# Patient Record
Sex: Male | Born: 1945
Health system: Southern US, Community
[De-identification: ages and names within clinical notes are randomized; demographics above are authoritative.]

## PROBLEM LIST (undated history)

## (undated) DIAGNOSIS — I10 Essential (primary) hypertension: Secondary | ICD-10-CM

## (undated) DIAGNOSIS — M1990 Unspecified osteoarthritis, unspecified site: Secondary | ICD-10-CM

## (undated) DIAGNOSIS — H2510 Age-related nuclear cataract, unspecified eye: Secondary | ICD-10-CM

## (undated) DIAGNOSIS — I38 Endocarditis, valve unspecified: Secondary | ICD-10-CM

## (undated) DIAGNOSIS — I2510 Atherosclerotic heart disease of native coronary artery without angina pectoris: Secondary | ICD-10-CM

## (undated) DIAGNOSIS — E079 Disorder of thyroid, unspecified: Secondary | ICD-10-CM

## (undated) DIAGNOSIS — C73 Malignant neoplasm of thyroid gland: Secondary | ICD-10-CM

## (undated) DIAGNOSIS — Z85850 Personal history of malignant neoplasm of thyroid: Secondary | ICD-10-CM

## (undated) DIAGNOSIS — K219 Gastro-esophageal reflux disease without esophagitis: Secondary | ICD-10-CM

## (undated) DIAGNOSIS — N400 Enlarged prostate without lower urinary tract symptoms: Secondary | ICD-10-CM

## (undated) DIAGNOSIS — E039 Hypothyroidism, unspecified: Secondary | ICD-10-CM

## (undated) DIAGNOSIS — D34 Benign neoplasm of thyroid gland: Secondary | ICD-10-CM

## (undated) DIAGNOSIS — H33329 Round hole, unspecified eye: Secondary | ICD-10-CM

## (undated) DIAGNOSIS — C801 Malignant (primary) neoplasm, unspecified: Secondary | ICD-10-CM

## (undated) DIAGNOSIS — E785 Hyperlipidemia, unspecified: Secondary | ICD-10-CM

## (undated) DIAGNOSIS — K5790 Diverticulosis of intestine, part unspecified, without perforation or abscess without bleeding: Secondary | ICD-10-CM

## (undated) DIAGNOSIS — R51 Headache: Secondary | ICD-10-CM

## (undated) HISTORY — PX: KNEE ARTHROSCOPY: SHX127

## (undated) HISTORY — PX: BACK SURGERY: SHX140

## (undated) HISTORY — PX: LUMBAR MICRODISCECTOMY: SHX99

## (undated) HISTORY — PX: JOINT REPLACEMENT: SHX530

## (undated) HISTORY — PX: TOTAL THYROIDECTOMY: SHX2547

## (undated) HISTORY — PX: TURP VAPORIZATION: SUR1397

## (undated) HISTORY — PX: ANGIOPLASTY / STENTING FEMORAL: SUR30

---

## 2004-03-31 ENCOUNTER — Other Ambulatory Visit: Payer: Self-pay

## 2005-03-30 ENCOUNTER — Ambulatory Visit: Payer: Self-pay

## 2006-04-06 ENCOUNTER — Emergency Department: Payer: Self-pay | Admitting: Internal Medicine

## 2006-04-13 ENCOUNTER — Encounter: Admission: RE | Admit: 2006-04-13 | Discharge: 2006-04-13 | Payer: Self-pay | Admitting: Internal Medicine

## 2006-09-01 ENCOUNTER — Ambulatory Visit: Payer: Self-pay | Admitting: Internal Medicine

## 2007-02-03 ENCOUNTER — Ambulatory Visit: Payer: Self-pay | Admitting: Internal Medicine

## 2007-02-11 ENCOUNTER — Ambulatory Visit: Payer: Self-pay | Admitting: Gastroenterology

## 2007-02-22 ENCOUNTER — Ambulatory Visit: Payer: Self-pay | Admitting: Gastroenterology

## 2007-03-21 ENCOUNTER — Ambulatory Visit: Payer: Self-pay | Admitting: Surgery

## 2007-09-05 ENCOUNTER — Ambulatory Visit: Payer: Self-pay | Admitting: Unknown Physician Specialty

## 2008-04-23 ENCOUNTER — Ambulatory Visit: Payer: Self-pay | Admitting: Internal Medicine

## 2008-05-26 ENCOUNTER — Emergency Department: Payer: Self-pay | Admitting: Emergency Medicine

## 2010-06-22 ENCOUNTER — Emergency Department: Payer: Self-pay | Admitting: Emergency Medicine

## 2010-07-13 ENCOUNTER — Ambulatory Visit: Payer: Self-pay | Admitting: Unknown Physician Specialty

## 2010-07-15 ENCOUNTER — Inpatient Hospital Stay: Payer: Self-pay | Admitting: Unknown Physician Specialty

## 2011-01-03 ENCOUNTER — Encounter: Payer: Self-pay | Admitting: Internal Medicine

## 2011-03-02 ENCOUNTER — Ambulatory Visit: Payer: Self-pay | Admitting: Unknown Physician Specialty

## 2012-04-11 ENCOUNTER — Ambulatory Visit: Payer: Self-pay | Admitting: Orthopedic Surgery

## 2012-08-04 ENCOUNTER — Encounter (HOSPITAL_COMMUNITY): Payer: Self-pay | Admitting: Pharmacy Technician

## 2012-08-07 ENCOUNTER — Encounter (HOSPITAL_COMMUNITY)
Admission: RE | Admit: 2012-08-07 | Discharge: 2012-08-07 | Disposition: A | Discharge status: Home / Self Care | Payer: Medicare Other | Source: Ambulatory Visit | Attending: Physician Assistant | Admitting: Physician Assistant

## 2012-08-07 ENCOUNTER — Encounter (HOSPITAL_COMMUNITY): Payer: Self-pay

## 2012-08-07 ENCOUNTER — Encounter (HOSPITAL_COMMUNITY)
Admission: RE | Admit: 2012-08-07 | Discharge: 2012-08-07 | Disposition: A | Discharge status: Home / Self Care | Payer: Medicare Other | Source: Ambulatory Visit | Attending: Orthopedic Surgery | Admitting: Orthopedic Surgery

## 2012-08-07 DIAGNOSIS — M48061 Spinal stenosis, lumbar region without neurogenic claudication: Secondary | ICD-10-CM | POA: Diagnosis present

## 2012-08-07 HISTORY — DX: Headache: R51

## 2012-08-07 HISTORY — DX: Essential (primary) hypertension: I10

## 2012-08-07 HISTORY — DX: Malignant (primary) neoplasm, unspecified: C80.1

## 2012-08-07 HISTORY — DX: Hypothyroidism, unspecified: E03.9

## 2012-08-07 LAB — SURGICAL PCR SCREEN
MRSA, PCR: NEGATIVE
Staphylococcus aureus: NEGATIVE

## 2012-08-07 LAB — BASIC METABOLIC PANEL
BUN: 21 mg/dL (ref 6–23)
Calcium: 9.6 mg/dL (ref 8.4–10.5)
Creatinine, Ser: 1.24 mg/dL (ref 0.50–1.35)
GFR calc non Af Amer: 58 mL/min — ABNORMAL LOW (ref >90)
Glucose, Bld: 80 mg/dL (ref 70–99)

## 2012-08-07 LAB — CBC
Hemoglobin: 13.8 g/dL (ref 13.0–17.0)
MCH: 28.8 pg (ref 26.0–34.0)
MCHC: 33.8 g/dL (ref 30.0–36.0)
Platelets: 181 10*3/uL (ref 150–400)

## 2012-08-07 LAB — ABO/RH: ABO/RH(D): O POS

## 2012-08-07 LAB — TYPE AND SCREEN: ABO/RH(D): O POS

## 2012-08-07 NOTE — Progress Notes (Addendum)
Spoke with Sherri in Dr. Shon Baton' office requesting surgical orders.   Requested EKG, clearance note from [facility], Internal Med, [place].

## 2012-08-07 NOTE — Consult Note (Signed)
Keith Williamson 08/07/2012 8:24 AM Location: [facility] Patient #: [id number] DOB: [birth year] Married / Language: English / Race: Black or African American Male   History of Present Illness(Anokhi Shannon Dierdre Highman, PA-C; 08/07/2012 10:06 AM) The patient is a 66 year old male who comes in today for a preoperative History and Physical. The patient is scheduled for a revision decompression and insitu fusion L3-5 (recurrent spinal stenosis with L>R leg pain) to be performed by Dr. Debria Garret D. Shon Baton, MD at [facility] on Wednesday, August 16, 2012 at 0830 . Please see the hospital record for complete dictated history and physical.    Family History(Oretha Weismann J Pavan Bring, PA-C; 08/07/2012 2:08 PM) Cancer. sister father and sister Hypertension. mother, sister and brother Kidney disease. sister and brother   Social History(Tyjai Charbonnet Dierdre Highman, PA-C; 08/07/2012 2:08 PM) Alcohol use. never consumed alcohol Children. 2 Current work status. disabled Drug/Alcohol Rehab (Currently). no Drug/Alcohol Rehab (Previously). no Illicit drug use. no Living situation. live with spouse Marital status. married Pain Contract. no Tobacco / smoke exposure. no Tobacco use. former smoker; smoke(d) less than 1/2 pack(s) per day   Medication History(Leng Montesdeoca J Brandyn Thien, PA-C; 08/07/2012 11:08 AM) Doxazosin Mesylate (4MG  Tablet, 1 Oral bid) Active. (blood pressure) Levothyroxine Sodium ( Tablet, 1 Oral daily) Active. Proscar (5MG  Tablet, 1 Oral daily) Active. Edarbi (40MG  Tablet, 1 Oral daily) Active. (blood pressure) Oxycodone-Acetaminophen (5-325MG  Tablet, 1 Oral daily) Active.   Past Surgical History(Kerron Sedano J [facility], PA-C; 08/07/2012 2:08 PM) Arthroscopy of Knee. left Spinal Surgery Thyroidectomy; Total   Other Problems(Maurilio Puryear J Martino Tompson, PA-C; 08/07/2012 2:08 PM) Autoimmune disorder. sister High blood pressure   Review of Systems(Masae Lukacs J Devaney Segers, PA-C; 08/07/2012 2:08  PM) General:Not Present- Chills, Fever, Night Sweats, Appetite Loss, Fatigue, Feeling sick, Weight Gain and Weight Loss. Skin:Not Present- Itching, Rash, Skin Color Changes, Ulcer, Psoriasis and Change in Hair or Nails. HEENT:Present- Hearing problems and Ringing in the Ears. Not Present- Sensitivity to light and Nose Bleed. Neck:Not Present- Swollen Glands and Neck Mass. Respiratory:Not Present- Snoring, Chronic Cough, Bloody sputum and Dyspnea. Cardiovascular:Not Present- Shortness of Breath, Chest Pain, Swelling of Extremities, Leg Cramps and Palpitations. Gastrointestinal:Not Present- Bloody Stool, Heartburn, Abdominal Pain, Vomiting, Nausea and Incontinence of Stool. Male Genitourinary:Not Present- Blood in Urine, Frequency, Incontinence and Nocturia. Musculoskeletal:Present- Joint Pain and Back Pain. Not Present- Muscle Weakness, Muscle Pain, Joint Stiffness and Joint Swelling. Neurological:Present- Numbness and Burning. Not Present- Tingling, Tremor, Headaches and Dizziness. Psychiatric:Not Present- Anxiety, Depression and Memory Loss. Endocrine:Present- Cold Intolerance and Heat Intolerance. Not Present- Excessive hunger and Excessive Thirst. Hematology:Not Present- Abnormal Bleeding, Anemia, Blood Clots and Easy Bruising.   Vitals(Amatullah Christy J Sibley Rolison, PA-C; 08/07/2012 11:03 AM) 08/07/2012 11:03 AM BP: 160/100 (Sitting, Left Arm, Standard)  08/07/2012 10:35 AM Weight: 243 lb Height: 74 in Body Surface Area: 2.4 m Body Mass Index: 31.2 kg/m  08/07/2012 10:32 AM Pulse: 93 (Regular) BP: 151/116 (Sitting, Left Arm, Standard)    Physical Exam(Mahdiya Mossberg J Dennys Guin, PA-C; 08/07/2012 9:52 AM) The physical exam findings are as follows:   General General Appearance- pleasant. Not in acute distress. Orientation- Oriented X3. Build & Nutrition- Well nourished and Well developed. Posture- Normal posture. Gait- Normal (slight limp). Mental Status-  Alert.   Integumentary Lumbar Spine- Skin examination of the lumbar spine is without deformity, skin lesions, lacerations or abrasions (well healed surgical scar midline).   Head and Neck Neck Global Assessment- supple. no lymphadenopathy and no nucchal rigidty.   Eye Pupil- Bilateral- Normal, Direct reaction to light normal, Equal and  Regular. Motion- Bilateral- EOMI.   Chest and Lung Exam Auscultation: Breath sounds:- Clear.   Cardiovascular Auscultation:Rhythm- Regular rate and rhythm. Heart Sounds- Normal heart sounds.   Peripheral Vascular Lower Extremity:Inspection- Bilateral- Inspection Normal. Palpation:Posterior tibial pulse- Bilateral- 2+. Dorsalis pedis pulse- Bilateral- 2+.   Neurologic Sensation:Lower Extremity- Bilateral- sensation is intact in the lower extremity. Reflexes:Patellar Reflex- Bilateral- 1+. Achilles Reflex- Bilateral- 1+. Babinski- Bilateral- Babinski not present. Clonus- Bilateral- clonus not present. Testing:Seated Straight Leg Raise- Bilateral- Seated straight leg raise negative.   Musculoskeletal Spine/Ribs/Pelvis Lumbosacral Spine:Inspection and Palpation- Tenderness- generalized. bony and soft tissue palpation of the lumbar spine and SI joint does not recreate their typical pain. Strength and Tone: Strength:Hip Flexion- Bilateral- 5/5. Knee Extension- Bilateral- 5/5. Knee Flexion- Bilateral- 5/5. Ankle Dorsiflexion- Bilateral- 5/5. Ankle Plantarflexion- Bilateral- 5/5. Heel walk- Bilateral- able to heel walk without difficulty. Toe Walk- Bilateral- able to walk on toes without difficulty. Heel-Toe Walk- Bilateral- able to heel-toe walk without difficulty. ROM- Flexion- full range of motion. Extension- full range of motion. Pain:- extension is more painful than flexion. Waddell's Signs- no Waddell's signs present. Lower Extremity Range of Motion:- No true hip, knee or  ankle pain with range of motion. Gait and Station:Assistive Devices- no assistive devices.   Assessment & Plan(Trayton Szabo J [facility], PA-C; 08/07/2012 2:09 PM) Spinal Stenosis, Lumbar (724.02)  Note: unfortunately conservative measures consisting of observation, activity modification, oral pain medications and injections have failed to alleviate his symptoms and given the ongoing nature of his pain and the significant decrease in his overall quality of life, he wishes to proceed with surgery. Risks/benefits/alternatives to the procedure/expectations following the procedure have been reviewed with him by Dr. Shon Baton. The goal of surgery is to reduce, not eliminate his pain. He understands and accepts.  He has been medically cleared for the procedure by Dr. Marcello Fennel. Please see the scanned documentation in th e patients office chart. He is scheduled to complete his preop hospital requirements later this morning at [facility]. He will also be fitted for a lumbar corsett by out therapy department later today.  MRI of the lumbar spine demonstrates multifactorial multilevel spinal stenosis at both L3-4 and L4-5. Please see the report for the specifics. All of his questions have been encouraged, addressed and answered. Plan, at this time is to proceed with surgery as scheduled.  patients last BP at his PCP on 07/12/12 was 132/80. This morning it is elevated but by his report is under control with the current medication regimen. Dr. Shon Baton has asked that the patient contact his PCP to inform him of the elevation this morning.  I was contacted by the hospital RE: the patients blood pressure and they report that it is in normal range this afternoon.   Signed electronically by Gwinda Maine, PA-C (08/07/2012 2:09 PM)   Keith Williamson 07/07/2012 Location: [facility] Patient #: [phone] DOB: [birth year] Married / Language: English / Race: Black or African American Male  The patient  is a 66 year old Male.   Subjective Transcription(DAHARI Sheela Stack, MD; 07/12/2012 5:14 PM)  He called today indicating he would like to proceed with surgery. At this point in time because of the need for the in situ fusion and the fact that it is a 2 level procedure and his age, I do favor also using an external bone stimulator to augment likely the fusion. We will go ahead and submit the paperwork for his insurance company and will make sure that he has clearance from  primary care physician.      Miscellaneous Transcription(DAHARI Sheela Stack, MD; 07/12/2012 5:14 PM)  Keith Beal, MD/ghe    T: 07/10/12  D: 07/07/12      Signed electronically by Keith Beal, MD (07/12/2012 5:14 PM)   Keith Williamson 06/13/2012 10:02 AM Location: [facility] Patient #: [id number] DOB: 1945-12-21 Married / Language: English / Race: Black or African American Male   History of Present Illness(Lori Zipporah Plants; 06/13/2012 10:17 AM) The patient is a 66 year old male who presents with back pain. The patient is here today in referral from Primary at [facility] . The patient reports low back symptoms including pain and numbness which began 3 year(s) (+) ago without any known injury (diagnosised with spinal stenosis ). and Symptoms include pain (about the lower lumbar region ), numbness (about the left lower ext. from the level of the knee to ankle, medially), burning and weakness (left lower ext. ), while symptoms do not include incontinence of stool or incontinence of urine. Current treatment includes opioid analgesics (Duragesic patch per primary ), epidural injections (he has had a series of three, with the last one occuring in [phone] (no relief)) and activity modification. Past evaluation has included x-ray of the lumbar spine, MRI of the lumbar spine (performed at [facility] 03/2012), orthopedic evaluation and pain management evaluation. Past treatment has included  back surgery (left L3-4 disectomy on [id number]).    Subjective Transcription(DAHARI D BROOKS, MD; 06/16/2012 8:47 AM)  REASON FOR CONSULTATION:  Ongoing significant back and bilateral leg pain, left side worse than the right.    HISTORY OF PRESENT ILLNESS:  The patient is a very pleasant, active young man who has been having progressive debilitating back, buttock, bilateral leg pain, left worse than right for at least the last 5 years. In 2011, he had an L3-4 microdiscectomy on the left side. He states that his symptoms never really changed. He continues to have debilitating back pain which effects his ability to walk as well as radiating into the legs. He states there is a significant amount of crepitus and shooting radicular leg pain, left side worse than right. He also has significant left knee arthritis which compounds his problem by effecting his gait. He ambulates with a cane. He still has relief of symptoms with forward flexion and increased pain with extension. He has no history of incontinence of bowel and bladder.    A recent MRI done 04/11/12 demonstrates a slight anterolisthesis at L4-5 with significant spinal stenosis at L4-5 with facet hypertrophy causing lateral recess and foraminal disease as well as hard disk osteophyte and buckling of the ligamentum flavum causing central stenosis. He has a recurrent disk herniation at L3-4 on the left side with moderate to severe spinal stenosis at L3-4.      Allergies(Lori W Randa Lynn; 06/13/2012 10:17 AM) Norvasc *CALCIUM CHANNEL BLOCKERS*. intolerance Hydrochlorothiazide *DIURETICS*. intolerance   Social History(Lori W Lamb; 06/13/2012 10:17 AM) Alcohol use. never consumed alcohol Children. 2 Current work status. disabled Drug/Alcohol Rehab (Currently). no Drug/Alcohol Rehab (Previously). no Exercise. Exercises weekly; does other Illicit drug use. no Living situation. live with spouse Marital status. married Number  of flights of stairs before winded. 4-5 Pain Contract. no Tobacco / smoke exposure. no Tobacco use. former smoker; smoke(d) less than 1/2 pack(s) per day   Medication History(Lori W Lamb; 06/13/2012 10:18 AM) Robaxin (500MG  Tablet, 1 Oral po q8h prn spasms, Taken starting 06/09/2012) Active. Levothyroxine Sodium ( Oral) Specific dose unknown -  Active. Doxazosin Mesylate ( Oral) Specific dose unknown - Active. Edarbi ( Oral) Specific dose unknown - Active. Proscar (5MG  Tablet, Oral) Active. FentaNYL Citrate ( Buccal) Specific dose unknown - Active.   Past Surgical History(Lori W Lamb; 06/13/2012 10:18 AM) Arthroscopy of Knee. left Spinal Surgery Thyroidectomy; Total   Objective Transcription(DAHARI Sheela Stack, MD; 06/16/2012 8:47 AM)  He is a pleasant gentleman who appears his stated age in no acute distress. He is alert and oriented x3. He has downgoing toes on Babinski testing. Decreased reflexes at the knee and Achilles bilaterally. Compartments are soft and nontender. Intact peripheral pulses. He does have an altered gait pattern with a slight limp. He has knee pain with palpation and range of motion. Back pain with extension. He has a well healed surgical scar in the midline of the lumbar spine. No groin pain (hip pain). No shortness of breath or chest pain.      Assessment & Plan(DAHARI D BROOKS, MD; 06/13/2012 11:09 AM) Post-laminectomy Syndrome, Lumbar (722.83)  Spinal Stenosis, Lumbar (724.02)   Assessments Transcription(DAHARI D BROOKS, MD; 06/16/2012 8:47 AM)  Lumbar spinal stenosis with recurrent disk herniation.      Plans Transcription(DAHARI Sheela Stack, MD; 06/16/2012 8:47 AM)  At this point in time, we have had a long discussion about surgical intervention. If we were to do a surgery, I would recommend a revision, decompression L3-4, L4-5 with possible in situ fusion L4-5. At this point, there is degenerative disk disease at L5-S1, so I do not  favor instrumentation at the L4-5 level as this would augment stress transfer to the L5-S1 and more than likely create a symptomatic degenerative disk at L5-S1. I do believe that his predominant symptoms are consistent with spinal stenosis. By doing a decompression at L4-5 and L3-4, this would allow me to address the recurrent disk fragment at L3-4, the significant facet hypertrophy and stenosis at L3-4 and L4-5, create enough room for the nerves that his stenotic symptoms should improve. The risk of that surgery has been explained to the patient and his wife to include infection, bleeding, nerve damage, death, stroke, paralysis, ongoing or worse pain, adjacent segment disease, leak of spinal fluid, injury to the bowel and bladder and abdominal contents, and loss of bowel and bladder control. All of their questions have been addressed and encouraged. He will take some time to think about it and he will contact me and let me know how he would like to proceed.      Miscellaneous Transcription(DAHARI Sheela Stack, MD; 06/16/2012 8:47 AM)  Keith Beal, MD/ghe    T: 06/14/12  D: 06/13/12      Signed electronically by Keith Beal, MD (06/16/2012 8:58 AM)

## 2012-08-07 NOTE — Pre-Procedure Instructions (Signed)
19 Keith Williamson  08/07/2012   Your procedure is scheduled on:  Wednesday September 4  Report to [facility] at 6:30 AM.  Call this number if you have problems the morning of surgery: [phone]   Remember:   Do not eat or drink:After Midnight.    Take these medicines the morning of surgery with A SIP OF WATER: Synthroid, Proscar, Cardura   Do not wear jewelry, make-up or nail polish.  Do not wear lotions, powders, or perfumes. You may wear deodorant.  Do not shave 48 hours prior to surgery. Men may shave face and neck.  Do not bring valuables to the hospital.  Contacts, dentures or bridgework may not be worn into surgery.  Leave suitcase in the car. After surgery it may be brought to your room.  For patients admitted to the hospital, checkout time is 11:00 AM the day of discharge.   Patients discharged the day of surgery will not be allowed to drive home.  Name and phone number of your driver: NA  Special Instructions: CHG Shower Use Special Wash: 1/2 bottle night before surgery and 1/2 bottle morning of surgery.   Please read over the following fact sheets that you were given: Pain Booklet, Coughing and Deep Breathing and Surgical Site Infection Prevention

## 2012-08-08 NOTE — Consult Note (Signed)
Anesthesia Chart Review:  Patient is a 66 year old male scheduled for revision decompression and insitu fusion L 3-4 on 08/16/12 by Dr. Shon Baton.  History includes former smoker, obesity, HTN, BPH s/p TURP, headaches, thyroid cancer '07 s/p radioactive iodine with secondary hypothyroidism, prior back and knee surgeries.    PCP is Dr. Marcello Fennel at [facility] on 07/12/12 for medical clearance.  EKG then showed SR with first degree AVB, LAD.  He was ultimately cleared for this procedure with "low" risk of peri-operative complications.  By notes, patient had a relatively benign cardiac work-up in '05 following a syncopal episode.  EKG then showed SB that resolved with discontinuation of b-blocker therapy, Holter showed occasional PVC/PACs, stress was negative, and echo showed LVH.  Chest x-ray on 08/07/2012 showed no acute cardiopulmonary abnormality.  Labs noted.  If no significant change in his status then anticipate he can proceed as planned.  Shonna Chock, PA-C

## 2012-08-09 ENCOUNTER — Other Ambulatory Visit (HOSPITAL_COMMUNITY): Payer: Self-pay

## 2012-08-15 MED ORDER — CEFAZOLIN SODIUM-DEXTROSE 2-3 GM-% IV SOLR
2.0000 g | INTRAVENOUS | Status: AC
  Administered 2012-08-16: 1 g via INTRAVENOUS
  Administered 2012-08-16: 2 g via INTRAVENOUS
  Filled 2012-08-15: qty 50

## 2012-08-16 ENCOUNTER — Ambulatory Visit (HOSPITAL_COMMUNITY): Payer: Medicare Other

## 2012-08-16 ENCOUNTER — Encounter (HOSPITAL_COMMUNITY): Payer: Self-pay | Admitting: *Deleted

## 2012-08-16 ENCOUNTER — Ambulatory Visit (HOSPITAL_COMMUNITY): Payer: Medicare Other | Admitting: Vascular Surgery

## 2012-08-16 ENCOUNTER — Encounter (HOSPITAL_COMMUNITY)
Admission: RE | Disposition: A | Discharge status: Home / Self Care | Payer: Self-pay | Source: Ambulatory Visit | Attending: Orthopedic Surgery

## 2012-08-16 ENCOUNTER — Inpatient Hospital Stay (HOSPITAL_COMMUNITY)
Admission: RE | Admit: 2012-08-16 | Discharge: 2012-08-19 | DRG: 460 | Disposition: A | Discharge status: Home / Self Care | Payer: Medicare Other | Source: Ambulatory Visit | Attending: Orthopedic Surgery | Admitting: Orthopedic Surgery

## 2012-08-16 ENCOUNTER — Encounter (HOSPITAL_COMMUNITY): Payer: Self-pay | Admitting: Vascular Surgery

## 2012-08-16 DIAGNOSIS — E018 Other iodine-deficiency related thyroid disorders and allied conditions: Secondary | ICD-10-CM | POA: Diagnosis present

## 2012-08-16 DIAGNOSIS — Z79899 Other long term (current) drug therapy: Secondary | ICD-10-CM

## 2012-08-16 DIAGNOSIS — Z85850 Personal history of malignant neoplasm of thyroid: Secondary | ICD-10-CM

## 2012-08-16 DIAGNOSIS — M5126 Other intervertebral disc displacement, lumbar region: Principal | ICD-10-CM | POA: Diagnosis present

## 2012-08-16 DIAGNOSIS — I10 Essential (primary) hypertension: Secondary | ICD-10-CM | POA: Diagnosis present

## 2012-08-16 DIAGNOSIS — M48061 Spinal stenosis, lumbar region without neurogenic claudication: Secondary | ICD-10-CM

## 2012-08-16 DIAGNOSIS — Z01812 Encounter for preprocedural laboratory examination: Secondary | ICD-10-CM

## 2012-08-16 DIAGNOSIS — Z01818 Encounter for other preprocedural examination: Secondary | ICD-10-CM

## 2012-08-16 DIAGNOSIS — Q762 Congenital spondylolisthesis: Secondary | ICD-10-CM

## 2012-08-16 HISTORY — PX: LUMBAR LAMINECTOMY: SHX95

## 2012-08-16 LAB — COMPREHENSIVE METABOLIC PANEL
ALT: 20 U/L (ref 0–53)
BUN: 22 mg/dL (ref 6–23)
Calcium: 9.5 mg/dL (ref 8.4–10.5)
Creatinine, Ser: 1.45 mg/dL — ABNORMAL HIGH (ref 0.50–1.35)
GFR calc Af Amer: 56 mL/min — ABNORMAL LOW (ref >90)
GFR calc non Af Amer: 48 mL/min — ABNORMAL LOW (ref >90)
Glucose, Bld: 101 mg/dL — ABNORMAL HIGH (ref 70–99)
Sodium: 141 mEq/L (ref 135–145)
Total Protein: 7.3 g/dL (ref 6.0–8.3)

## 2012-08-16 LAB — PROTIME-INR
INR: 1.07 (ref 0.00–1.49)
Prothrombin Time: 14.1 seconds (ref 11.6–15.2)

## 2012-08-16 SURGERY — DECOMPRESSIVE LUMBAR LAMINECTOMY LEVEL 3
Anesthesia: General | Site: Back | Wound class: Clean

## 2012-08-16 MED ORDER — PHENOL 1.4 % MT LIQD: 1.0000 | OROMUCOSAL | Status: DC | PRN

## 2012-08-16 MED ORDER — ONDANSETRON HCL 4 MG/2ML IJ SOLN: 4.0000 mg | INTRAMUSCULAR | Status: DC | PRN

## 2012-08-16 MED ORDER — DOXAZOSIN MESYLATE 4 MG PO TABS
4.0000 mg | ORAL_TABLET | Freq: Two times a day (BID) | ORAL | Status: DC
Start: 2012-08-16 — End: 2012-08-19
  Administered 2012-08-16 – 2012-08-19 (×6): 4 mg via ORAL
  Filled 2012-08-16 (×8): qty 1

## 2012-08-16 MED ORDER — ROCURONIUM BROMIDE 100 MG/10ML IV SOLN
INTRAVENOUS | Status: DC | PRN
  Administered 2012-08-16: 70 mg via INTRAVENOUS

## 2012-08-16 MED ORDER — ACETAMINOPHEN 10 MG/ML IV SOLN
INTRAVENOUS | Status: AC
  Filled 2012-08-16: qty 100

## 2012-08-16 MED ORDER — BUPIVACAINE-EPINEPHRINE PF 0.25-1:200000 % IJ SOLN
INTRAMUSCULAR | Status: DC | PRN
  Administered 2012-08-16: 10 mL

## 2012-08-16 MED ORDER — HYDROMORPHONE HCL PF 1 MG/ML IJ SOLN: 0.2500 mg | INTRAMUSCULAR | Status: DC | PRN

## 2012-08-16 MED ORDER — SODIUM CHLORIDE 0.9 % IV SOLN
INTRAVENOUS | Status: DC | PRN
  Administered 2012-08-16: 13:00:00 via INTRAVENOUS

## 2012-08-16 MED ORDER — HEMOSTATIC AGENTS (NO CHARGE) OPTIME
TOPICAL | Status: DC | PRN
  Administered 2012-08-16: 1 via TOPICAL

## 2012-08-16 MED ORDER — CEFAZOLIN SODIUM 1-5 GM-% IV SOLN
1.0000 g | Freq: Three times a day (TID) | INTRAVENOUS | Status: AC
  Administered 2012-08-16 – 2012-08-17 (×2): 1 g via INTRAVENOUS
  Filled 2012-08-16 (×3): qty 50

## 2012-08-16 MED ORDER — OXYCODONE HCL 5 MG PO TABS
10.0000 mg | ORAL_TABLET | ORAL | Status: DC | PRN
  Administered 2012-08-16 – 2012-08-19 (×5): 10 mg via ORAL
  Filled 2012-08-16: qty 2
  Filled 2012-08-16 (×2): qty 1
  Filled 2012-08-16 (×2): qty 2

## 2012-08-16 MED ORDER — MENTHOL 3 MG MT LOZG: 1.0000 | LOZENGE | OROMUCOSAL | Status: DC | PRN

## 2012-08-16 MED ORDER — ACETAMINOPHEN 10 MG/ML IV SOLN
1000.0000 mg | Freq: Once | INTRAVENOUS | Status: AC
  Filled 2012-08-16: qty 100

## 2012-08-16 MED ORDER — EPHEDRINE SULFATE 50 MG/ML IJ SOLN
INTRAMUSCULAR | Status: DC | PRN
  Administered 2012-08-16 (×2): 5 mg via INTRAVENOUS

## 2012-08-16 MED ORDER — SODIUM CHLORIDE 0.9 % IJ SOLN: 3.0000 mL | INTRAMUSCULAR | Status: DC | PRN

## 2012-08-16 MED ORDER — ONDANSETRON HCL 4 MG/2ML IJ SOLN: 4.0000 mg | Freq: Four times a day (QID) | INTRAMUSCULAR | Status: DC | PRN

## 2012-08-16 MED ORDER — BUPIVACAINE-EPINEPHRINE PF 0.25-1:200000 % IJ SOLN
INTRAMUSCULAR | Status: AC
  Filled 2012-08-16: qty 30

## 2012-08-16 MED ORDER — LACTATED RINGERS IV SOLN
INTRAVENOUS | Status: DC
  Administered 2012-08-16: 19:00:00 via INTRAVENOUS

## 2012-08-16 MED ORDER — NON FORMULARY
40.0000 mg | Freq: Every day | Status: DC
  Administered 2012-08-17: 40 mg via ORAL

## 2012-08-16 MED ORDER — VANCOMYCIN HCL 500 MG IV SOLR
INTRAVENOUS | Status: DC | PRN
  Administered 2012-08-16: 500 mg

## 2012-08-16 MED ORDER — HYDROMORPHONE 0.3 MG/ML IV SOLN
INTRAVENOUS | Status: AC
  Filled 2012-08-16: qty 25

## 2012-08-16 MED ORDER — METHOCARBAMOL 100 MG/ML IJ SOLN
500.0000 mg | Freq: Four times a day (QID) | INTRAVENOUS | Status: DC | PRN
  Filled 2012-08-16: qty 5

## 2012-08-16 MED ORDER — FLEET ENEMA 7-19 GM/118ML RE ENEM: 1.0000 | ENEMA | Freq: Once | RECTAL | Status: AC | PRN

## 2012-08-16 MED ORDER — HEPARIN SODIUM (PORCINE) 1000 UNIT/ML IJ SOLN
INTRAMUSCULAR | Status: AC
Start: 2012-08-16 — End: 2012-08-16
  Filled 2012-08-16: qty 1

## 2012-08-16 MED ORDER — LACTATED RINGERS IV SOLN
INTRAVENOUS | Status: DC | PRN
  Administered 2012-08-16 (×4): via INTRAVENOUS

## 2012-08-16 MED ORDER — GLYCOPYRROLATE 0.2 MG/ML IJ SOLN
INTRAMUSCULAR | Status: DC | PRN
  Administered 2012-08-16: .1 mg via INTRAVENOUS
  Administered 2012-08-16: 0.2 mg via INTRAVENOUS
  Administered 2012-08-16: .6 mg via INTRAVENOUS

## 2012-08-16 MED ORDER — THROMBIN 20000 UNITS EX SOLR
CUTANEOUS | Status: AC
  Filled 2012-08-16: qty 20000

## 2012-08-16 MED ORDER — DEXAMETHASONE 4 MG PO TABS
4.0000 mg | ORAL_TABLET | Freq: Four times a day (QID) | ORAL | Status: DC
  Administered 2012-08-16 – 2012-08-19 (×10): 4 mg via ORAL
  Filled 2012-08-16 (×15): qty 1

## 2012-08-16 MED ORDER — MIDAZOLAM HCL 5 MG/5ML IJ SOLN
INTRAMUSCULAR | Status: DC | PRN
  Administered 2012-08-16: 2 mg via INTRAVENOUS

## 2012-08-16 MED ORDER — THROMBIN 20000 UNITS EX SOLR
OROMUCOSAL | Status: DC | PRN
  Administered 2012-08-16: 10:00:00 via TOPICAL

## 2012-08-16 MED ORDER — DIPHENHYDRAMINE HCL 50 MG/ML IJ SOLN
12.5000 mg | Freq: Four times a day (QID) | INTRAMUSCULAR | Status: DC | PRN
  Administered 2012-08-17: 12.5 mg via INTRAVENOUS
  Filled 2012-08-16: qty 1

## 2012-08-16 MED ORDER — METHOCARBAMOL 500 MG PO TABS
500.0000 mg | ORAL_TABLET | Freq: Four times a day (QID) | ORAL | Status: DC | PRN
  Administered 2012-08-16 – 2012-08-17 (×3): 500 mg via ORAL
  Filled 2012-08-16 (×3): qty 1

## 2012-08-16 MED ORDER — FINASTERIDE 5 MG PO TABS
5.0000 mg | ORAL_TABLET | Freq: Every day | ORAL | Status: DC
  Administered 2012-08-17 – 2012-08-19 (×3): 5 mg via ORAL
  Filled 2012-08-16 (×3): qty 1

## 2012-08-16 MED ORDER — ACETAMINOPHEN 10 MG/ML IV SOLN
1000.0000 mg | Freq: Four times a day (QID) | INTRAVENOUS | Status: AC
  Administered 2012-08-16 – 2012-08-17 (×4): 1000 mg via INTRAVENOUS
  Filled 2012-08-16 (×4): qty 100

## 2012-08-16 MED ORDER — ALBUMIN HUMAN 5 % IV SOLN
INTRAVENOUS | Status: DC | PRN
  Administered 2012-08-16 (×2): via INTRAVENOUS

## 2012-08-16 MED ORDER — 0.9 % SODIUM CHLORIDE (POUR BTL) OPTIME
TOPICAL | Status: DC | PRN
  Administered 2012-08-16: 1000 mL

## 2012-08-16 MED ORDER — VANCOMYCIN HCL 500 MG IV SOLR
INTRAVENOUS | Status: AC
  Filled 2012-08-16: qty 500

## 2012-08-16 MED ORDER — DOCUSATE SODIUM 100 MG PO CAPS
100.0000 mg | ORAL_CAPSULE | Freq: Two times a day (BID) | ORAL | Status: DC
  Administered 2012-08-16 – 2012-08-19 (×6): 100 mg via ORAL
  Filled 2012-08-16 (×9): qty 1

## 2012-08-16 MED ORDER — DEXAMETHASONE SODIUM PHOSPHATE 4 MG/ML IJ SOLN
4.0000 mg | Freq: Four times a day (QID) | INTRAMUSCULAR | Status: DC
  Administered 2012-08-17 (×2): 4 mg via INTRAVENOUS
  Filled 2012-08-16 (×15): qty 1

## 2012-08-16 MED ORDER — CEFAZOLIN SODIUM 1-5 GM-% IV SOLN
INTRAVENOUS | Status: AC
  Filled 2012-08-16: qty 50

## 2012-08-16 MED ORDER — ONDANSETRON HCL 4 MG/2ML IJ SOLN
INTRAMUSCULAR | Status: DC | PRN
  Administered 2012-08-16: 4 mg via INTRAVENOUS

## 2012-08-16 MED ORDER — OXYCODONE HCL 5 MG PO TABS
ORAL_TABLET | ORAL | Status: AC
  Filled 2012-08-16: qty 2

## 2012-08-16 MED ORDER — SUFENTANIL CITRATE 50 MCG/ML IV SOLN
INTRAVENOUS | Status: DC | PRN
  Administered 2012-08-16: 20 ug via INTRAVENOUS
  Administered 2012-08-16 (×2): 5 ug via INTRAVENOUS
  Administered 2012-08-16: 20 ug via INTRAVENOUS

## 2012-08-16 MED ORDER — NEOSTIGMINE METHYLSULFATE 1 MG/ML IJ SOLN
INTRAMUSCULAR | Status: DC | PRN
  Administered 2012-08-16: 5 mg via INTRAVENOUS

## 2012-08-16 MED ORDER — LEVOTHYROXINE SODIUM 137 MCG PO TABS
137.0000 ug | ORAL_TABLET | Freq: Every day | ORAL | Status: DC
  Administered 2012-08-17 – 2012-08-19 (×3): 137 ug via ORAL
  Filled 2012-08-16 (×4): qty 1

## 2012-08-16 MED ORDER — ZOLPIDEM TARTRATE 5 MG PO TABS: 5.0000 mg | ORAL_TABLET | Freq: Every evening | ORAL | Status: DC | PRN

## 2012-08-16 MED ORDER — SODIUM CHLORIDE 0.9 % IJ SOLN
3.0000 mL | Freq: Two times a day (BID) | INTRAMUSCULAR | Status: DC
  Administered 2012-08-16 – 2012-08-18 (×5): 3 mL via INTRAVENOUS

## 2012-08-16 MED ORDER — DIPHENHYDRAMINE HCL 12.5 MG/5ML PO ELIX: 12.5000 mg | ORAL_SOLUTION | Freq: Four times a day (QID) | ORAL | Status: DC | PRN

## 2012-08-16 MED ORDER — PHENYLEPHRINE HCL 10 MG/ML IJ SOLN
10.0000 mg | INTRAVENOUS | Status: DC | PRN
  Administered 2012-08-16: 11:00:00 via INTRAVENOUS
  Administered 2012-08-16: 40 ug/min via INTRAVENOUS

## 2012-08-16 MED ORDER — PROPOFOL 10 MG/ML IV EMUL
INTRAVENOUS | Status: DC | PRN
  Administered 2012-08-16: 200 mg via INTRAVENOUS

## 2012-08-16 MED ORDER — NALOXONE HCL 0.4 MG/ML IJ SOLN: 0.4000 mg | INTRAMUSCULAR | Status: DC | PRN

## 2012-08-16 MED ORDER — LACTATED RINGERS IV SOLN: INTRAVENOUS | Status: DC

## 2012-08-16 MED ORDER — HYDROMORPHONE 0.3 MG/ML IV SOLN
INTRAVENOUS | Status: DC
  Administered 2012-08-16: 15:00:00 via INTRAVENOUS
  Administered 2012-08-16: 0.3 mg via INTRAVENOUS
  Administered 2012-08-16 – 2012-08-17 (×3): 1.8 mg via INTRAVENOUS

## 2012-08-16 MED ORDER — SODIUM CHLORIDE 0.9 % IJ SOLN: 9.0000 mL | INTRAMUSCULAR | Status: DC | PRN

## 2012-08-16 MED ORDER — SODIUM CHLORIDE 0.9 % IV SOLN: 250.0000 mL | INTRAVENOUS | Status: DC

## 2012-08-16 MED ORDER — METHOCARBAMOL 500 MG PO TABS
ORAL_TABLET | ORAL | Status: AC
  Filled 2012-08-16: qty 1

## 2012-08-16 MED ORDER — LIDOCAINE HCL (CARDIAC) 20 MG/ML IV SOLN
INTRAVENOUS | Status: DC | PRN
  Administered 2012-08-16: 80 mg via INTRAVENOUS

## 2012-08-16 SURGICAL SUPPLY — 55 items
BANDAGE GAUZE ELAST BULKY 4 IN (GAUZE/BANDAGES/DRESSINGS) IMPLANT
BUR EGG ELITE 4.0 (BURR) ×3 IMPLANT
CLOTH BEACON ORANGE TIMEOUT ST (SAFETY) ×3 IMPLANT
CORDS BIPOLAR (ELECTRODE) ×3 IMPLANT
DRAIN SNY 10 ROU (WOUND CARE) ×3 IMPLANT
DRAPE C-ARM 42X72 X-RAY (DRAPES) IMPLANT
DRAPE ORTHO SPLIT 77X108 STRL (DRAPES)
DRAPE POUCH INSTRU U-SHP 10X18 (DRAPES) ×3 IMPLANT
DRAPE PROXIMA HALF (DRAPES) ×6 IMPLANT
DRAPE SURG 17X11 SM STRL (DRAPES) IMPLANT
DRAPE SURG ORHT 6 SPLT 77X108 (DRAPES) IMPLANT
DRAPE U-SHAPE 47X51 STRL (DRAPES) ×3 IMPLANT
DRSG ADAPTIC 3X8 NADH LF (GAUZE/BANDAGES/DRESSINGS) IMPLANT
DRSG EMULSION OIL 3X3 NADH (GAUZE/BANDAGES/DRESSINGS) IMPLANT
DRSG MEPILEX BORDER 4X4 (GAUZE/BANDAGES/DRESSINGS) ×3 IMPLANT
DRSG MEPILEX BORDER 4X8 (GAUZE/BANDAGES/DRESSINGS) ×3 IMPLANT
DURAPREP 26ML APPLICATOR (WOUND CARE) ×3 IMPLANT
ELECT BLADE 4.0 EZ CLEAN MEGAD (MISCELLANEOUS) ×3
ELECT CAUTERY BLADE 6.4 (BLADE) ×3 IMPLANT
ELECT REM PT RETURN 9FT ADLT (ELECTROSURGICAL) ×3
ELECTRODE BLDE 4.0 EZ CLN MEGD (MISCELLANEOUS) ×2 IMPLANT
ELECTRODE REM PT RTRN 9FT ADLT (ELECTROSURGICAL) ×2 IMPLANT
EVACUATOR SILICONE 100CC (DRAIN) ×3 IMPLANT
FLOSEAL (HEMOSTASIS) ×3 IMPLANT
GLOVE ECLIPSE 8.5 STRL (GLOVE) ×6 IMPLANT
GOWN STRL REIN XL XLG (GOWN DISPOSABLE) IMPLANT
KIT BASIN OR (CUSTOM PROCEDURE TRAY) ×3 IMPLANT
KIT STIMULAN RAPID CURE 5CC (Orthopedic Implant) ×3 IMPLANT
MANIFOLD NEPTUNE II (INSTRUMENTS) IMPLANT
NEEDLE 22X1 1/2 (OR ONLY) (NEEDLE) ×3 IMPLANT
NEEDLE SPNL 18GX3.5 QUINCKE PK (NEEDLE) ×6 IMPLANT
NS IRRIG 1000ML POUR BTL (IV SOLUTION) ×3 IMPLANT
PACK LAMINECTOMY ORTHO (CUSTOM PROCEDURE TRAY) ×3 IMPLANT
PACK UNIVERSAL I (CUSTOM PROCEDURE TRAY) ×3 IMPLANT
PATTIES SURGICAL .5 X.5 (GAUZE/BANDAGES/DRESSINGS) ×6 IMPLANT
PATTIES SURGICAL .5 X1 (DISPOSABLE) ×9 IMPLANT
SPONGE LAP 4X18 X RAY DECT (DISPOSABLE) ×12 IMPLANT
SPONGE SURGIFOAM ABS GEL 100 (HEMOSTASIS) ×3 IMPLANT
STAPLER VISISTAT 35W (STAPLE) IMPLANT
STRIP CLOSURE SKIN 1/2X4 (GAUZE/BANDAGES/DRESSINGS) IMPLANT
SUT ETHILON 2 0 FS 18 (SUTURE) ×3 IMPLANT
SUT MNCRL AB 3-0 PS2 18 (SUTURE) ×3 IMPLANT
SUT VIC AB 1 CT1 27 (SUTURE) ×8
SUT VIC AB 1 CT1 27XBRD ANBCTR (SUTURE) ×4 IMPLANT
SUT VIC AB 1 CT1 27XBRD ANTBC (SUTURE) ×4 IMPLANT
SUT VIC AB 2-0 CT1 18 (SUTURE) ×3 IMPLANT
SUT VIC AB 2-0 CT1 27 (SUTURE)
SUT VIC AB 2-0 CT1 TAPERPNT 27 (SUTURE) IMPLANT
SUT VICRYL 0 UR6 27IN ABS (SUTURE) IMPLANT
SYR BULB IRRIGATION 50ML (SYRINGE) ×3 IMPLANT
SYRINGE 10CC LL (SYRINGE) ×6 IMPLANT
TOWEL OR 17X26 10 PK STRL BLUE (TOWEL DISPOSABLE) ×6 IMPLANT
TRAY FOLEY CATH 14FR (SET/KITS/TRAYS/PACK) ×3 IMPLANT
WATER STERILE IRR 1000ML POUR (IV SOLUTION) ×3 IMPLANT
YANKAUER SUCT BULB TIP NO VENT (SUCTIONS) ×3 IMPLANT

## 2012-08-16 NOTE — Anesthesia Preprocedure Evaluation (Addendum)
Anesthesia Evaluation  Patient identified by MRN, date of birth, ID band Patient awake    Reviewed: Allergy & Precautions, H&P , NPO status , Patient's Chart, lab work & pertinent test results  History of Anesthesia Complications Negative for: history of anesthetic complications  Airway Mallampati: II      Dental  (+) Dental Advisory Given,    Pulmonary neg pulmonary ROS,  breath sounds clear to auscultation  Pulmonary exam normal       Cardiovascular hypertension, Pt. on medications Rhythm:Regular Rate:Normal     Neuro/Psych  Headaches, Lower back negative psych ROS   GI/Hepatic negative GI ROS, Neg liver ROS, (+)     substance abuse   ,   Endo/Other  Hypothyroidism   Renal/GU negative Renal ROS     Musculoskeletal   Abdominal   Peds  Hematology   Anesthesia Other Findings   Reproductive/Obstetrics                         Anesthesia Physical Anesthesia Plan  ASA: III  Anesthesia Plan: General   Post-op Pain Management:    Induction: Intravenous  Airway Management Planned: Oral ETT  Additional Equipment:   Intra-op Plan:   Post-operative Plan: Extubation in OR  Informed Consent: I have reviewed the patients History and Physical, chart, labs and discussed the procedure including the risks, benefits and alternatives for the proposed anesthesia with the patient or authorized representative who has indicated his/her understanding and acceptance.   Dental advisory given  Plan Discussed with: CRNA, Anesthesiologist and Surgeon  Anesthesia Plan Comments:        Anesthesia Quick Evaluation

## 2012-08-16 NOTE — Progress Notes (Signed)
UR COMPLETED  

## 2012-08-16 NOTE — Anesthesia Postprocedure Evaluation (Signed)
  Anesthesia Post-op Note  Patient: Keith Williamson  Procedure(s) Performed: Procedure(s) (LRB) with comments: DECOMPRESSIVE LUMBAR LAMINECTOMY LEVEL 3 (N/A) - with L3-4, 4-5 insitu fusion  Patient Location: PACU  Anesthesia Type: General  Level of Consciousness: awake  Airway and Oxygen Therapy: Patient Spontanous Breathing  Post-op Pain: mild  Post-op Assessment: Post-op Vital signs reviewed  Post-op Vital Signs: Reviewed  Complications: No apparent anesthesia complications

## 2012-08-16 NOTE — H&P (Signed)
H+P reviewed (listed as consult note) No change to clinical exam

## 2012-08-16 NOTE — Anesthesia Postprocedure Evaluation (Signed)
  Anesthesia Post-op Note  Patient: Keith Williamson  Procedure(s) Performed: Procedure(s) (LRB) with comments: DECOMPRESSIVE LUMBAR LAMINECTOMY LEVEL 3 (N/A) - with L3-4, 4-5 insitu fusion  Patient Location: PACU  Anesthesia Type: General  Level of Consciousness: awake  Airway and Oxygen Therapy: Patient Spontanous Breathing  Post-op Pain: mild  Post-op Assessment: Post-op Vital signs reviewed  Post-op Vital Signs: Reviewed  Complications: No apparent anesthesia complications 

## 2012-08-16 NOTE — Progress Notes (Signed)
Pt. States eye is "much better"

## 2012-08-16 NOTE — Progress Notes (Signed)
Pt. C/o "trash in his left eye". Eye is slightly red. Pt. Aware he was in a prone position intra-op, causing some peri-orbital edema/pressure. Anesthesia in PACU to assess.  Left eye flushed with saline once per anesthesia. Instructed to notify MD if there is no improvement.

## 2012-08-16 NOTE — Transfer of Care (Signed)
Immediate Anesthesia Transfer of Care Note  Patient: Esiah Bazinet  Procedure(s) Performed: Procedure(s) (LRB) with comments: DECOMPRESSIVE LUMBAR LAMINECTOMY LEVEL 3 (N/A) - with L3-4, 4-5 insitu fusion  Patient Location: PACU  Anesthesia Type: General  Level of Consciousness: awake, alert , sedated and patient cooperative  Airway & Oxygen Therapy: Patient Spontanous Breathing and Patient connected to nasal cannula oxygen  Post-op Assessment: Report given to PACU RN, Post -op Vital signs reviewed and stable, Patient moving all extremities and Patient moving all extremities X 4  Post vital signs: Reviewed and stable  Complications: No apparent anesthesia complications

## 2012-08-16 NOTE — Brief Op Note (Signed)
08/16/2012  1:54 PM  PATIENT:  Keith Williamson  66 y.o. male  PRE-OPERATIVE DIAGNOSIS:  RECURRENT SPINAL STENOSIS  POST-OPERATIVE DIAGNOSIS:  recurrent spinal stenosis   PROCEDURE:  Procedure(s) (LRB) with comments: DECOMPRESSIVE LUMBAR LAMINECTOMY LEVEL 3 (N/A) - with L3-4, 4-5 insitu fusion  SURGEON:  Surgeon(s) and Role:    * Venita Lick, MD - Primary  PHYSICIAN ASSISTANT:   ASSISTANTS: Norval Gable   ANESTHESIA:   general  EBL:  Total I/O In: 3750 [I.V.:3150; Blood:100; IV Piggyback:500] Out: 775 [Urine:375; Blood:400]  BLOOD ADMINISTERED:100 CC CELLSAVER  DRAINS: (1) Jackson-Pratt drain(s) with closed bulb suction in the back   LOCAL MEDICATIONS USED:  MARCAINE     SPECIMEN:  No Specimen  DISPOSITION OF SPECIMEN:  N/A  COUNTS:  YES  TOURNIQUET:  * No tourniquets in log *  DICTATION: .Other Dictation: Dictation Number [id number]  PLAN OF CARE: Admit to inpatient   PATIENT DISPOSITION:  PACU - hemodynamically stable.

## 2012-08-17 ENCOUNTER — Encounter (HOSPITAL_COMMUNITY): Payer: Self-pay | Admitting: General Practice

## 2012-08-17 MED ORDER — AZILSARTAN MEDOXOMIL 40 MG PO TABS
40.0000 mg | ORAL_TABLET | Freq: Every day | ORAL | Status: DC
  Administered 2012-08-18: 10:00:00 via ORAL
  Administered 2012-08-19: 40 mg via ORAL

## 2012-08-17 MED ORDER — POLYETHYLENE GLYCOL 3350 17 G PO PACK: 17.0000 g | PACK | Freq: Every day | ORAL | Status: DC

## 2012-08-17 MED ORDER — POLYETHYLENE GLYCOL 3350 17 G PO PACK
17.0000 g | PACK | Freq: Every day | ORAL | Status: DC
  Administered 2012-08-17 – 2012-08-18 (×2): 17 g via ORAL
  Filled 2012-08-17 (×3): qty 1

## 2012-08-17 NOTE — Op Note (Signed)
Keith Williamson, Keith Williamson             ACCOUNT NO.:  [id number]  MEDICAL RECORD NO.:  [id number]  LOCATION:  5N25C                        FACILITY:  [facility]  PHYSICIAN:  Alvy Beal, MD    DATE OF BIRTH:  1946-04-10  DATE OF PROCEDURE: DATE OF DISCHARGE:                              OPERATIVE REPORT   PREOPERATIVE DIAGNOSIS:  Recurrent lumbar spinal stenosis 4-5, 5-1 with recurrent lumbar disk herniation, L3-4 with previous L3-4 decompression and diskectomy.  POSTOPERATIVE DIAGNOSIS:  Recurrent lumbar spinal stenosis 4-5, 5-1 with recurrent lumbar disk herniation, L3-4 with previous L3-4 decompression and diskectomy.  OPERATIVE PROCEDURE: 1. Posterior revision decompression L3-4, L4-5 (Gill decompression). 2. L5-S1 decompression foraminotomy. 3. Posterior lateral in situ arthrodesis L3-L5.  FIRST ASSISTANT:  Norval Gable, PA  HISTORY:  This is a very pleasant gentleman who presented to my office with complaints of severe left leg pain and moderate-to-severe back pain.  Attempts at conservative management had failed to alleviate his symptoms.  As a result of the severe ongoing pain and dysfunction, we elected to proceed with surgery.  The patient had previous spinal surgery and so I did tell him that we would have to do a more extensive decompression because of his scar tissue.  Because he had an underlying spondylolisthesis at L4-5, I thought fusion may be required.  However, I did not feel as though instrumented fusion given his age was going to be required.  After discussing all appropriate risks and benefits to him, he consented to the aforementioned procedure.  OPERATIVE NOTE:  The patient was brought to the operating room, placed supine on the operating table.  After successful induction of general anesthesia and endotracheal intubation, TEDs, SCDs, and Foley were inserted.  The patient was turned prone onto the Wilson frame and all bony prominences were well padded.   The back was then prepped and draped in standard fashion.  Appropriate time-out was done confirming patient, procedure, and all other pertinent important data.  Once this was completed, a midline incision was made starting at the superior aspect of L3 and proceeding down to the superior aspect of S1.  Sharp dissection was carried out down to the deep fascia.  The deep fascia was sharply incised and I began the mobilize the paraspinal muscles on the right side to expose the remainder of the L3, the L4, and L5 spinous processes.  I then exposed the lamina and then exposed the L2-3, 3-4, 4- 5 facet complexes and in the L3-4 and 5 transverse processes on the right side.  Once this was complete, I then went to the contralateral side, and started my dissection at 5-1 level, proceeding to 4-5 and then to 3-4.  There was significant scar tissue noted at the 3-4 level. Great care was taken while dissecting this away to prevent iatrogenic neural injury.  Once I had the posterior aspect of the spine exposed, I then placed self-retaining retractors and I placed a marking device at the 3-4 disk space.  I took an x-ray and confirmed that this was the appropriate level.  Once this was done, I identified the L5 spinous process, and removed in its entirety.  I then removed the L4.  I then developed a plane underneath the L5 lamina, then using a 2 and 3 mm Kerrison performed a complete central decompression.  Once the complete laminectomy was performed, I did deal into the right lateral gutter and decompress the lateral gutter.  I did not do excessive bone removal on this side because I wanted to keep this side stable for bone grafting. Once I had the decompression complete on the lateral recess at L5-S1, I then proceeded to remove the ligamentum flavum between the L4 and L5 level and performed a complete L4 laminectomy.  Once this was done, I then performed a right-sided laminotomy of L3.  Once I had this  exposed, I was able to work my way towards the left side.  There was a significant amount of very adherent scar tissue, which I gently teased away from the thecal sac and eventually removed.  There was also even greater amount of scar tissue in the lateral recess.  Because of the extent to this and the fear of iatrogenic nerve root injury, I performed a complete Gill decompression at L3-4.  I removed the inferior L3 facet and then resected the L3 pars.  This allowed me to directly visualize the exiting L3 nerve root and ensure that it was no longer compressed. At this point, I could now see the disk space and mobilize the disk fragment.  This was a very large disk fragment that I removed.  However, there was still portions of it that were completely adherent to the thecal sac.  Despite multiple attempts using a Penfield 4, a nerve hook, and various other instrumentation, I could not remove the fragment. However, I completely decompressed the area and there was no further compression of the thecal sac or the exiting L3 nerve root.  Again utilizing my Penfield 4 to create a plane and then the 2 and 3 mm Kerrison, I continued my dissection inferiorly on the left-hand side and removed the inferior L4 facet complex.  The patient on preoperative MRI had significant foraminal stenosis and facet stenosis and so I removed this.  This allowed me to completely decompress the exiting L4 nerve root and the traversing L5 nerve root.  Once this was done, I then identified the L5 pedicle and performed a foraminotomy.  Once this was completed, I could now easily pass the nerve hook down to the S1 level, and up superior into the L3 pedicle.  All nerve roots were palpated and adequately decompressed.  I then irrigated the wound copiously with normal saline.  I then placed a deep drain, and then placed a thrombin and then obtained hemostasis using bipolar electrocautery.  Thrombin- soaked Gelfoam patty was  placed over the exposed thecal sac and then I placed vancomycin impregnated beads into the wound to decrease the likelihood of wound, the chance of wound infection.  I then closed the deep layer over the drain with an interrupted #1 Vicryl suture, superficial with 2-0 Vicryl suture, and 3-0 Monocryl for the skin.  The drain was also stitched in with a nylon suture.  A bulky dry dressing was applied.  The patient was extubated and transferred to the PACU without incident.  At the end of the case, all needle and sponge counts were correct.  There was no adverse intraoperative events.     Alvy Beal, MD     DDB/MEDQ  D:  08/16/2012  T:  08/17/2012  Job:  [id number]

## 2012-08-17 NOTE — Evaluation (Signed)
Physical Therapy Evaluation Patient Details Name: Keith Williamson MRN: [id number] DOB: 10-02-1946 Today's Date: 08/17/2012 Time: 1478-2956 PT Time Calculation (min): 31 min  PT Assessment / Plan / Recommendation Clinical Impression  Pt is 66 y/o male admitted for s/p L3-4/L4-5 lumbar decompression.  Pt will benefit from acute PT services to improve overall mobility and prepare for safe d/c home.    PT Assessment  Patient needs continued PT services    Follow Up Recommendations  No PT follow up;Supervision - Intermittent    Barriers to Discharge        Equipment Recommendations  Rolling walker with 5" wheels    Recommendations for Other Services     Frequency Min 5X/week    Precautions / Restrictions Precautions Precautions: Back Precaution Booklet Issued: Yes (comment) Precaution Comments: Educated 3/3 back precautions. Required Braces or Orthoses: Spinal Brace Spinal Brace: Lumbar corset;Applied in sitting position Restrictions Weight Bearing Restrictions: No   Pertinent Vitals/Pain 5/10 back pain      Mobility  Bed Mobility Bed Mobility: Rolling Right;Right Sidelying to Sit Rolling Right: 4: Min guard Right Sidelying to Sit: 4: Min assist;HOB flat;With rails Details for Bed Mobility Assistance: (A) to elevate HOB and cues for proper technique Transfers Transfers: Sit to Stand;Stand to Sit Sit to Stand: 4: Min guard;From bed Stand to Sit: 4: Min guard;To chair/3-in-1 Details for Transfer Assistance: Minguard for safety with cues for hand placement and proper technique Ambulation/Gait Ambulation/Gait Assistance: 4: Min guard Ambulation Distance (Feet): 150 Feet Assistive device: Rolling walker Ambulation/Gait Assistance Details: Minguard for safety with cues for proper step sequence.  max cues to relax shoulders Gait Pattern: Step-through pattern;Decreased stride length;Shuffle    Exercises     PT Diagnosis: Difficulty walking;Acute pain  PT Problem List:  Decreased strength;Decreased activity tolerance;Decreased mobility;Decreased knowledge of use of DME;Pain PT Treatment Interventions: DME instruction;Gait training;Stair training;Functional mobility training;Therapeutic activities;Patient/family education   PT Goals Acute Rehab PT Goals PT Goal Formulation: With patient/family Time For Goal Achievement: 08/24/12 Potential to Achieve Goals: Good Pt will Roll Supine to Right Side: with modified independence PT Goal: Rolling Supine to Right Side - Progress: Goal set today Pt will go Sit to Stand: with modified independence PT Goal: Sit to Stand - Progress: Goal set today Pt will go Stand to Sit: with modified independence PT Goal: Stand to Sit - Progress: Goal set today Pt will Ambulate: >150 feet;with modified independence;with rolling walker PT Goal: Ambulate - Progress: Goal set today Pt will Go Up / Down Stairs: 3-5 stairs;with modified independence;with least restrictive assistive device PT Goal: Up/Down Stairs - Progress: Goal set today  Visit Information  Last PT Received On: 08/17/12    Subjective Data  Subjective: "My knee already feels better after surgery." Patient Stated Goal: To go home   Prior Functioning  Home Living Lives With: Spouse Available Help at Discharge: Family Type of Home: House Home Access: Stairs to enter [employer] of Steps: 4 Entrance Stairs-Rails: Right;Left;Can reach both Home Layout: Two level;Full bath on main level Bathroom Shower/Tub: Tub/shower unit;Curtain Firefighter: Standard Bathroom Accessibility: Yes How Accessible: Accessible via walker Home Adaptive Equipment: Bedside commode/3-in-1;Walker - standard Prior Function Level of Independence: Independent with assistive device(s) (uses SPC occasionally) Able to Take Stairs?: Yes Driving: Yes Vocation: Retired Musician: No difficulties Dominant Hand: Right    Cognition  Overall Cognitive Status:  Appears within functional limits for tasks assessed/performed Arousal/Alertness: Awake/alert Orientation Level: Appears intact for tasks assessed Behavior During Session: [facility] for tasks  performed    Extremity/Trunk Assessment Right Lower Extremity Assessment RLE ROM/Strength/Tone: Within functional levels RLE Sensation: WFL - Light Touch Left Lower Extremity Assessment LLE ROM/Strength/Tone: Within functional levels LLE Sensation: WFL - Light Touch   Balance    End of Session PT - End of Session Equipment Utilized During Treatment: Gait belt;Back brace Activity Tolerance: Patient tolerated treatment well Patient left: in chair;with call bell/phone within reach;with family/visitor present Nurse Communication: Mobility status  GP     Elroy Schembri 08/17/2012, 10:37 AM Jake Shark, PT DPT [phone]

## 2012-08-17 NOTE — Progress Notes (Signed)
    Subjective: Procedure(s) (LRB): DECOMPRESSIVE LUMBAR LAMINECTOMY LEVEL 3 (N/A) 1 Day Post-Op  Patient reports pain as 4 on 0-10 scale.  Reports decreased leg pain reports incisional back pain   Positive void Negative bowel movement Negative flatus Negative chest pain or shortness of breath  Objective: Vital signs in last 24 hours: Temp:  [97.3 F (36.3 C)-98.7 F (37.1 C)] 98.7 F (37.1 C) (09/05 0710) Pulse Rate:  [61-103] 88  (09/05 0710) Resp:  [5-18] 14  (09/05 0710) BP: (138-186)/(67-91) 140/82 mmHg (09/05 0940) SpO2:  [97 %-100 %] 99 % (09/05 0710)  Intake/Output from previous day: 09/04 0701 - 09/05 0700 In: 6036.6 [I.V.:4436.6; Blood:100; IV Piggyback:500] Out: 4290 [Urine:3550; Drains:340; Blood:400]  Labs: No results found for this basename: WBC:2,RBC:2,HCT:2,PLT:2 in the last 72 hours  Basename 08/16/12 0741  NA 141  K 3.7  CL 106  CO2 25  BUN 22  CREATININE 1.45*  GLUCOSE 101*  CALCIUM 9.5    Basename 08/16/12 0741  LABPT --  INR 1.07    Physical Exam: Neurologically intact ABD soft Neurovascular intact Intact pulses distally Incision: dressing C/D/I and no drainage Compartment soft  Assessment/Plan: Patient stable  xrays n/a Continue mobilization with physical therapy Continue care  Advance diet Up with therapy Plan for discharge tomorrow or Saturday  Venita Lick, MD [facility] [phone]

## 2012-08-17 NOTE — Progress Notes (Signed)
Occupational Therapy Evaluation Patient Details Name: Keith Williamson MRN: [id number] DOB: 11-Jan-1946 Today's Date: 08/17/2012 Time: 7829-5621 OT Time Calculation (min): 27 min  OT Assessment / Plan / Recommendation Clinical Impression  66 yo s/p lumbar fusion. Back precautions. Pt will benefit from skilled OT services to max independence with ADL and functional mobility for ADL with nec AE and DME due to below deficits.     OT Assessment  Patient needs continued OT Services    Follow Up Recommendations  No OT follow up    Barriers to Discharge None    Equipment Recommendations  Rolling walker with 5" wheels    Recommendations for Other Services    Frequency  Min 2X/week    Precautions / Restrictions Precautions Precautions: Back Precaution Booklet Issued: Yes (comment) Precaution Comments: only able to recall 1/3 precautions. Required Braces or Orthoses: Spinal Brace Spinal Brace: Lumbar corset;Applied in sitting position   Pertinent Vitals/Pain 5. nsg gave pain meds    ADL  Eating/Feeding: Independent Grooming: Simulated;Supervision/safety Where Assessed - Grooming: Unsupported sitting Upper Body Bathing: Simulated;Set up Where Assessed - Upper Body Bathing: Unsupported sitting Lower Body Bathing: Simulated;Moderate assistance Where Assessed - Lower Body Bathing: Unsupported sit to stand Upper Body Dressing: Simulated;Supervision/safety Where Assessed - Upper Body Dressing: Unsupported sitting Lower Body Dressing: Simulated;Moderate assistance Where Assessed - Lower Body Dressing: Unsupported sit to stand Toilet Transfer: Simulated;Supervision/safety Toilet Transfer Method: Sit to Barista: Materials engineer and Hygiene: Simulated;Supervision/safety Where Assessed - Engineer, mining and Hygiene: Standing Tub/Shower Transfer: Landscape architect Method:  Science writer: Other (comment) (3 in 1) Equipment Used: Back brace;Gait belt;Reacher;Rolling walker Transfers/Ambulation Related to ADLs: S RW level ADL Comments: Educated pt/wife on ADL with back precautions and available AE and use of DME.    OT Diagnosis: Generalized weakness;Acute pain  OT Problem List: Decreased strength;Decreased range of motion;Decreased activity tolerance;Decreased safety awareness;Decreased knowledge of use of DME or AE;Decreased knowledge of precautions;Pain OT Treatment Interventions: Self-care/ADL training;DME and/or AE instruction;Patient/family education;Therapeutic activities   OT Goals Acute Rehab OT Goals OT Goal Formulation: With patient Time For Goal Achievement: 08/24/12 Potential to Achieve Goals: Good ADL Goals Pt Will Transfer to Toilet: with supervision;with caregiver independent in assisting;3-in-1;Maintaining back safety precautions;Ambulation ADL Goal: Toilet Transfer - Progress: Goal set today Pt Will Perform Toileting - Clothing Manipulation: with supervision;with caregiver independent in assisting;Standing ADL Goal: Toileting - Clothing Manipulation - Progress: Goal set today Pt Will Perform Toileting - Hygiene: with supervision;with caregiver independent in assisting;Standing at 3-in-1/toilet;with cueing (comment type and amount);with adaptive equipment (AE PRN) ADL Goal: Toileting - Hygiene - Progress: Goal set today Pt Will Perform Tub/Shower Transfer: Tub transfer;with supervision;with caregiver independent in assisting;Ambulation;Maintaining back safety precautions;Other (comment) (3 in 1. Step over) ADL Goal: Tub/Shower Transfer - Progress: Goal set today Additional ADL Goal #1: Pt sill state 3/3 back precautions independently. ADL Goal: Additional Goal #1 - Progress: Goal set today  Visit Information  Last OT Received On: 08/17/12    Subjective Data      Prior Functioning  Vision/Perception  Home  Living Lives With: Spouse Available Help at Discharge: Family Type of Home: House Home Access: Stairs to enter [employer] of Steps: 4 Entrance Stairs-Rails: Right;Left;Can reach both Home Layout: Two level;Full bath on main level Bathroom Shower/Tub: Tub/shower unit;Curtain Bathroom Toilet: Standard Bathroom Accessibility: Yes How Accessible: Accessible via walker Home Adaptive Equipment: Bedside commode/3-in-1;Walker - standard Prior Function Level of Independence: Independent with assistive device(s) (  uses SPC occasionally) Able to Take Stairs?: Yes Driving: Yes Vocation: Retired Musician: No difficulties Dominant Hand: Right      Cognition  Overall Cognitive Status: Appears within functional limits for tasks assessed/performed Arousal/Alertness: Awake/alert Orientation Level: Appears intact for tasks assessed Behavior During Session: [facility] for tasks performed    Extremity/Trunk Assessment Right Upper Extremity Assessment RUE ROM/Strength/Tone: Within functional levels Left Upper Extremity Assessment LUE ROM/Strength/Tone: Within functional levels   Mobility    Bed Mobility Bed Mobility: Supine to Sit Rolling Right: 5: Supervision Right Sidelying to Sit: 5: Supervision;With rails;HOB flat Supine to Sit: 5: Supervision;HOB flat;With rails Details for Bed Mobility Assistance: vc for log rolling Transfers Transfers: Sit to Stand;Stand to Sit Sit to Stand: 5: Supervision;From chair/3-in-1;With upper extremity assist Stand to Sit: 5: Supervision;With upper extremity assist;To chair/3-in-1 Details for Transfer Assistance: vc for hand placement       Exercise     Balance  WFL   End of Session OT - End of Session Equipment Utilized During Treatment: Gait belt;Back brace Activity Tolerance: Patient tolerated treatment well Patient left: in chair;with call bell/phone within reach;with family/visitor present Nurse Communication: Mobility  status;Precautions  GO     Arabel Barcenas,HILLARY 08/17/2012, 5:03 PM [facility], OTR/L  [phone] 08/17/2012

## 2012-08-18 MED ORDER — ONDANSETRON HCL 4 MG PO TABS: 4.0000 mg | ORAL_TABLET | Freq: Three times a day (TID) | ORAL | Status: AC | PRN

## 2012-08-18 MED ORDER — POLYETHYLENE GLYCOL 3350 17 G PO PACK: 17.0000 g | PACK | Freq: Every day | ORAL | Status: AC

## 2012-08-18 MED ORDER — OXYCODONE-ACETAMINOPHEN 10-325 MG PO TABS: 1.0000 | ORAL_TABLET | Freq: Four times a day (QID) | ORAL | Status: AC | PRN

## 2012-08-18 MED ORDER — FLEET ENEMA 7-19 GM/118ML RE ENEM
1.0000 | ENEMA | Freq: Once | RECTAL | Status: AC
  Administered 2012-08-18: 1 via RECTAL
  Filled 2012-08-18: qty 1

## 2012-08-18 MED ORDER — METHOCARBAMOL 500 MG PO TABS: 500.0000 mg | ORAL_TABLET | Freq: Three times a day (TID) | ORAL | Status: AC

## 2012-08-18 MED ORDER — CEFAZOLIN SODIUM 1-5 GM-% IV SOLN
1.0000 g | Freq: Three times a day (TID) | INTRAVENOUS | Status: DC
  Administered 2012-08-18 – 2012-08-19 (×3): 1 g via INTRAVENOUS
  Filled 2012-08-18 (×6): qty 50

## 2012-08-18 MED FILL — Sodium Chloride Irrigation Soln 0.9%: Qty: 3000 | Status: AC

## 2012-08-18 MED FILL — Sodium Chloride IV Soln 0.9%: INTRAVENOUS | Qty: 1000 | Status: AC

## 2012-08-18 MED FILL — Heparin Sodium (Porcine) Inj 1000 Unit/ML: INTRAMUSCULAR | Qty: 30 | Status: AC

## 2012-08-18 NOTE — Progress Notes (Signed)
CARE MANAGEMENT NOTE 08/18/2012  Patient:  Keith Williamson, Keith Williamson   Account Number:  [id number]  Date Initiated:  08/18/2012  Documentation initiated by:  Vance Peper  Subjective/Objective Assessment:   66 yr old male s/p L3-L4 revision decompression.     Action/Plan:   DME ordered   Anticipated DC Date:  08/18/2012   Anticipated DC Plan:  HOME/SELF CARE      DC Planning Services  CM consult      PAC Choice  DURABLE MEDICAL EQUIPMENT   Choice offered to / List presented to:     DME arranged  3-N-1  Levan Hurst      DME agency  [facility]        Status of service:  Completed, signed off Medicare Important Message given?   (If response is "NO", the following Medicare IM given date fields will be blank) Date Medicare IM given:   Date Additional Medicare IM given:    Discharge Disposition:  HOME/SELF CARE  Per UR Regulation:    If discussed at Long Length of Stay Meetings, dates discussed:    Comments:

## 2012-08-18 NOTE — Progress Notes (Signed)
Referral received for SNF. Chart reviewed and CSW has spoken with RNCM who indicates that patient is for DC to home with DME. HH services are not indicated per discussion with RNCM.  CSW to sign off. Please re-consult if CSW needs arise.  Lorri Frederick. [place]  [phone]

## 2012-08-18 NOTE — Discharge Summary (Signed)
Patient ID: Keith Williamson MRN: [id number] DOB/AGE: 67-31-1947 67 y.o.  Admit date: 08/16/2012 Discharge date: 08/18/2012  Admission Diagnoses:  Principal Problem:  *Spinal stenosis, lumbar   Discharge Diagnoses:  Principal Problem:  *Spinal stenosis, lumbar  status post Procedure(s): DECOMPRESSIVE LUMBAR LAMINECTOMY LEVEL 3  Past Medical History  Diagnosis Date  . Hypertension   . Hypothyroidism   . Headache     frequent sinus headache  . Cancer     hx thyroid cancer with iodine tx    Surgeries: Procedure(s): DECOMPRESSIVE LUMBAR LAMINECTOMY LEVEL 3 on 08/16/2012   Consultants:    Discharged Condition: Improved  Hospital Course: Keith Williamson is an 66 y.o. male who was admitted 08/16/2012 for operative treatment of Spinal stenosis, lumbar. Patient failed conservative treatments (please see the history and physical for the specifics) and had severe unremitting pain that affects sleep, daily activities and work/hobbies. After pre-op clearance, the patient was taken to the operating room on 08/16/2012 and underwent  Procedure(s): DECOMPRESSIVE LUMBAR LAMINECTOMY LEVEL 3.    Patient was given perioperative antibiotics: Anti-infectives     Start     Dose/Rate Route Frequency Ordered Stop   08/16/12 2100   ceFAZolin (ANCEF) IVPB 1 g/50 mL premix        1 g 100 mL/hr over 30 Minutes Intravenous Every 8 hours 08/16/12 1700 08/17/12 0439   08/16/12 1334   vancomycin (VANCOCIN) powder  Status:  Discontinued          As needed 08/16/12 1334 08/16/12 1415   08/15/12 1409   ceFAZolin (ANCEF) IVPB 2 g/50 mL premix        2 g 100 mL/hr over 30 Minutes Intravenous 60 min pre-op 08/15/12 1409 08/16/12 1330           Patient was given sequential compression devices and early ambulation to prevent DVT.   Patient benefited maximally from hospital stay and there were no complications. At the time of discharge, the patient was urinating/moving their bowels without difficulty,  tolerating a regular diet, pain is controlled with oral pain medications and they have been cleared by PT/OT.   Recent vital signs: Patient Vitals for the past 24 hrs:  BP Temp Temp src Pulse Resp SpO2  08/18/12 0630 160/90 mmHg 98 F (36.7 C) Oral 78  18  100 %  08/17/12 2215 150/72 mmHg 98.2 F (36.8 C) Oral 78  20  100 %  08/17/12 1429 168/89 mmHg 98.8 F (37.1 C) Oral 73  18  98 %  08/17/12 0940 140/82 mmHg - - - - -     Recent laboratory studies:  Basename 08/16/12 0741  WBC --  HGB --  HCT --  PLT --  NA 141  K 3.7  CL 106  CO2 25  BUN 22  CREATININE 1.45*  GLUCOSE 101*  INR 1.07  CALCIUM 9.5     Discharge Medications:   Medication List  As of 08/18/2012  7:57 AM   STOP taking these medications         oxyCODONE-acetaminophen 5-325 MG per tablet         TAKE these medications         doxazosin 4 MG tablet   Commonly known as: CARDURA   Take 4 mg by mouth 2 (two) times daily.      EDARBI 40 MG Tabs   Generic drug: Azilsartan Medoxomil   Take 40 mg by mouth daily.      finasteride 5 MG  tablet   Commonly known as: PROSCAR   Take 5 mg by mouth daily.      levothyroxine 137 MCG tablet   Commonly known as: SYNTHROID, LEVOTHROID   Take 137 mcg by mouth daily.      methocarbamol 500 MG tablet   Commonly known as: ROBAXIN   Take 1 tablet (500 mg total) by mouth 3 (three) times daily. MAX 3 pills daily      ondansetron 4 MG tablet   Commonly known as: ZOFRAN   Take 1 tablet (4 mg total) by mouth every 8 (eight) hours as needed for nausea. MAX 3 pills daily      oxyCODONE-acetaminophen 10-325 MG per tablet   Commonly known as: PERCOCET   Take 1 tablet by mouth every 6 (six) hours as needed for pain. MAX 4 pills daily      polyethylene glycol packet   Commonly known as: MIRALAX / GLYCOLAX   Take 17 g by mouth daily. Take 1 packet daily until bowels become regular            Diagnostic Studies: Dg Chest 2 View  08/07/2012  *RADIOLOGY REPORT*   Clinical Data: 66 year old male preoperative study for lumbar surgery.  Hypertension.  CHEST - 2 VIEW  Comparison: None.  Findings: Lung volumes within normal limits.  Cardiac size and mediastinal contours are within normal limits.  Visualized tracheal air column is within normal limits.  No pneumothorax, pulmonary edema, pleural effusion or confluent pulmonary opacity.  Large endplate osteophytes in the mid thoracic spine. No acute osseous abnormality identified.  IMPRESSION: No acute cardiopulmonary abnormality.   Original Report Authenticated By: Harley Hallmark, M.D.    Dg Lumbar Spine 2-3 Views  08/16/2012  *RADIOLOGY REPORT*  Clinical Data: 66 year old male undergoing lumbar surgery.  LUMBAR SPINE - 2-3 VIEW  Comparison: Preoperative radiographs 08/07/2012.  Findings: Initial film at 0915 hours.  Cephalad needle at the L3 spinous process level.  Caudal needle projects over the L5 inferior spinous process, directed toward S1.  Film #2 at 0932 hours.  Surgical clamp on the L4 spinous process.  IMPRESSION: Intraoperative localization as above.   Original Report Authenticated By: Harley Hallmark, M.D.    X-ray Lumbar Spine Ap And Lateral  08/07/2012  *RADIOLOGY REPORT*  Clinical Data: 66 year old male preoperative study for lumbar surgery.  LUMBAR SPINE - 2-3 VIEW  Comparison: Lumbar MRI 03/15/2006.  Findings: Normal lumbar segmentation as depicted on comparison. Trace anterolisthesis at L4-L5 appears increased.  Severe lower lumbar facet hypertrophy.  Sacral ala and SI joints within normal limits.  IMPRESSION: Normal lumbar segmentation. The lumbar levels on these images were numbered in anticipation of surgery. Lower lumbar grade 1 spondylolisthesis and severe facet degeneration.   Original Report Authenticated By: Harley Hallmark, M.D.    Dg Lumbar Spine 1 View  08/16/2012  *RADIOLOGY REPORT*  Clinical Data: 65 year old male undergoing lumbar surgery.  LUMBAR SPINE - 1 VIEW  Comparison: Preoperative  radiographs 08/07/2012 and earlier.  Findings: Film at 1137 hours is a cross-table lateral intraoperative view of the lumbar spine.  Retractors at the L4 level. Cephalad surgical probe at the L3 neural foramen level. Caudal surgical probe at the S1 vertebral body level.  IMPRESSION: Intraoperative localization as above. Results called to Dr. Shon Baton in the operating room at 1207 hours on 08/16/2012.   Original Report Authenticated By: Harley Hallmark, M.D.     Discharge Orders    Future Orders Please Complete By Expires  Diet - low sodium heart healthy      Face-to-face encounter (required for Medicare/Medicaid patients)      Comments:   I Gwinda Maine certify that this patient is under my care and that I, or a nurse practitioner or physician's assistant working with me, had a face-to-face encounter that meets the physician face-to-face encounter requirements with this patient on 08/18/2012.   Questions: Responses:   The encounter with the patient was in whole, or in part, for the following medical condition, which is the primary reason for home health care low back and leg pain s/p decompression L3-5   I certify that, based on my findings, the following services are medically necessary home health services Physical therapy   My clinical findings support the need for the above services OTHER SEE COMMENTS   Further, I certify that my clinical findings support that this patient is homebound due to: Ambulates short distances less than 300 feet   To provide the following care/treatments PT    OT   Call MD / Call 911      Comments:   If you experience chest pain or shortness of breath, CALL 911 and be transported to the hospital emergency room.  If you develope a fever above 101 F, pus (white drainage) or increased drainage or redness at the wound, or calf pain, call your surgeon's office.   Constipation Prevention      Comments:   Drink plenty of fluids.  Prune juice may be helpful.  You may use a  stool softener, such as Colace (over the counter) 100 mg twice a day.  Use MiraLax (over the counter) for constipation as needed.   Increase activity slowly as tolerated      Discharge instructions      Comments:   Keep incision clean and dry.  Leave steri strips in place.  May shower 5 days from surgery; pat to dry following shower.  May redress with clean, dry dressing if you would like.  Do not apply any lotion/cream/ointment to the incision.   Driving restrictions      Comments:   No driving for 2 weeks.  Dr Shon Baton will discuss addition driving restrictions at your first post-op visit in 2 weeks.   Lifting restrictions      Comments:   No lifting anything greater than 5 pounds.  DO NOT reach overhead (above shoulder height).  NO bending, stooping or squatting.  Dr. Shon Baton will discuss additional lifting restrictions at your first post-op visit in 2 weeks.      Follow-up Information    Follow up with Alvy Beal, MD in 2 weeks.   Contact information:   [facility] [address] [place] [address] [phone]          Discharge Plan:  discharge to Home   Disposition: stable at the time of discharge     Signed: Gwinda Maine for Dr. Venita Lick [facility] [phone] 08/18/2012, 7:57 AM

## 2012-08-18 NOTE — Progress Notes (Signed)
    Subjective: Procedure(s) (LRB): DECOMPRESSIVE LUMBAR LAMINECTOMY LEVEL 3 (N/A) 2 Days Post-Op  Patient reports pain as 2 on 0-10 scale.  Reports decreased leg pain denies incisional back pain   Positive void Negative bowel movement Positive flatus Negative chest pain or shortness of breath  Objective: Vital signs in last 24 hours: Temp:  [98 F (36.7 C)-98.8 F (37.1 C)] 98 F (36.7 C) (09/06 0630) Pulse Rate:  [73-78] 78  (09/06 0630) Resp:  [18-20] 18  (09/06 0630) BP: (140-168)/(72-90) 160/90 mmHg (09/06 0630) SpO2:  [98 %-100 %] 100 % (09/06 0630)  Intake/Output from previous day: 09/05 0701 - 09/06 0700 In: 900 [P.O.:900] Out: 300 [Urine:300]  Labs: No results found for this basename: WBC:2,RBC:2,HCT:2,PLT:2 in the last 72 hours  Basename 08/16/12 0741  NA 141  K 3.7  CL 106  CO2 25  BUN 22  CREATININE 1.45*  GLUCOSE 101*  CALCIUM 9.5    Basename 08/16/12 0741  LABPT --  INR 1.07    Physical Exam: Neurologically intact ABD soft Neurovascular intact Intact pulses distally Incision: dressing C/D/I and no drainage No cellulitis present Compartment soft Drain: no results posted for 9/5-9/6  Current output - 55cc  Assessment/Plan: Patient stable  xrays n/a Continue mobilization with physical therapy Continue care  Up with therapy Plan for discharge tomorrow Hold on d/c to home today because of drain output Restart iv abx since drain will remain   Venita Lick, MD [facility] [phone]

## 2012-08-18 NOTE — Progress Notes (Signed)
Physical Therapy Treatment Patient Details Name: Keith Williamson MRN: [id number] DOB: 22-Dec-1945 Today's Date: 08/18/2012 Time: 4098-1191 PT Time Calculation (min): 23 min  PT Assessment / Plan / Recommendation Comments on Treatment Session  Pt was motivated to perform therapy and pt able to perform stair training.  Ambulation step length and velocity has increased since last assessment.    Follow Up Recommendations  No PT follow up;Supervision - Intermittent    Barriers to Discharge        Equipment Recommendations  Rolling walker with 5" wheels    Recommendations for Other Services    Frequency Min 5X/week   Plan Discharge plan remains appropriate;Frequency remains appropriate    Precautions / Restrictions Precautions Precautions: Back Precaution Booklet Issued: Yes (comment) Precaution Comments: Educated 3/3 back precautions. Required Braces or Orthoses: Spinal Brace Spinal Brace: Lumbar corset;Applied in sitting position Restrictions Weight Bearing Restrictions: No   Pertinent Vitals/Pain 2/10 LBP    Mobility  Bed Mobility Rolling Right:  (Simultaneous filing. User may not have seen previous data.) Right Sidelying to Sit: 6: Modified independent (Device/Increase time);HOB flat Supine to Sit: 6: Modified independent (Device/Increase time);HOB flat Sit to Supine: 6: Modified independent (Device/Increase time);HOB flat Details for Bed Mobility Assistance: wife completed bed mobility with pt. Transfers Sit to Stand: 6: Modified independent (Device/Increase time);From elevated surface;With upper extremity assist;From bed;From chair/3-in-1;From toilet Stand to Sit: 6: Modified independent (Device/Increase time);Without upper extremity assist;To elevated surface;To bed;To chair/3-in-1;To toilet Details for Transfer Assistance: good demonstration of backk precautions Ambulation/Gait Ambulation/Gait Assistance: 5: Supervision Ambulation Distance (Feet): 300 Feet Assistive  device: Rolling walker Ambulation/Gait Assistance Details: Cues for proper technique and ambulation frequency throughout the day Gait Pattern: Step-through pattern;Decreased stride length Gait velocity: decreased Stairs: Yes Stairs Assistance: 5: Supervision Stairs Assistance Details (indicate cue type and reason): Cues for hand placement, proper technique, and safety Stair Management Technique: Two rails;Step to pattern;Forwards Number of Stairs: 2  ((2 trials))    Exercises     PT Diagnosis:    PT Problem List:   PT Treatment Interventions:     PT Goals Acute Rehab PT Goals PT Goal Formulation: With patient/family Time For Goal Achievement: 08/24/12 Potential to Achieve Goals: Good Pt will go Sit to Stand: with modified independence PT Goal: Sit to Stand - Progress: MET Pt will go Stand to Sit: with modified independence PT Goal: Stand to Sit - Progress: Progressing toward goal Pt will Ambulate: >150 feet;with modified independence;with rolling walker PT Goal: Ambulate - Progress: Progressing toward goal Pt will Go Up / Down Stairs: 3-5 stairs;with modified independence;with least restrictive assistive device PT Goal: Up/Down Stairs - Progress: Progressing toward goal  Visit Information  Last PT Received On: 08/18/12 Assistance Needed: +1    Subjective Data  Subjective: Feeling better and legs still feel stronger Patient Stated Goal: To go home   Cognition  Overall Cognitive Status: Impaired Area of Impairment: Memory Arousal/Alertness: Awake/alert Orientation Level: Appears intact for tasks assessed Behavior During Session: [facility] for tasks performed Memory: Decreased recall of precautions Memory Deficits: wife cues husband    Balance     End of Session     GP     DITOMMASO, AMY 08/18/2012, 1:03 PM Amy DiTommaso, SPT [place], PT DPT [phone]

## 2012-08-18 NOTE — Progress Notes (Signed)
Occupational Therapy Treatment Patient Details Name: Keith Williamson MRN: [id number] DOB: December 07, 1946 Today's Date: 08/18/2012 Time: 4098-1191 OT Time Calculation (min): 19 min  OT Assessment / Plan / Recommendation Comments on Treatment Session Excellent progress. All OT goals met. Ready for D/C from ADL and mobility standpoint.    Follow Up Recommendations  No OT follow up    Barriers to Discharge   none    Equipment Recommendations  None recommended by OT    Recommendations for Other Services  none  Frequency Min 2X/week   Plan Discharge plan remains appropriate    Precautions / Restrictions Precautions Precautions: Back Precaution Booklet Issued: Yes (comment) Precaution Comments: Educated 3/3 back precautions. Required Braces or Orthoses: Spinal Brace Spinal Brace: Lumbar corset;Applied in sitting position Restrictions Weight Bearing Restrictions: No   Pertinent Vitals/Pain 2    ADL  Tub/Shower Transfer: Engineer, manufacturing Method: Science writer: Other (comment) (step over and using 3 in 1) Equipment Used: Gait belt;Back brace Transfers/Ambulation Related to ADLs: S ADL Comments: Following back precautions during ADL.    OT Diagnosis:    OT Problem List:   OT Treatment Interventions:     OT Goals Acute Rehab OT Goals OT Goal Formulation: With patient Time For Goal Achievement: 08/24/12 Potential to Achieve Goals: Good ADL Goals Pt Will Transfer to Toilet: with supervision;with caregiver independent in assisting;3-in-1;Maintaining back safety precautions;Ambulation ADL Goal: Toilet Transfer - Progress: Met Pt Will Perform Toileting - Clothing Manipulation: with supervision;with caregiver independent in assisting;Standing ADL Goal: Toileting - Clothing Manipulation - Progress: Met Pt Will Perform Toileting - Hygiene: with supervision;with caregiver independent in assisting;Standing at 3-in-1/toilet;with  cueing (comment type and amount);with adaptive equipment ADL Goal: Toileting - Hygiene - Progress: Met Pt Will Perform Tub/Shower Transfer: Tub transfer;with supervision;with caregiver independent in assisting;Ambulation;Maintaining back safety precautions;Other (comment) ADL Goal: Tub/Shower Transfer - Progress: Met Additional ADL Goal #1: Pt sill state 3/3 back precautions independently. ADL Goal: Additional Goal #1 - Progress: Met  Visit Information  Last OT Received On: 08/18/12 Assistance Needed: +1    Subjective Data      Prior Functioning       Cognition  Overall Cognitive Status: Impaired Area of Impairment: Memory Arousal/Alertness: Awake/alert Orientation Level: Appears intact for tasks assessed Behavior During Session: [facility] for tasks performed Memory: Decreased recall of precautions Memory Deficits: wife cues husband    Mobility  Shoulder Instructions Bed Mobility Rolling Right:  (Simultaneous filing. User may not have seen previous data.) Right Sidelying to Sit: 6: Modified independent (Device/Increase time);HOB flat Supine to Sit: 6: Modified independent (Device/Increase time);HOB flat Sit to Supine: 6: Modified independent (Device/Increase time);HOB flat Details for Bed Mobility Assistance: wife completed bed mobility with pt. Transfers Transfers: Sit to Stand;Stand to Sit Sit to Stand: 6: Modified independent (Device/Increase time);From elevated surface;With upper extremity assist;From bed;From chair/3-in-1;From toilet Stand to Sit: 6: Modified independent (Device/Increase time);Without upper extremity assist;To elevated surface;To bed;To chair/3-in-1;To toilet Details for Transfer Assistance: good demonstration of backk precautions       Exercises      Balance  WFL   End of Session OT - End of Session Equipment Utilized During Treatment: Gait belt;Back brace Activity Tolerance: Patient tolerated treatment well Patient left: in chair;with call  bell/phone within reach;with family/visitor present Nurse Communication: Mobility status;Precautions  GO     Keith Williamson,HILLARY 08/18/2012, 12:11 PM [facility], OTR/L  [phone] 08/18/2012

## 2012-08-19 NOTE — Progress Notes (Signed)
Reviewed d/c instructions with patient and wife.  Follow up appt scheduled.  Dressing supplies and instructions given per MD and patient request.  Rx x 4 given.  Pt and wife verbalized understanding.  All questions answered.  Pt has all belongings. Pt d/c via wheelchair in stable condition.

## 2012-08-19 NOTE — Progress Notes (Signed)
Physical Therapy Treatment Patient Details Name: Keith Williamson MRN: [id number] DOB: 09/11/1946 Today's Date: 08/19/2012 Time: 4098-1191 PT Time Calculation (min): 28 min  PT Assessment / Plan / Recommendation Comments on Treatment Session  Great progress today. Pt has all needed DME and is ready for discharge.    Follow Up Recommendations  No PT follow up;Supervision - Intermittent       Equipment Recommendations  Rolling walker with 5" wheels       Frequency Min 5X/week   Plan Discharge plan remains appropriate;Frequency remains appropriate    Precautions / Restrictions Precautions Precautions: Back. Pt independent with recall and demo of 3/3 back precautions during session. Required Braces or Orthoses: Spinal Brace Spinal Brace: Lumbar corset;Applied in sitting position (pt independent with don/doff of brace) Restrictions Weight Bearing Restrictions: No    Pertinent Vitals/Pain Reports pain as minimal, 4-5/10 before and after in low back.    Mobility  Bed Mobility Rolling Right: 6: Modified independent (Device/Increase time) Right Sidelying to Sit: 6: Modified independent (Device/Increase time) Sit to Supine: 6: Modified independent (Device/Increase time) Details for Bed Mobility Assistance: no rails or assistance required. no cues required. Transfers Sit to Stand: 6: Modified independent (Device/Increase time) Stand to Sit: 6: Modified independent (Device/Increase time) Details for Transfer Assistance: no cues or assistance required Ambulation/Gait Ambulation/Gait Assistance: 6: Modified independent (Device/Increase time) Ambulation Distance (Feet): 350 Feet Assistive device: Rolling walker Gait Pattern: Within Functional Limits;Step-through pattern Stairs Assistance: 6: Modified independent (Device/Increase time) Stairs Assistance Details (indicate cue type and reason): pt independent with recall of and demo of safe stair technique Stair Management Technique: Two  rails;Step to pattern;Forwards Number of Stairs: 5  (5 stairs x2 trials)     PT Goals Acute Rehab PT Goals PT Goal: Rolling Supine to Right Side - Progress: Met PT Goal: Sit to Stand - Progress: Met PT Goal: Stand to Sit - Progress: Met PT Goal: Ambulate - Progress: Met PT Goal: Up/Down Stairs - Progress: Met  Visit Information  Last PT Received On: 08/19/12 Assistance Needed: +1    Subjective Data  Subjective: No new complaints. Agreeable to therapy.   Cognition  Overall Cognitive Status: Appears within functional limits for tasks assessed/performed Arousal/Alertness: Awake/alert Orientation Level: Appears intact for tasks assessed Behavior During Session: [facility] for tasks performed       End of Session PT - End of Session Equipment Utilized During Treatment: Gait belt;Back brace Activity Tolerance: Patient tolerated treatment well Patient left: in bed;with family/visitor present;with call bell/phone within reach Nurse Communication: Mobility status   GP     Sallyanne Kuster 08/19/2012, 10:44 AM  Sallyanne Kuster, PTA Office- [phone]

## 2012-08-19 NOTE — Progress Notes (Signed)
Orthopedics Progress Note  Subjective: I feel better. The knee pain is gone. Objective:  Filed Vitals:   08/19/12 0617  BP: 126/88  Pulse: 65  Temp: 97.5 F (36.4 C)  Resp: 16    General: Awake and alert  Musculoskeletal: lumbar incisions CDI, mepilex dressing in place. Drain removed. LE motor strength 5//5, sensation intact Neurovascularly intact  Lab Results  Component Value Date   WBC 5.8 08/07/2012   HGB 13.8 08/07/2012   HCT 40.8 08/07/2012   MCV 85.0 08/07/2012   PLT 181 08/07/2012       Component Value Date/Time   NA 141 08/16/2012 0741   K 3.7 08/16/2012 0741   CL 106 08/16/2012 0741   CO2 25 08/16/2012 0741   GLUCOSE 101* 08/16/2012 0741   BUN 22 08/16/2012 0741   CREATININE 1.45* 08/16/2012 0741   CALCIUM 9.5 08/16/2012 0741   GFRNONAA 48* 08/16/2012 0741   GFRAA 56* 08/16/2012 0741    Lab Results  Component Value Date   INR 1.07 08/16/2012    Assessment/Plan: POD #3 s/p Procedure(s): DECOMPRESSIVE LUMBAR LAMINECTOMY LEVEL 3 Patient ready for D/C this AM Drain out  [employer]. Ranell Patrick, MD 08/19/2012 8:15 AM

## 2012-12-13 DIAGNOSIS — I219 Acute myocardial infarction, unspecified: Secondary | ICD-10-CM

## 2012-12-13 HISTORY — DX: Acute myocardial infarction, unspecified: I21.9

## 2013-06-24 LAB — CBC
HCT: 39.5 % — ABNORMAL LOW (ref 40.0–52.0)
HGB: 13.6 g/dL (ref 13.0–18.0)
MCHC: 34.4 g/dL (ref 32.0–36.0)
MCV: 84 fL (ref 80–100)
Platelet: 178 10*3/uL (ref 150–440)
WBC: 7.1 10*3/uL (ref 3.8–10.6)

## 2013-06-24 LAB — BASIC METABOLIC PANEL
Anion Gap: 4 — ABNORMAL LOW (ref 7–16)
BUN: 15 mg/dL (ref 7–18)
Chloride: 105 mmol/L (ref 98–107)
EGFR (Non-African Amer.): 52 — ABNORMAL LOW
Glucose: 105 mg/dL — ABNORMAL HIGH (ref 65–99)
Osmolality: 279 (ref 275–301)

## 2013-06-24 LAB — CK TOTAL AND CKMB (NOT AT ARMC): CK-MB: 4.9 ng/mL — ABNORMAL HIGH (ref 0.5–3.6)

## 2013-06-25 LAB — CBC WITH DIFFERENTIAL/PLATELET
Basophil #: 0 10*3/uL (ref 0.0–0.1)
Eosinophil #: 0.1 10*3/uL (ref 0.0–0.7)
Eosinophil %: 0.9 %
HGB: 13 g/dL (ref 13.0–18.0)
Lymphocyte %: 15.9 %
MCV: 84 fL (ref 80–100)
Platelet: 179 10*3/uL (ref 150–440)
RDW: 14.7 % — ABNORMAL HIGH (ref 11.5–14.5)
WBC: 7.6 10*3/uL (ref 3.8–10.6)

## 2013-06-25 LAB — BASIC METABOLIC PANEL
BUN: 14 mg/dL (ref 7–18)
Calcium, Total: 8.6 mg/dL (ref 8.5–10.1)
EGFR (African American): 58 — ABNORMAL LOW
EGFR (Non-African Amer.): 50 — ABNORMAL LOW
Osmolality: 281 (ref 275–301)
Potassium: 3.8 mmol/L (ref 3.5–5.1)

## 2013-06-25 LAB — CK TOTAL AND CKMB (NOT AT ARMC)
CK, Total: 257 U/L — ABNORMAL HIGH (ref 35–232)
CK, Total: 238 U/L — ABNORMAL HIGH (ref 35–232)
CK-MB: 6.5 ng/mL — ABNORMAL HIGH (ref 0.5–3.6)
CK-MB: 4.8 ng/mL — ABNORMAL HIGH (ref 0.5–3.6)

## 2013-06-25 LAB — TROPONIN I: Troponin-I: 3.57 ng/mL — ABNORMAL HIGH

## 2013-06-26 ENCOUNTER — Inpatient Hospital Stay: Payer: Self-pay | Admitting: Internal Medicine

## 2013-06-26 LAB — CBC WITH DIFFERENTIAL/PLATELET
Basophil %: 0.6 %
Eosinophil #: 0.1 10*3/uL (ref 0.0–0.7)
Lymphocyte %: 13.7 %
MCH: 29.2 pg (ref 26.0–34.0)
MCHC: 34.6 g/dL (ref 32.0–36.0)
Monocyte #: 0.7 x10 3/mm (ref 0.2–1.0)
Platelet: 169 10*3/uL (ref 150–440)
RDW: 15.1 % — ABNORMAL HIGH (ref 11.5–14.5)

## 2013-06-26 LAB — BASIC METABOLIC PANEL
Anion Gap: 4 — ABNORMAL LOW (ref 7–16)
BUN: 12 mg/dL (ref 7–18)
Calcium, Total: 8.6 mg/dL (ref 8.5–10.1)
Chloride: 107 mmol/L (ref 98–107)
Co2: 29 mmol/L (ref 21–32)
Creatinine: 1.44 mg/dL — ABNORMAL HIGH (ref 0.60–1.30)
EGFR (African American): 57 — ABNORMAL LOW
Osmolality: 279 (ref 275–301)
Sodium: 140 mmol/L (ref 136–145)

## 2013-06-27 LAB — CBC WITH DIFFERENTIAL/PLATELET
Basophil %: 1 %
HCT: 39.5 % — ABNORMAL LOW (ref 40.0–52.0)
Lymphocyte #: 1.4 10*3/uL (ref 1.0–3.6)
Monocyte %: 10.4 %
Neutrophil #: 5.3 10*3/uL (ref 1.4–6.5)
Neutrophil %: 67.6 %
Platelet: 179 10*3/uL (ref 150–440)
RDW: 15.5 % — ABNORMAL HIGH (ref 11.5–14.5)
WBC: 7.9 10*3/uL (ref 3.8–10.6)

## 2013-06-27 LAB — BASIC METABOLIC PANEL
Anion Gap: 4 — ABNORMAL LOW (ref 7–16)
Calcium, Total: 8.8 mg/dL (ref 8.5–10.1)
Creatinine: 1.44 mg/dL — ABNORMAL HIGH (ref 0.60–1.30)
EGFR (African American): 57 — ABNORMAL LOW
Glucose: 94 mg/dL (ref 65–99)

## 2013-06-28 LAB — BASIC METABOLIC PANEL
Co2: 24 mmol/L (ref 21–32)
Creatinine: 1.16 mg/dL (ref 0.60–1.30)
EGFR (African American): >60
Glucose: 139 mg/dL — ABNORMAL HIGH (ref 65–99)

## 2013-07-25 ENCOUNTER — Encounter: Payer: Self-pay | Admitting: Internal Medicine

## 2013-08-13 ENCOUNTER — Encounter: Payer: Self-pay | Admitting: Internal Medicine

## 2014-12-19 ENCOUNTER — Encounter: Payer: Self-pay | Admitting: Orthopedic Surgery

## 2015-01-13 ENCOUNTER — Encounter: Payer: Self-pay | Admitting: Orthopedic Surgery

## 2015-02-11 ENCOUNTER — Encounter: Payer: Self-pay | Admitting: Orthopedic Surgery

## 2015-04-04 NOTE — Discharge Summary (Signed)
PATIENT NAME:  Keith Williamson, Keith Williamson MR#:  [id number] DATE OF BIRTH:  18-Feb-1946  DATE OF ADMISSION:  06/26/2013 DATE OF DISCHARGE:  06/28/2013  DIAGNOSES AT TIME OF DISCHARGE: 1.  Chest pain with non-ST-elevation myocardial infarction.  2.  Percutaneous intervention with stent placement right coronary artery.  3.  Hypertension.  4.  Benign prostatic hypertrophy.  5.  Chronic back pain.  6.  History of thyroid cancer, status post thyroidectomy.   CHIEF COMPLAINT: Chest pain.   HISTORY OF PRESENT ILLNESS: Bynum Mccullars is a 69 year old African American male with a past medical history hypertension, surgical hypothyroidism, gastroesophageal reflux disease who presented to the ED complaining of chest pain. The patient also has been complaining of a generalized sense of weakness associated with cough and the patient was noted to have an elevated blood pressure initially 200 to 108 and after nitroglycerin, blood pressure improved to 160/80. He denies any nausea, vomiting, or lightheadedness.   PAST MEDICAL HISTORY: Benign prostatic hypertrophy, gout, history of thyroid cancer, status post thyroidectomy, spinal stenosis hypertension lumbar disk diskectomy and gastroesophageal reflux disease.   PHYSICAL EXAMINATION: GENERAL: He was not in distress.  VITAL SIGNS: Temperature was 98.1, pulse 96, blood pressure 160/90, oxygen saturation 98% on room air.  HEENT: Normocephalic, atraumatic.  NECK: Supple. No JVD.  HEART: S1, S2.  LUNGS: Clear.  ABDOMEN: Soft, nontender.  EXTREMITIES: No edema.   NEUROLOGIC: Nonfocal.   LABORATORY, DIAGNOSTIC AND RADIOLOGIC DATA: The patient's initial CK was 304. Troponin 0.7. It subsequently went up to 3.57 CPK MB 4.9, went up to 6.5. The patient was seen by cardiologist, Dr. Nehemiah Massed, and underwent a cardiac catheterization which showed evidence for three-vessel disease and distal right coronary stenosis.  The proximal left main showed a 20% stenosis of the proximal  LAD, showed 50% stenosis, distal LAD showed 85% stenosis and proximal circumflex showed 30% stenosis first obtuse marginal showed 70% stenosis, the proximal RCA 50% stenosis, mid RCA 30% stenosis, distal RCA showed 90% stenosis. Right PDA showed 40% stenosis. Posterior lateral segment showed 70% stenosis. The patient subsequently underwent PCI of the right coronary artery. He was also noted to be tachycardic and he started on Cardizem. He states that he was intolerant to beta blockers in the past.   HOSPITAL COURSE:  During his stay in the hospital, the patient did well. He was ambulated and was chest pain-free. He was continued on his blood pressure medications and discharged home on the following medications:  Proscar 5 mg once a day, doxazosin 4 mg 2 tabs a day, levothyroxine 137 mcg a day, losartan 100 mg once a day, Plavix 75 mg once a day, aspirin 325 mg a day, nitroglycerin 0.4 mg sublingual p.r.n., Cardizem CD 120 mg once a day, atorvastatin 10 mg once a day.   FOLLOWUP: The patient has been advised to follow with me, Dr. Tracie Harrier, and also follow up with his cardiologist, Dr. Serafina Royals, in 1 to 2 weeks' time. He has been advised to report to ER or call us if he has any worsening chest pain at any time.   TOTAL TIME SPENT IN DISCHARGE: 35 minutes  ____________________________ Tracie Harrier, MD vh:cc D: 06/28/2013 13:21:26 ET T: 06/28/2013 14:48:58 ET JOB#: [id number]  cc: Tracie Harrier, MD, <Dictator> Tracie Harrier MD ELECTRONICALLY SIGNED 07/05/2013 19:07

## 2015-04-04 NOTE — H&P (Signed)
PATIENT NAME:  Keith Williamson, Keith Williamson MR#:  [id number] DATE OF BIRTH:  02-May-1946  DATE OF ADMISSION:  06/25/2013  PRIMARY CARE PHYSICIAN: Dr. Ginette Pitman.    REFERRING PHYSICIAN: Dr. Lurline Hare.   CHIEF COMPLAINT: Chest pain and hypertension uncontrolled.   HISTORY OF PRESENT ILLNESS: Keith Williamson is a 69 year old, pleasant, African American male with past medical history of hypertension, hypothyroidism, gastroesophageal reflux disease, obesity, presented to the Emergency Department with a complaint of chest pain for the last 3 days. The patient had cold-like symptoms about 4 weeks back. Was seen by a local doctor at. Stated that the patient did not have any pneumonia. The patient continued to have the cough with severe generalized weakness for the last 1 month. Has been experiencing mild chest pain on and off. For the last 3 days, the pain has been persistent and on the left side of the chest, dull aching pain, radiating to the left arm. Concerning this, EMS was called, and he was brought to the Emergency Department. The patient's initial blood pressure was 202/108. The patient received nitroglycerin with improvement of the blood pressure to 160/80. The patient currently denies having any chest pain. The patient is unable to tell the exacerbating and relieving factors. Denies having any nausea, vomiting, lightheadedness. This was associated with some shortness of breath.   PAST MEDICAL HISTORY:  1. Benign prostatic hypertrophy.  2. Gout.  3. History of thyroid cancer, status post thyroidectomy.  4. Spinal stenosis.   5. Hypertension.  6. Lumbar diskectomy.   7. Gastroesophageal reflux disease.    ALLERGIES:  1. [place].  2. HYDROCHLOROTHIAZIDE.  3. NARCOTICS AND ANALGESICS.  4. BETA BLOCKERS.  5. FLU VACCINE.   HOME MEDICATIONS:  1. Proscar 5 mg once a day.  2. Losartan 100 mg once a day.  3. Levothyroxine 137 mcg a day.  4. Doxazosin 40 mg 2 times a day.   SOCIAL HISTORY: Quit smoking in 1992.  Denies drinking alcohol or using illicit drugs. Married, lives with his wife.   FAMILY HISTORY: Mother died from cancer. Father died from heart attack.   REVIEW OF SYSTEMS:  CONSTITUTIONAL: Has been experiencing severe generalized weakness. No loss of weight.   EYES: No change in vision.  ENT: No change in hearing.   RESPIRATORY: Has cough, mild shortness of breath.  CARDIOVASCULAR: Has been experiencing chest pain for the last 3 days.  GASTROINTESTINAL: No nausea, vomiting, abdominal pain. Has regular bowel movements.  GENITOURINARY: No dysuria or hematuria.  SKIN: No rash or lesions.   HEMATOLOGIC: No easy bruising or bleeding.  MUSCULOSKELETAL: Has chronic back pain.  NEUROLOGIC: No weakness or numbness in any part of the body.   PHYSICAL EXAMINATION:  GENERAL: This is a well-built, well-nourished, age-appropriate male lying down in the bed, not in distress.  VITAL SIGNS: Temperature 98.1, pulse 96, blood pressure , respiratory rate of 12, oxygen saturation is 98% on room air.  HEENT: Head normocephalic, atraumatic. There is no scleral icterus. Conjunctivae normal. Pupils equal and react to light. Extraocular movements are intact. Mucous membranes moist. No pharyngeal erythema.  NECK: Supple. No lymphadenopathy. No JVD. No carotid bruit. No thyromegaly.  CHEST: Has mild focal tenderness on the left lateral aspect of the chest.  LUNGS: Bilaterally clear to auscultation.  HEART: S1, S2 regular. No murmurs are heard. No pedal edema. Pulses 2+.  ABDOMEN: Obese. Bowel sounds present. Soft, nontender, nondistended. Could not appreciate any hepatosplenomegaly.  SKIN: No rash or lesions.  MUSCULOSKELETAL: Good range of motion  in all of the extremities.   LABS: CBC is completely within normal limits.   CMP is completely within normal limits.   Troponin 0.70, CK-MB 4.9, CK 304.   EKG, 12-lead: Normal sinus rhythm with no ST-T wave abnormalities.   ASSESSMENT AND PLAN: Keith Williamson is a  69 year old male who comes to the Emergency Department with complaints of chest pain.  1. Chest pain: Seems to be more of a musculoskeletal pain. The patient has elevated CK. Has mild elevation of the troponin of 0.7. Has elevation of the CK-MB. Will admit the patient to the monitored bed. Continue to cycle cardiac enzymes x3. Will obtain stress test in the morning.  2. Hypertension, poorly controlled: This could be secondary to stress and pain. Will continue to follow up.  3. Hypothyroidism: Continue Synthroid.  4. Keep the patient on deep vein thrombosis prophylaxis with Lovenox.   TIME SPENT: 45 minutes.   ____________________________ Monica Becton, MD pv:gb D: 06/25/2013 00:23:00 ET T: 06/25/2013 02:23:43 ET JOB#: [id number]  cc: Monica Becton, MD, <Dictator> Tracie Harrier, MD Monica Becton MD ELECTRONICALLY SIGNED 07/11/2013 0:26

## 2015-04-04 NOTE — Consult Note (Signed)
PATIENT NAME:  Keith Williamson, Keith Williamson MR#:  [id number] DATE OF BIRTH:  1946-11-05  DATE OF CONSULTATION:  06/25/2013  REFERRING PHYSICIAN:  Tracie Harrier, MD CONSULTING PHYSICIAN:  Corey Skains, MD  PRIMARY CARE PHYSICIAN:  Tracie Harrier, MD  REASON FOR CONSULTATION:  Chest pain, shortness breath, hypertension and hyperlipidemia with acute subendocardial myocardial infarction.   CHIEF COMPLAINT: " I had chest pain and numbness".   HISTORY OF PRESENT ILLNESS: This is a 69 year old male with known hypertension and hyperlipidemia on appropriate medication management and stable at this time with waxing and waning chest discomfort, substernal in nature, radiating into his back and to his arms with shortness of breath and weakness over the last 4 to 5 days culminating in resting chest discomfort lasting approximately 10 to 15 minutes. Upon arrival to the Emergency Room, he had EKG showing normal sinus rhythm and no evidence of acute myocardial infarction. He has also had a troponin level of 1.92 consistent with subendocardial myocardial infarction. He has been chest pain free since nitroglycerin and oxygen and therefore the patient is stable on Lovenox as well. The patient has known hypertension and hyperlipidemia, on appropriate medication, and currently stable at this time.   REVIEW OF SYSTEMS: Negative for syncope, dizziness, nausea, diaphoresis, weakness, fatigue, vision change,  ringing in the ears, hearing loss, cough, congestion, heartburn, diarrhea, bloody stools, stomach pain, extremity pain, leg weakness, cramping of the buttocks, known blood clots, headaches, blackouts, dizzy spells, nosebleeds, congestion, trouble swallowing, frequent urination, urination at night, muscle weakness, numbness, anxiety, depression, skin lesions or skin rashes.   PAST MEDICAL HISTORY: 1.  Hypertension.  2.  Hyperlipidemia.   FAMILY HISTORY: No family members with early onset of cardiovascular disease.    SOCIAL HISTORY: Currently denies alcohol or tobacco use.   ALLERGIES: As listed.   MEDICATIONS: As listed.   PHYSICAL EXAMINATION: VITAL SIGNS: Blood pressure is 160/72 bilaterally and heart rate is 62 upright and reclining, and regular.  GENERAL: He is a well appearing male in no acute distress.  HEENT: No icterus, thyromegaly, ulcers, hemorrhage or xanthelasma.  CARDIOVASCULAR: Regular rate and rhythm with normal S1 and S2 without murmur, gallop or rub. PMI is normal size and placement. Carotid upstroke is normal without bruit. Jugular venous pressure is normal.  LUNGS: Clear to auscultation with normal respirations.  ABDOMEN: Soft and nontender without hepatosplenomegaly or masses. Abdominal aorta is normal size without bruit.  EXTREMITIES: Show 2+ bilateral pulses in dorsal, pedal, radial and femoral arteries without lower extremity edema, cyanosis, clubbing or ulcers.  NEUROLOGIC: He is oriented to time, place and person with normal mood and affect.   ASSESSMENT: A 69 year old male with hypertension, hyperlipidemia and new onset substernal chest pain with subendocardial myocardial infarction consistent with coronary artery disease and now further stable on appropriate medication management.   RECOMMENDATIONS: 1.  Continue heparin, nitroglycerin, oxygen, aspirin and beta blocker as necessary.  2.  Continue serial ECG and enzymes to assess for myocardial infarction extent.  3.  Echocardiogram for left ventricle systolic dysfunction, valvular heart disease.  4.  Proceed to cardiac catheterization to assess coronary anatomy and further treatment thereof as necessary. The patient understands the risks and benefits of cardiac catheterization. These include the possibility of death, stroke, heart attack, infection, bleeding or blood clot. He is at low risk for conscious sedation. ____________________________ Corey Skains, MD bjk:sb D: 06/25/2013 08:45:08 ET T: 06/25/2013 09:07:58  ET JOB#: [id number]  cc: Corey Skains, MD, <Dictator> Corey Skains MD  ELECTRONICALLY SIGNED 06/26/2013 8:25

## 2016-05-06 ENCOUNTER — Encounter: Payer: Self-pay | Admitting: *Deleted

## 2016-05-07 ENCOUNTER — Ambulatory Visit
Admission: RE | Admit: 2016-05-07 | Discharge: 2016-05-07 | Disposition: A | Discharge status: Home / Self Care | Payer: Medicare Other | Source: Ambulatory Visit | Attending: Gastroenterology | Admitting: Gastroenterology

## 2016-05-07 ENCOUNTER — Encounter
Admission: RE | Disposition: A | Discharge status: Home / Self Care | Payer: Self-pay | Source: Ambulatory Visit | Attending: Gastroenterology

## 2016-05-07 ENCOUNTER — Ambulatory Visit: Payer: Medicare Other | Admitting: Anesthesiology

## 2016-05-07 ENCOUNTER — Encounter: Payer: Self-pay | Admitting: *Deleted

## 2016-05-07 DIAGNOSIS — K5730 Diverticulosis of large intestine without perforation or abscess without bleeding: Secondary | ICD-10-CM | POA: Diagnosis not present

## 2016-05-07 DIAGNOSIS — I10 Essential (primary) hypertension: Secondary | ICD-10-CM | POA: Diagnosis not present

## 2016-05-07 DIAGNOSIS — N400 Enlarged prostate without lower urinary tract symptoms: Secondary | ICD-10-CM | POA: Diagnosis not present

## 2016-05-07 DIAGNOSIS — Z1211 Encounter for screening for malignant neoplasm of colon: Secondary | ICD-10-CM | POA: Insufficient documentation

## 2016-05-07 DIAGNOSIS — K2950 Unspecified chronic gastritis without bleeding: Secondary | ICD-10-CM | POA: Insufficient documentation

## 2016-05-07 DIAGNOSIS — K2980 Duodenitis without bleeding: Secondary | ICD-10-CM | POA: Insufficient documentation

## 2016-05-07 DIAGNOSIS — K219 Gastro-esophageal reflux disease without esophagitis: Secondary | ICD-10-CM | POA: Diagnosis present

## 2016-05-07 DIAGNOSIS — D123 Benign neoplasm of transverse colon: Secondary | ICD-10-CM | POA: Diagnosis not present

## 2016-05-07 DIAGNOSIS — E039 Hypothyroidism, unspecified: Secondary | ICD-10-CM | POA: Insufficient documentation

## 2016-05-07 DIAGNOSIS — E785 Hyperlipidemia, unspecified: Secondary | ICD-10-CM | POA: Diagnosis not present

## 2016-05-07 DIAGNOSIS — Z79899 Other long term (current) drug therapy: Secondary | ICD-10-CM | POA: Insufficient documentation

## 2016-05-07 DIAGNOSIS — M1990 Unspecified osteoarthritis, unspecified site: Secondary | ICD-10-CM | POA: Diagnosis not present

## 2016-05-07 DIAGNOSIS — Z800 Family history of malignant neoplasm of digestive organs: Secondary | ICD-10-CM | POA: Insufficient documentation

## 2016-05-07 DIAGNOSIS — Z85850 Personal history of malignant neoplasm of thyroid: Secondary | ICD-10-CM | POA: Insufficient documentation

## 2016-05-07 DIAGNOSIS — Z7982 Long term (current) use of aspirin: Secondary | ICD-10-CM | POA: Insufficient documentation

## 2016-05-07 DIAGNOSIS — K210 Gastro-esophageal reflux disease with esophagitis: Secondary | ICD-10-CM | POA: Insufficient documentation

## 2016-05-07 HISTORY — DX: Benign prostatic hyperplasia without lower urinary tract symptoms: N40.0

## 2016-05-07 HISTORY — DX: Endocarditis, valve unspecified: I38

## 2016-05-07 HISTORY — PX: COLONOSCOPY WITH PROPOFOL: SHX5780

## 2016-05-07 HISTORY — DX: Hyperlipidemia, unspecified: E78.5

## 2016-05-07 HISTORY — DX: Round hole, unspecified eye: H33.329

## 2016-05-07 HISTORY — DX: Gastro-esophageal reflux disease without esophagitis: K21.9

## 2016-05-07 HISTORY — DX: Unspecified osteoarthritis, unspecified site: M19.90

## 2016-05-07 HISTORY — DX: Age-related nuclear cataract, unspecified eye: H25.10

## 2016-05-07 HISTORY — PX: ESOPHAGOGASTRODUODENOSCOPY (EGD) WITH PROPOFOL: SHX5813

## 2016-05-07 HISTORY — DX: Personal history of malignant neoplasm of thyroid: Z85.850

## 2016-05-07 HISTORY — DX: Disorder of thyroid, unspecified: E07.9

## 2016-05-07 HISTORY — DX: Benign neoplasm of thyroid gland: D34

## 2016-05-07 SURGERY — COLONOSCOPY WITH PROPOFOL
Anesthesia: General

## 2016-05-07 MED ORDER — PHENYLEPHRINE HCL 10 MG/ML IJ SOLN
INTRAMUSCULAR | Status: DC | PRN
  Administered 2016-05-07 (×2): 100 ug via INTRAVENOUS
  Administered 2016-05-07 (×3): 50 ug via INTRAVENOUS

## 2016-05-07 MED ORDER — GLYCOPYRROLATE 0.2 MG/ML IJ SOLN
INTRAMUSCULAR | Status: DC | PRN
  Administered 2016-05-07: 0.2 mg via INTRAVENOUS

## 2016-05-07 MED ORDER — SODIUM CHLORIDE 0.9 % IV SOLN: INTRAVENOUS | Status: DC

## 2016-05-07 MED ORDER — PROPOFOL 10 MG/ML IV BOLUS
INTRAVENOUS | Status: DC | PRN
  Administered 2016-05-07 (×2): 50 mg via INTRAVENOUS

## 2016-05-07 MED ORDER — PROPOFOL 500 MG/50ML IV EMUL
INTRAVENOUS | Status: DC | PRN
  Administered 2016-05-07: 85 ug/kg/min via INTRAVENOUS

## 2016-05-07 MED ORDER — SODIUM CHLORIDE 0.9 % IV SOLN
INTRAVENOUS | Status: DC
  Administered 2016-05-07: 13:00:00 via INTRAVENOUS

## 2016-05-07 MED ORDER — MIDAZOLAM HCL 2 MG/2ML IJ SOLN
INTRAMUSCULAR | Status: DC | PRN
  Administered 2016-05-07: 1 mg via INTRAVENOUS
  Administered 2016-05-07 (×2): 0.5 mg via INTRAVENOUS

## 2016-05-07 MED ORDER — FENTANYL CITRATE (PF) 100 MCG/2ML IJ SOLN
INTRAMUSCULAR | Status: DC | PRN
  Administered 2016-05-07 (×2): 50 ug via INTRAVENOUS

## 2016-05-07 NOTE — Transfer of Care (Signed)
Immediate Anesthesia Transfer of Care Note  Patient: Coletin Sigrist  Procedure(s) Performed: Procedure(s): COLONOSCOPY WITH PROPOFOL (N/A) ESOPHAGOGASTRODUODENOSCOPY (EGD) WITH PROPOFOL (N/A)  Patient Location: PACU  Anesthesia Type:General  Level of Consciousness: sedated  Airway & Oxygen Therapy: Patient Spontanous Breathing and Patient connected to nasal cannula oxygen  Post-op Assessment: Report given to RN and Post -op Vital signs reviewed and stable  Post vital signs: Reviewed and stable  Last Vitals:  Filed Vitals:   05/07/16 1306  BP: 147/97  Temp: 36.4 C  Resp: 18    Last Pain: There were no vitals filed for this visit.       Complications: No apparent anesthesia complications

## 2016-05-07 NOTE — H&P (Signed)
Outpatient short stay form Pre-procedure 05/07/2016 1:49 PM Lollie Sails MD  Primary Physician: Dr Tracie Harrier  Reason for visit:  EGD and colonoscopy  History of present illness:  Patient is a 70 year old male presenting today as above. He has a personal history of reflux the symptoms seemingly getting worse over the past several weeks. He was given a course of twice a day PPI for about 2 weeks that he states did not seem to help very much. He does not have dysphagia per se however a sensation of swallowing. He seems to be worse when he refluxes more. Is also seems to be worse with liquids. He is also having a colonoscopy done today due to family history of colon cancer in his sister. Patient's last colonoscopy was in 2008. There were no polyps at that time. He did have some small internal hemorrhoids. He tolerated his prep well. He denies use of any aspirin with the exception of 1 mg aspirin she takes daily. The last time was early this morning. He is on no blood thinning agents.    Current facility-administered medications:  .  0.9 %  sodium chloride infusion, , Intravenous, Continuous, Lollie Sails, MD, Last Rate: 20 mL/hr at 05/07/16 1329 .  0.9 %  sodium chloride infusion, , Intravenous, Continuous, Lollie Sails, MD  Prescriptions prior to admission  Medication Sig Dispense Refill Last Dose  . aspirin 81 MG tablet Take 81 mg by mouth daily.     . cyclobenzaprine (FLEXERIL) 5 MG tablet Take 5 mg by mouth 3 (three) times daily as needed for muscle spasms.     Marland Kitchen diltiazem (CARDIZEM CD) 240 MG 24 hr capsule Take 240 mg by mouth daily.   05/07/2016 at 0800  . fluticasone (FLONASE) 50 MCG/ACT nasal spray Place 1 spray into both nostrils daily as needed for allergies or rhinitis.     Marland Kitchen HYDROcodone-acetaminophen (NORCO/VICODIN) 5-325 MG tablet Take 1 tablet by mouth every 4 (four) hours as needed for moderate pain.     Marland Kitchen levothyroxine (SYNTHROID, LEVOTHROID) 137 MCG tablet Take  137 mcg by mouth daily.   05/07/2016 at 0800  . losartan (COZAAR) 100 MG tablet Take 100 mg by mouth daily.     . nitroGLYCERIN (NITROSTAT) 0.4 MG SL tablet Place 0.4 mg under the tongue every 5 (five) minutes as needed for chest pain.     . ranitidine (ZANTAC) 150 MG tablet Take 150 mg by mouth 2 (two) times daily.     . rosuvastatin (CRESTOR) 40 MG tablet Take 40 mg by mouth daily.     . Azilsartan Medoxomil (EDARBI) 40 MG TABS Take 40 mg by mouth daily.   08/15/2012 at Unknown  . doxazosin (CARDURA) 4 MG tablet Take 4 mg by mouth 2 (two) times daily.   08/16/2012 at Unknown  . finasteride (PROSCAR) 5 MG tablet Take 5 mg by mouth daily.   08/16/2012 at Unknown     Allergies  Allergen Reactions  . Beta Adrenergic Blockers Other (See Comments)    unknown  . Hydralazine   . Hydrochlorothiazide Other (See Comments)    "draws out too much fluid"  . Norvasc [Amlodipine] Swelling    Tongue swelling  . Other     Influenza virus vaccine tvs 2012-13 (18+) cell der     Past Medical History  Diagnosis Date  . Hypertension   . Hypothyroidism   . Headache(784.0)     frequent sinus headache  . Cancer ([place])  hx thyroid cancer with iodine tx  . Arthritis   . BPH (benign prostatic hypertrophy)   . Enlarged prostate   . GERD (gastroesophageal reflux disease)   . Hurthle cell tumor   . Hx of thyroid cancer   . Hyperlipidemia   . Retina hole   . Senile nuclear sclerosis   . Thyroid disease   . Valvular heart disease     Review of systems:      Physical Exam    Heart and lungs: Regular rate and rhythm without rub or gallop, lungs are bilaterally clear.    HEENT: Normocephalic atraumatic eyes are anicteric    Other:     Pertinant exam for procedure: Soft nontender nondistended bowel sounds are positive normoactive.    Planned proceedures: EGD and colonoscopy. I have discussed the risks benefits and complications of procedures to include not limited to bleeding, infection,  perforation and the risk of sedation and the patient wishes to proceed.    Lollie Sails, MD Gastroenterology 05/07/2016  1:49 PM

## 2016-05-07 NOTE — Op Note (Signed)
[facility] Patient Name: Keith Williamson Procedure Date: 05/07/2016 1:51 PM MRN: [id number] Account #: [id number] Date of Birth: 03/09/1946 Admit Type: Outpatient Age: 70 Room: [facility] ENDO ROOM 1 Gender: Male Note Status: Finalized Procedure:            Upper GI endoscopy Indications:          Gastro-esophageal reflux disease, Failure to respond to                        medical treatment Providers:            Lollie Sails, MD Referring MD:         No Local Md, MD (Referring MD) Medicines:            Monitored Anesthesia Care Complications:        No immediate complications. Procedure:            Pre-Anesthesia Assessment:                       - ASA Grade Assessment: III - A patient with severe                        systemic disease.                       After obtaining informed consent, the endoscope was                        passed under direct vision. Throughout the procedure,                        the patient's blood pressure, pulse, and oxygen                        saturations were monitored continuously. The                        Colonoscope was introduced through the mouth, and                        advanced to the third part of duodenum. The upper GI                        endoscopy was performed with moderate difficulty due to                        challenging esophageal intubation because of poor                        visualization. Successful completion of the procedure                        was aided by suction, clearence of mucus. The patient                        tolerated the procedure well. Findings:      LA Grade B (one or more mucosal breaks greater than 5 mm, not extending       between the tops of two mucosal folds) esophagitis with no bleeding was       found. Biopsies were taken  with a cold forceps for histology.      The entire examined stomach was normal. Biopsies were taken with a cold       forceps for  histology. Biopsies were taken with a cold forceps for       Helicobacter pylori testing.      Patchy mild inflammation characterized by erythema and granularity was       found in the duodenal bulb and in the second portion of the duodenum.      The exam of the duodenum was otherwise normal.      The cardia and gastric fundus were normal on retroflexion. Impression:           - LA Grade B reflux esophagitis. Biopsied.                       - Normal stomach. Biopsied.                       - Duodenitis. Recommendation:       - Use Protonix (pantoprazole) 40 mg PO BID.                       - Return to GI clinic in 1 month.                       - Await pathology results.                       - Telephone GI clinic for pathology results in 1 week. Procedure Code(s):    --- Professional ---                       [phone], Esophagogastroduodenoscopy, flexible, transoral;                        with biopsy, single or multiple Diagnosis Code(s):    --- Professional ---                       K21.0, Gastro-esophageal reflux disease with esophagitis                       K29.80, Duodenitis without bleeding CPT copyright 2016 [employer]. All rights reserved. The codes documented in this report are preliminary and upon coder review may  be revised to meet current compliance requirements. Lollie Sails, MD 05/07/2016 2:31:07 PM This report has been signed electronically. Number of Addenda: 0 Note Initiated On: 05/07/2016 1:51 PM      [facility] [place]

## 2016-05-07 NOTE — Op Note (Signed)
[facility] Gastroenterology Patient Name: Keith Williamson Procedure Date: 05/07/2016 1:50 PM MRN: [id number] Account #: [id number] Date of Birth: 01/24/1946 Admit Type: Outpatient Age: 70 Room: [facility] ENDO ROOM 1 Gender: Male Note Status: Finalized Procedure:            Colonoscopy Indications:          Screening in patient at increased risk: Family history                        of 1st-degree relative with colorectal cancer Providers:            Lollie Sails, MD Referring MD:         No Local Md, MD (Referring MD) Medicines:            Monitored Anesthesia Care Complications:        No immediate complications. Procedure:            Pre-Anesthesia Assessment:                       - ASA Grade Assessment: III - A patient with severe                        systemic disease.                       After obtaining informed consent, the colonoscope was                        passed under direct vision. Throughout the procedure,                        the patient's blood pressure, pulse, and oxygen                        saturations were monitored continuously. The                        Colonoscope was introduced through the anus and                        advanced to the the cecum, identified by appendiceal                        orifice and ileocecal valve. The colonoscopy was                        performed without difficulty. The patient tolerated the                        procedure well. The quality of the bowel preparation                        was good. Findings:      A 3 mm polyp was found in the hepatic flexure. The polyp was sessile.       The polyp was removed with a cold biopsy forceps. Resection and       retrieval were complete.      A few small-mouthed diverticula were found in the sigmoid colon and       descending colon.      The retroflexed view of  the distal rectum and anal verge was normal and       showed no anal or rectal  abnormalities.      The exam was otherwise without abnormality.      The digital rectal exam was normal. Impression:           - One 3 mm polyp at the hepatic flexure, removed with a                        cold biopsy forceps. Resected and retrieved.                       - Diverticulosis in the sigmoid colon and in the                        descending colon.                       - The distal rectum and anal verge are normal on                        retroflexion view.                       - The examination was otherwise normal. Recommendation:       - Discharge patient to home.                       - Return to GI office in 1 month.                       - Telephone GI clinic for pathology results in 1 week. Procedure Code(s):    --- Professional ---                       [phone], Colonoscopy, flexible; with biopsy, single or                        multiple Diagnosis Code(s):    --- Professional ---                       Z80.0, Family history of malignant neoplasm of                        digestive organs                       D12.3, Benign neoplasm of transverse colon (hepatic                        flexure or splenic flexure)                       K57.30, Diverticulosis of large intestine without                        perforation or abscess without bleeding CPT copyright 2016 [employer]. All rights reserved. The codes documented in this report are preliminary and upon coder review may  be revised to meet current compliance requirements. Lollie Sails, MD 05/07/2016 2:55:27 PM This report has been signed electronically. Number of Addenda: 0 Note Initiated On: 05/07/2016 1:50 PM Scope  Withdrawal Time: 0 hours 7 minutes 6 seconds  Total Procedure Duration: 0 hours 14 minutes 1 second       [facility]

## 2016-05-07 NOTE — Anesthesia Preprocedure Evaluation (Signed)
Anesthesia Evaluation  Patient identified by MRN, date of birth, ID band Patient awake    Reviewed: Allergy & Precautions, H&P , NPO status , Patient's Chart, lab work & pertinent test results  History of Anesthesia Complications Negative for: history of anesthetic complications  Airway Mallampati: III  TM Distance: >3 FB Neck ROM: limited    Dental  (+) Poor Dentition, Chipped   Pulmonary neg shortness of breath, former smoker,    Pulmonary exam normal breath sounds clear to auscultation       Cardiovascular Exercise Tolerance: Good hypertension, (-) angina+ CAD and + Cardiac Stents  (-) DOE Normal cardiovascular exam Rhythm:regular Rate:Normal     Neuro/Psych  Headaches, negative psych ROS   GI/Hepatic Neg liver ROS, GERD  Controlled,  Endo/Other  Hypothyroidism   Renal/GU negative Renal ROS  negative genitourinary   Musculoskeletal  (+) Arthritis ,   Abdominal   Peds  Hematology negative hematology ROS (+)   Anesthesia Other Findings Past Medical History:   Hypertension                                                 Hypothyroidism                                               Headache(784.0)                                                Comment:frequent sinus headache   Cancer (HCC)                                                   Comment:hx thyroid cancer with iodine tx   Arthritis                                                    BPH (benign prostatic hypertrophy)                           Enlarged prostate                                            GERD (gastroesophageal reflux disease)                       Hurthle cell tumor                                           Hx of thyroid cancer  Hyperlipidemia                                               Retina hole                                                  Senile nuclear sclerosis                                      Thyroid disease                                              Valvular heart disease                                      Past Surgical History:   KNEE ARTHROSCOPY                                Left                Comment:Medial and lateral meniscectomy, partial   TOTAL THYROIDECTOMY                                           BACK SURGERY                                                  LUMBAR LAMINECTOMY                               08/16/2012      Comment:L 3 4 & 5   ANGIOPLASTY / STENTING FEMORAL                                TURP VAPORIZATION                                             LUMBAR MICRODISCECTOMY                                          Comment:L3-L4; with partial hemilaminectomy, partial               facetectomy and foraminotomy  BMI    Body Mass Index   30.03 kg/m 2      Reproductive/Obstetrics negative OB ROS  Anesthesia Physical Anesthesia Plan  ASA: III  Anesthesia Plan: General   Post-op Pain Management:    Induction:   Airway Management Planned:   Additional Equipment:   Intra-op Plan:   Post-operative Plan:   Informed Consent: I have reviewed the patients History and Physical, chart, labs and discussed the procedure including the risks, benefits and alternatives for the proposed anesthesia with the patient or authorized representative who has indicated his/her understanding and acceptance.   Dental Advisory Given  Plan Discussed with: Anesthesiologist, CRNA and Surgeon  Anesthesia Plan Comments:         Anesthesia Quick Evaluation

## 2016-05-08 NOTE — Anesthesia Postprocedure Evaluation (Signed)
Anesthesia Post Note  Patient: Rodgerick Isaak  Procedure(s) Performed: Procedure(s) (LRB): COLONOSCOPY WITH PROPOFOL (N/A) ESOPHAGOGASTRODUODENOSCOPY (EGD) WITH PROPOFOL (N/A)  Patient location during evaluation: Endoscopy Anesthesia Type: General Level of consciousness: awake and alert Pain management: pain level controlled Vital Signs Assessment: post-procedure vital signs reviewed and stable Respiratory status: spontaneous breathing, nonlabored ventilation, respiratory function stable and patient connected to nasal cannula oxygen Cardiovascular status: blood pressure returned to baseline and stable Postop Assessment: no signs of nausea or vomiting Anesthetic complications: no    Last Vitals:  Filed Vitals:   05/07/16 1515 05/07/16 1525  BP: 108/82 122/93  Pulse: 76 79  Temp:    Resp: 21 13    Last Pain: There were no vitals filed for this visit.               Precious Haws Markan Cazarez

## 2016-05-10 ENCOUNTER — Encounter: Payer: Self-pay | Admitting: Gastroenterology

## 2016-05-12 LAB — SURGICAL PATHOLOGY

## 2016-05-19 ENCOUNTER — Other Ambulatory Visit: Payer: Self-pay | Admitting: Gastroenterology

## 2016-05-19 DIAGNOSIS — K219 Gastro-esophageal reflux disease without esophagitis: Secondary | ICD-10-CM

## 2016-05-19 DIAGNOSIS — R110 Nausea: Secondary | ICD-10-CM

## 2016-06-08 ENCOUNTER — Ambulatory Visit
Admission: RE | Admit: 2016-06-08 | Discharge: 2016-06-08 | Disposition: A | Discharge status: Home / Self Care | Payer: Medicare Other | Source: Ambulatory Visit | Attending: Gastroenterology | Admitting: Gastroenterology

## 2016-06-08 DIAGNOSIS — K219 Gastro-esophageal reflux disease without esophagitis: Secondary | ICD-10-CM | POA: Diagnosis not present

## 2016-06-08 DIAGNOSIS — R110 Nausea: Secondary | ICD-10-CM | POA: Insufficient documentation

## 2016-06-08 MED ORDER — TECHNETIUM TC 99M SULFUR COLLOID
2.0000 | Freq: Once | INTRAVENOUS | Status: AC | PRN
  Administered 2016-06-08: 2.09 via INTRAVENOUS

## 2016-08-03 ENCOUNTER — Ambulatory Visit: Payer: Medicare Other | Attending: Internal Medicine

## 2016-08-03 DIAGNOSIS — R0683 Snoring: Secondary | ICD-10-CM | POA: Diagnosis present

## 2016-12-27 DIAGNOSIS — N401 Enlarged prostate with lower urinary tract symptoms: Secondary | ICD-10-CM | POA: Diagnosis not present

## 2016-12-27 DIAGNOSIS — R351 Nocturia: Secondary | ICD-10-CM | POA: Diagnosis not present

## 2017-03-21 DIAGNOSIS — K1370 Unspecified lesions of oral mucosa: Secondary | ICD-10-CM | POA: Diagnosis not present

## 2017-04-04 DIAGNOSIS — K1370 Unspecified lesions of oral mucosa: Secondary | ICD-10-CM | POA: Diagnosis not present

## 2017-04-11 DIAGNOSIS — K136 Irritative hyperplasia of oral mucosa: Secondary | ICD-10-CM | POA: Diagnosis not present

## 2017-04-29 DIAGNOSIS — I2510 Atherosclerotic heart disease of native coronary artery without angina pectoris: Secondary | ICD-10-CM | POA: Diagnosis not present

## 2017-04-29 DIAGNOSIS — I252 Old myocardial infarction: Secondary | ICD-10-CM | POA: Diagnosis not present

## 2017-04-29 DIAGNOSIS — I38 Endocarditis, valve unspecified: Secondary | ICD-10-CM | POA: Diagnosis not present

## 2017-04-29 DIAGNOSIS — I10 Essential (primary) hypertension: Secondary | ICD-10-CM | POA: Diagnosis not present

## 2017-06-28 DIAGNOSIS — R200 Anesthesia of skin: Secondary | ICD-10-CM | POA: Diagnosis not present

## 2017-06-28 DIAGNOSIS — E890 Postprocedural hypothyroidism: Secondary | ICD-10-CM | POA: Diagnosis not present

## 2017-06-28 DIAGNOSIS — Z85850 Personal history of malignant neoplasm of thyroid: Secondary | ICD-10-CM | POA: Diagnosis not present

## 2017-06-28 DIAGNOSIS — E668 Other obesity: Secondary | ICD-10-CM | POA: Diagnosis not present

## 2017-06-28 DIAGNOSIS — R202 Paresthesia of skin: Secondary | ICD-10-CM | POA: Diagnosis not present

## 2017-07-13 DIAGNOSIS — R351 Nocturia: Secondary | ICD-10-CM | POA: Diagnosis not present

## 2017-07-13 DIAGNOSIS — E6609 Other obesity due to excess calories: Secondary | ICD-10-CM | POA: Diagnosis not present

## 2017-07-13 DIAGNOSIS — N401 Enlarged prostate with lower urinary tract symptoms: Secondary | ICD-10-CM | POA: Diagnosis not present

## 2017-07-13 DIAGNOSIS — M5489 Other dorsalgia: Secondary | ICD-10-CM | POA: Diagnosis not present

## 2017-07-13 DIAGNOSIS — R310 Gross hematuria: Secondary | ICD-10-CM | POA: Diagnosis not present

## 2017-07-18 ENCOUNTER — Other Ambulatory Visit: Payer: Self-pay | Admitting: Urology

## 2017-07-18 DIAGNOSIS — R310 Gross hematuria: Secondary | ICD-10-CM

## 2017-07-19 DIAGNOSIS — I10 Essential (primary) hypertension: Secondary | ICD-10-CM | POA: Diagnosis not present

## 2017-07-19 DIAGNOSIS — Z955 Presence of coronary angioplasty implant and graft: Secondary | ICD-10-CM | POA: Diagnosis not present

## 2017-07-19 DIAGNOSIS — K210 Gastro-esophageal reflux disease with esophagitis: Secondary | ICD-10-CM | POA: Diagnosis not present

## 2017-07-19 DIAGNOSIS — I2510 Atherosclerotic heart disease of native coronary artery without angina pectoris: Secondary | ICD-10-CM | POA: Diagnosis not present

## 2017-07-19 DIAGNOSIS — Z85850 Personal history of malignant neoplasm of thyroid: Secondary | ICD-10-CM | POA: Diagnosis not present

## 2017-07-19 DIAGNOSIS — E782 Mixed hyperlipidemia: Secondary | ICD-10-CM | POA: Diagnosis not present

## 2017-07-19 DIAGNOSIS — R202 Paresthesia of skin: Secondary | ICD-10-CM | POA: Diagnosis not present

## 2017-07-19 DIAGNOSIS — R200 Anesthesia of skin: Secondary | ICD-10-CM | POA: Diagnosis not present

## 2017-07-22 ENCOUNTER — Ambulatory Visit
Admission: RE | Admit: 2017-07-22 | Discharge: 2017-07-22 | Disposition: A | Discharge status: Home / Self Care | Payer: PPO | Source: Ambulatory Visit | Attending: Urology | Admitting: Urology

## 2017-07-22 DIAGNOSIS — M5136 Other intervertebral disc degeneration, lumbar region: Secondary | ICD-10-CM | POA: Diagnosis not present

## 2017-07-22 DIAGNOSIS — N400 Enlarged prostate without lower urinary tract symptoms: Secondary | ICD-10-CM | POA: Diagnosis not present

## 2017-07-22 DIAGNOSIS — R310 Gross hematuria: Secondary | ICD-10-CM

## 2017-07-22 MED ORDER — IOPAMIDOL (ISOVUE-370) INJECTION 76%
125.0000 mL | Freq: Once | INTRAVENOUS | Status: AC | PRN
  Administered 2017-07-22: 125 mL via INTRAVENOUS

## 2017-07-25 DIAGNOSIS — R351 Nocturia: Secondary | ICD-10-CM | POA: Diagnosis not present

## 2017-07-25 DIAGNOSIS — N401 Enlarged prostate with lower urinary tract symptoms: Secondary | ICD-10-CM | POA: Diagnosis not present

## 2017-07-25 DIAGNOSIS — R310 Gross hematuria: Secondary | ICD-10-CM | POA: Diagnosis not present

## 2017-07-25 DIAGNOSIS — R5383 Other fatigue: Secondary | ICD-10-CM | POA: Diagnosis not present

## 2017-07-25 DIAGNOSIS — D649 Anemia, unspecified: Secondary | ICD-10-CM | POA: Diagnosis not present

## 2017-07-29 DIAGNOSIS — N400 Enlarged prostate without lower urinary tract symptoms: Secondary | ICD-10-CM | POA: Diagnosis not present

## 2017-07-29 DIAGNOSIS — Z85850 Personal history of malignant neoplasm of thyroid: Secondary | ICD-10-CM | POA: Diagnosis not present

## 2017-07-29 DIAGNOSIS — E782 Mixed hyperlipidemia: Secondary | ICD-10-CM | POA: Diagnosis not present

## 2017-07-29 DIAGNOSIS — Z0000 Encounter for general adult medical examination without abnormal findings: Secondary | ICD-10-CM | POA: Diagnosis not present

## 2017-07-29 DIAGNOSIS — I2510 Atherosclerotic heart disease of native coronary artery without angina pectoris: Secondary | ICD-10-CM | POA: Diagnosis not present

## 2017-07-29 DIAGNOSIS — Z955 Presence of coronary angioplasty implant and graft: Secondary | ICD-10-CM | POA: Diagnosis not present

## 2017-07-29 DIAGNOSIS — M25462 Effusion, left knee: Secondary | ICD-10-CM | POA: Diagnosis not present

## 2017-07-29 DIAGNOSIS — M1990 Unspecified osteoarthritis, unspecified site: Secondary | ICD-10-CM | POA: Diagnosis not present

## 2017-07-29 DIAGNOSIS — M25552 Pain in left hip: Secondary | ICD-10-CM | POA: Diagnosis not present

## 2017-07-29 DIAGNOSIS — I10 Essential (primary) hypertension: Secondary | ICD-10-CM | POA: Diagnosis not present

## 2017-08-02 DIAGNOSIS — N401 Enlarged prostate with lower urinary tract symptoms: Secondary | ICD-10-CM | POA: Diagnosis not present

## 2017-08-11 DIAGNOSIS — R3914 Feeling of incomplete bladder emptying: Secondary | ICD-10-CM | POA: Diagnosis not present

## 2017-08-11 DIAGNOSIS — R310 Gross hematuria: Secondary | ICD-10-CM | POA: Diagnosis not present

## 2017-08-11 DIAGNOSIS — N401 Enlarged prostate with lower urinary tract symptoms: Secondary | ICD-10-CM | POA: Diagnosis not present

## 2017-08-22 DIAGNOSIS — H35721 Serous detachment of retinal pigment epithelium, right eye: Secondary | ICD-10-CM | POA: Diagnosis not present

## 2017-08-22 DIAGNOSIS — H04123 Dry eye syndrome of bilateral lacrimal glands: Secondary | ICD-10-CM | POA: Diagnosis not present

## 2017-08-22 DIAGNOSIS — H0289 Other specified disorders of eyelid: Secondary | ICD-10-CM | POA: Diagnosis not present

## 2017-08-22 DIAGNOSIS — Z8669 Personal history of other diseases of the nervous system and sense organs: Secondary | ICD-10-CM | POA: Diagnosis not present

## 2017-08-22 DIAGNOSIS — H43822 Vitreomacular adhesion, left eye: Secondary | ICD-10-CM | POA: Diagnosis not present

## 2017-08-24 DIAGNOSIS — N401 Enlarged prostate with lower urinary tract symptoms: Secondary | ICD-10-CM | POA: Diagnosis not present

## 2017-08-31 DIAGNOSIS — N401 Enlarged prostate with lower urinary tract symptoms: Secondary | ICD-10-CM | POA: Diagnosis not present

## 2017-08-31 DIAGNOSIS — R102 Pelvic and perineal pain: Secondary | ICD-10-CM | POA: Diagnosis not present

## 2017-09-21 DIAGNOSIS — N401 Enlarged prostate with lower urinary tract symptoms: Secondary | ICD-10-CM | POA: Diagnosis not present

## 2017-10-18 DIAGNOSIS — E782 Mixed hyperlipidemia: Secondary | ICD-10-CM | POA: Diagnosis not present

## 2017-10-25 DIAGNOSIS — E669 Obesity, unspecified: Secondary | ICD-10-CM | POA: Diagnosis not present

## 2017-10-25 DIAGNOSIS — E890 Postprocedural hypothyroidism: Secondary | ICD-10-CM | POA: Diagnosis not present

## 2017-10-25 DIAGNOSIS — Z85850 Personal history of malignant neoplasm of thyroid: Secondary | ICD-10-CM | POA: Diagnosis not present

## 2017-12-16 DIAGNOSIS — I10 Essential (primary) hypertension: Secondary | ICD-10-CM | POA: Diagnosis not present

## 2017-12-16 DIAGNOSIS — I25118 Atherosclerotic heart disease of native coronary artery with other forms of angina pectoris: Secondary | ICD-10-CM | POA: Diagnosis not present

## 2017-12-16 DIAGNOSIS — E782 Mixed hyperlipidemia: Secondary | ICD-10-CM | POA: Diagnosis not present

## 2017-12-16 DIAGNOSIS — I119 Hypertensive heart disease without heart failure: Secondary | ICD-10-CM | POA: Diagnosis not present

## 2017-12-20 DIAGNOSIS — N401 Enlarged prostate with lower urinary tract symptoms: Secondary | ICD-10-CM | POA: Diagnosis not present

## 2017-12-28 DIAGNOSIS — N401 Enlarged prostate with lower urinary tract symptoms: Secondary | ICD-10-CM | POA: Diagnosis not present

## 2018-01-10 DIAGNOSIS — S29012A Strain of muscle and tendon of back wall of thorax, initial encounter: Secondary | ICD-10-CM | POA: Diagnosis not present

## 2018-01-10 DIAGNOSIS — M546 Pain in thoracic spine: Secondary | ICD-10-CM | POA: Diagnosis not present

## 2018-01-23 DIAGNOSIS — M6281 Muscle weakness (generalized): Secondary | ICD-10-CM | POA: Diagnosis not present

## 2018-01-23 DIAGNOSIS — M2560 Stiffness of unspecified joint, not elsewhere classified: Secondary | ICD-10-CM | POA: Diagnosis not present

## 2018-01-23 DIAGNOSIS — M898X1 Other specified disorders of bone, shoulder: Secondary | ICD-10-CM | POA: Diagnosis not present

## 2018-01-24 DIAGNOSIS — N400 Enlarged prostate without lower urinary tract symptoms: Secondary | ICD-10-CM | POA: Diagnosis not present

## 2018-01-24 DIAGNOSIS — M1990 Unspecified osteoarthritis, unspecified site: Secondary | ICD-10-CM | POA: Diagnosis not present

## 2018-01-24 DIAGNOSIS — M25552 Pain in left hip: Secondary | ICD-10-CM | POA: Diagnosis not present

## 2018-01-24 DIAGNOSIS — I2510 Atherosclerotic heart disease of native coronary artery without angina pectoris: Secondary | ICD-10-CM | POA: Diagnosis not present

## 2018-01-24 DIAGNOSIS — M25462 Effusion, left knee: Secondary | ICD-10-CM | POA: Diagnosis not present

## 2018-01-24 DIAGNOSIS — Z955 Presence of coronary angioplasty implant and graft: Secondary | ICD-10-CM | POA: Diagnosis not present

## 2018-01-24 DIAGNOSIS — Z85850 Personal history of malignant neoplasm of thyroid: Secondary | ICD-10-CM | POA: Diagnosis not present

## 2018-01-24 DIAGNOSIS — E782 Mixed hyperlipidemia: Secondary | ICD-10-CM | POA: Diagnosis not present

## 2018-01-31 DIAGNOSIS — Z85850 Personal history of malignant neoplasm of thyroid: Secondary | ICD-10-CM | POA: Diagnosis not present

## 2018-01-31 DIAGNOSIS — M1990 Unspecified osteoarthritis, unspecified site: Secondary | ICD-10-CM | POA: Diagnosis not present

## 2018-01-31 DIAGNOSIS — I10 Essential (primary) hypertension: Secondary | ICD-10-CM | POA: Diagnosis not present

## 2018-01-31 DIAGNOSIS — K210 Gastro-esophageal reflux disease with esophagitis: Secondary | ICD-10-CM | POA: Diagnosis not present

## 2018-01-31 DIAGNOSIS — I25118 Atherosclerotic heart disease of native coronary artery with other forms of angina pectoris: Secondary | ICD-10-CM | POA: Diagnosis not present

## 2018-01-31 DIAGNOSIS — I38 Endocarditis, valve unspecified: Secondary | ICD-10-CM | POA: Diagnosis not present

## 2018-02-01 DIAGNOSIS — M898X1 Other specified disorders of bone, shoulder: Secondary | ICD-10-CM | POA: Diagnosis not present

## 2018-02-20 DIAGNOSIS — H35721 Serous detachment of retinal pigment epithelium, right eye: Secondary | ICD-10-CM | POA: Diagnosis not present

## 2018-02-20 DIAGNOSIS — H25813 Combined forms of age-related cataract, bilateral: Secondary | ICD-10-CM | POA: Diagnosis not present

## 2018-02-20 DIAGNOSIS — H43822 Vitreomacular adhesion, left eye: Secondary | ICD-10-CM | POA: Diagnosis not present

## 2018-02-20 DIAGNOSIS — H04123 Dry eye syndrome of bilateral lacrimal glands: Secondary | ICD-10-CM | POA: Diagnosis not present

## 2018-02-28 DIAGNOSIS — H16223 Keratoconjunctivitis sicca, not specified as Sjogren's, bilateral: Secondary | ICD-10-CM | POA: Diagnosis not present

## 2018-05-18 DIAGNOSIS — M1990 Unspecified osteoarthritis, unspecified site: Secondary | ICD-10-CM | POA: Diagnosis not present

## 2018-05-18 DIAGNOSIS — K210 Gastro-esophageal reflux disease with esophagitis: Secondary | ICD-10-CM | POA: Diagnosis not present

## 2018-05-18 DIAGNOSIS — I38 Endocarditis, valve unspecified: Secondary | ICD-10-CM | POA: Diagnosis not present

## 2018-05-18 DIAGNOSIS — I10 Essential (primary) hypertension: Secondary | ICD-10-CM | POA: Diagnosis not present

## 2018-05-18 DIAGNOSIS — I25118 Atherosclerotic heart disease of native coronary artery with other forms of angina pectoris: Secondary | ICD-10-CM | POA: Diagnosis not present

## 2018-05-24 DIAGNOSIS — K210 Gastro-esophageal reflux disease with esophagitis: Secondary | ICD-10-CM | POA: Diagnosis not present

## 2018-05-24 DIAGNOSIS — R0981 Nasal congestion: Secondary | ICD-10-CM | POA: Diagnosis not present

## 2018-05-24 DIAGNOSIS — Z0000 Encounter for general adult medical examination without abnormal findings: Secondary | ICD-10-CM | POA: Diagnosis not present

## 2018-05-24 DIAGNOSIS — I25118 Atherosclerotic heart disease of native coronary artery with other forms of angina pectoris: Secondary | ICD-10-CM | POA: Diagnosis not present

## 2018-05-24 DIAGNOSIS — I10 Essential (primary) hypertension: Secondary | ICD-10-CM | POA: Diagnosis not present

## 2018-05-24 DIAGNOSIS — Z8739 Personal history of other diseases of the musculoskeletal system and connective tissue: Secondary | ICD-10-CM | POA: Diagnosis not present

## 2018-05-24 DIAGNOSIS — I38 Endocarditis, valve unspecified: Secondary | ICD-10-CM | POA: Diagnosis not present

## 2018-05-26 DIAGNOSIS — J301 Allergic rhinitis due to pollen: Secondary | ICD-10-CM | POA: Diagnosis not present

## 2018-05-30 DIAGNOSIS — J301 Allergic rhinitis due to pollen: Secondary | ICD-10-CM | POA: Diagnosis not present

## 2018-06-21 DIAGNOSIS — E782 Mixed hyperlipidemia: Secondary | ICD-10-CM | POA: Diagnosis not present

## 2018-06-21 DIAGNOSIS — I25118 Atherosclerotic heart disease of native coronary artery with other forms of angina pectoris: Secondary | ICD-10-CM | POA: Diagnosis not present

## 2018-06-21 DIAGNOSIS — I10 Essential (primary) hypertension: Secondary | ICD-10-CM | POA: Diagnosis not present

## 2018-07-24 DIAGNOSIS — N401 Enlarged prostate with lower urinary tract symptoms: Secondary | ICD-10-CM | POA: Diagnosis not present

## 2018-07-24 DIAGNOSIS — R351 Nocturia: Secondary | ICD-10-CM | POA: Diagnosis not present

## 2018-07-26 DIAGNOSIS — N401 Enlarged prostate with lower urinary tract symptoms: Secondary | ICD-10-CM | POA: Diagnosis not present

## 2018-07-26 DIAGNOSIS — R972 Elevated prostate specific antigen [PSA]: Secondary | ICD-10-CM | POA: Diagnosis not present

## 2018-08-25 DIAGNOSIS — N401 Enlarged prostate with lower urinary tract symptoms: Secondary | ICD-10-CM | POA: Diagnosis not present

## 2018-08-25 DIAGNOSIS — R972 Elevated prostate specific antigen [PSA]: Secondary | ICD-10-CM | POA: Diagnosis not present

## 2018-09-08 DIAGNOSIS — R972 Elevated prostate specific antigen [PSA]: Secondary | ICD-10-CM | POA: Diagnosis not present

## 2018-09-08 DIAGNOSIS — N401 Enlarged prostate with lower urinary tract symptoms: Secondary | ICD-10-CM | POA: Diagnosis not present

## 2018-09-13 DIAGNOSIS — H903 Sensorineural hearing loss, bilateral: Secondary | ICD-10-CM | POA: Diagnosis not present

## 2018-09-20 DIAGNOSIS — K210 Gastro-esophageal reflux disease with esophagitis: Secondary | ICD-10-CM | POA: Diagnosis not present

## 2018-09-20 DIAGNOSIS — R0981 Nasal congestion: Secondary | ICD-10-CM | POA: Diagnosis not present

## 2018-09-20 DIAGNOSIS — I10 Essential (primary) hypertension: Secondary | ICD-10-CM | POA: Diagnosis not present

## 2018-09-20 DIAGNOSIS — I38 Endocarditis, valve unspecified: Secondary | ICD-10-CM | POA: Diagnosis not present

## 2018-09-20 DIAGNOSIS — Z8739 Personal history of other diseases of the musculoskeletal system and connective tissue: Secondary | ICD-10-CM | POA: Diagnosis not present

## 2018-09-20 DIAGNOSIS — I25118 Atherosclerotic heart disease of native coronary artery with other forms of angina pectoris: Secondary | ICD-10-CM | POA: Diagnosis not present

## 2018-09-22 DIAGNOSIS — M546 Pain in thoracic spine: Secondary | ICD-10-CM | POA: Diagnosis not present

## 2018-09-22 DIAGNOSIS — M898X1 Other specified disorders of bone, shoulder: Secondary | ICD-10-CM | POA: Diagnosis not present

## 2018-09-27 ENCOUNTER — Other Ambulatory Visit: Payer: Self-pay | Admitting: Family Medicine

## 2018-09-27 DIAGNOSIS — R0781 Pleurodynia: Secondary | ICD-10-CM | POA: Diagnosis not present

## 2018-09-27 DIAGNOSIS — E782 Mixed hyperlipidemia: Secondary | ICD-10-CM | POA: Diagnosis not present

## 2018-09-27 DIAGNOSIS — M546 Pain in thoracic spine: Secondary | ICD-10-CM

## 2018-09-27 DIAGNOSIS — M1990 Unspecified osteoarthritis, unspecified site: Secondary | ICD-10-CM | POA: Diagnosis not present

## 2018-09-27 DIAGNOSIS — R0789 Other chest pain: Secondary | ICD-10-CM | POA: Diagnosis not present

## 2018-09-27 DIAGNOSIS — I25118 Atherosclerotic heart disease of native coronary artery with other forms of angina pectoris: Secondary | ICD-10-CM | POA: Diagnosis not present

## 2018-09-27 DIAGNOSIS — I10 Essential (primary) hypertension: Secondary | ICD-10-CM | POA: Diagnosis not present

## 2018-09-27 DIAGNOSIS — K210 Gastro-esophageal reflux disease with esophagitis: Secondary | ICD-10-CM | POA: Diagnosis not present

## 2018-09-27 DIAGNOSIS — E786 Lipoprotein deficiency: Secondary | ICD-10-CM | POA: Diagnosis not present

## 2018-09-27 DIAGNOSIS — M898X1 Other specified disorders of bone, shoulder: Secondary | ICD-10-CM

## 2018-09-27 DIAGNOSIS — Z0000 Encounter for general adult medical examination without abnormal findings: Secondary | ICD-10-CM | POA: Diagnosis not present

## 2018-09-27 DIAGNOSIS — S299XXA Unspecified injury of thorax, initial encounter: Secondary | ICD-10-CM | POA: Diagnosis not present

## 2018-10-03 ENCOUNTER — Ambulatory Visit
Admission: RE | Admit: 2018-10-03 | Discharge: 2018-10-03 | Disposition: A | Discharge status: Home / Self Care | Payer: PPO | Source: Ambulatory Visit | Attending: Family Medicine | Admitting: Family Medicine

## 2018-10-03 DIAGNOSIS — M546 Pain in thoracic spine: Secondary | ICD-10-CM

## 2018-10-03 DIAGNOSIS — M4324 Fusion of spine, thoracic region: Secondary | ICD-10-CM | POA: Diagnosis not present

## 2018-10-03 DIAGNOSIS — M898X1 Other specified disorders of bone, shoulder: Secondary | ICD-10-CM | POA: Diagnosis not present

## 2018-10-03 MED ORDER — GADOBUTROL 1 MMOL/ML IV SOLN
10.0000 mL | Freq: Once | INTRAVENOUS | Status: AC | PRN
  Administered 2018-10-03: 10 mL via INTRAVENOUS

## 2018-11-07 DIAGNOSIS — E890 Postprocedural hypothyroidism: Secondary | ICD-10-CM | POA: Diagnosis not present

## 2018-11-15 DIAGNOSIS — M109 Gout, unspecified: Secondary | ICD-10-CM | POA: Diagnosis not present

## 2018-11-15 DIAGNOSIS — E890 Postprocedural hypothyroidism: Secondary | ICD-10-CM | POA: Diagnosis not present

## 2018-11-15 DIAGNOSIS — Z85850 Personal history of malignant neoplasm of thyroid: Secondary | ICD-10-CM | POA: Diagnosis not present

## 2018-12-04 DIAGNOSIS — M109 Gout, unspecified: Secondary | ICD-10-CM | POA: Diagnosis not present

## 2019-01-24 DIAGNOSIS — M1990 Unspecified osteoarthritis, unspecified site: Secondary | ICD-10-CM | POA: Diagnosis not present

## 2019-01-24 DIAGNOSIS — R0789 Other chest pain: Secondary | ICD-10-CM | POA: Diagnosis not present

## 2019-01-24 DIAGNOSIS — E782 Mixed hyperlipidemia: Secondary | ICD-10-CM | POA: Diagnosis not present

## 2019-01-24 DIAGNOSIS — Z0000 Encounter for general adult medical examination without abnormal findings: Secondary | ICD-10-CM | POA: Diagnosis not present

## 2019-01-24 DIAGNOSIS — I25118 Atherosclerotic heart disease of native coronary artery with other forms of angina pectoris: Secondary | ICD-10-CM | POA: Diagnosis not present

## 2019-01-31 DIAGNOSIS — I25118 Atherosclerotic heart disease of native coronary artery with other forms of angina pectoris: Secondary | ICD-10-CM | POA: Diagnosis not present

## 2019-01-31 DIAGNOSIS — K210 Gastro-esophageal reflux disease with esophagitis: Secondary | ICD-10-CM | POA: Diagnosis not present

## 2019-01-31 DIAGNOSIS — Z0000 Encounter for general adult medical examination without abnormal findings: Secondary | ICD-10-CM | POA: Diagnosis not present

## 2019-01-31 DIAGNOSIS — Z955 Presence of coronary angioplasty implant and graft: Secondary | ICD-10-CM | POA: Diagnosis not present

## 2019-01-31 DIAGNOSIS — Z8739 Personal history of other diseases of the musculoskeletal system and connective tissue: Secondary | ICD-10-CM | POA: Diagnosis not present

## 2019-01-31 DIAGNOSIS — I10 Essential (primary) hypertension: Secondary | ICD-10-CM | POA: Diagnosis not present

## 2019-01-31 DIAGNOSIS — E782 Mixed hyperlipidemia: Secondary | ICD-10-CM | POA: Diagnosis not present

## 2019-02-23 DIAGNOSIS — R6889 Other general symptoms and signs: Secondary | ICD-10-CM | POA: Diagnosis not present

## 2019-02-23 DIAGNOSIS — J111 Influenza due to unidentified influenza virus with other respiratory manifestations: Secondary | ICD-10-CM | POA: Diagnosis not present

## 2019-03-26 DIAGNOSIS — N401 Enlarged prostate with lower urinary tract symptoms: Secondary | ICD-10-CM | POA: Diagnosis not present

## 2019-03-26 DIAGNOSIS — R972 Elevated prostate specific antigen [PSA]: Secondary | ICD-10-CM | POA: Diagnosis not present

## 2019-04-04 DIAGNOSIS — M25562 Pain in left knee: Secondary | ICD-10-CM | POA: Diagnosis not present

## 2019-04-04 DIAGNOSIS — M170 Bilateral primary osteoarthritis of knee: Secondary | ICD-10-CM | POA: Diagnosis not present

## 2019-04-04 DIAGNOSIS — M25561 Pain in right knee: Secondary | ICD-10-CM | POA: Diagnosis not present

## 2019-05-09 DIAGNOSIS — I252 Old myocardial infarction: Secondary | ICD-10-CM | POA: Diagnosis not present

## 2019-05-09 DIAGNOSIS — I2510 Atherosclerotic heart disease of native coronary artery without angina pectoris: Secondary | ICD-10-CM | POA: Diagnosis not present

## 2019-05-09 DIAGNOSIS — I10 Essential (primary) hypertension: Secondary | ICD-10-CM | POA: Diagnosis not present

## 2019-05-09 DIAGNOSIS — E782 Mixed hyperlipidemia: Secondary | ICD-10-CM | POA: Diagnosis not present

## 2019-05-23 DIAGNOSIS — I119 Hypertensive heart disease without heart failure: Secondary | ICD-10-CM | POA: Diagnosis not present

## 2019-05-23 DIAGNOSIS — E782 Mixed hyperlipidemia: Secondary | ICD-10-CM | POA: Diagnosis not present

## 2019-05-23 DIAGNOSIS — I10 Essential (primary) hypertension: Secondary | ICD-10-CM | POA: Diagnosis not present

## 2019-05-23 DIAGNOSIS — I252 Old myocardial infarction: Secondary | ICD-10-CM | POA: Diagnosis not present

## 2019-05-23 DIAGNOSIS — I2510 Atherosclerotic heart disease of native coronary artery without angina pectoris: Secondary | ICD-10-CM | POA: Diagnosis not present

## 2019-05-28 DIAGNOSIS — I10 Essential (primary) hypertension: Secondary | ICD-10-CM | POA: Diagnosis not present

## 2019-05-28 DIAGNOSIS — K210 Gastro-esophageal reflux disease with esophagitis: Secondary | ICD-10-CM | POA: Diagnosis not present

## 2019-05-28 DIAGNOSIS — I25118 Atherosclerotic heart disease of native coronary artery with other forms of angina pectoris: Secondary | ICD-10-CM | POA: Diagnosis not present

## 2019-05-28 DIAGNOSIS — Z955 Presence of coronary angioplasty implant and graft: Secondary | ICD-10-CM | POA: Diagnosis not present

## 2019-05-28 DIAGNOSIS — E782 Mixed hyperlipidemia: Secondary | ICD-10-CM | POA: Diagnosis not present

## 2019-05-28 DIAGNOSIS — Z8739 Personal history of other diseases of the musculoskeletal system and connective tissue: Secondary | ICD-10-CM | POA: Diagnosis not present

## 2019-06-04 DIAGNOSIS — E782 Mixed hyperlipidemia: Secondary | ICD-10-CM | POA: Diagnosis not present

## 2019-06-04 DIAGNOSIS — Z6833 Body mass index (BMI) 33.0-33.9, adult: Secondary | ICD-10-CM | POA: Diagnosis not present

## 2019-06-04 DIAGNOSIS — I2510 Atherosclerotic heart disease of native coronary artery without angina pectoris: Secondary | ICD-10-CM | POA: Diagnosis not present

## 2019-06-04 DIAGNOSIS — I10 Essential (primary) hypertension: Secondary | ICD-10-CM | POA: Diagnosis not present

## 2019-06-04 DIAGNOSIS — N400 Enlarged prostate without lower urinary tract symptoms: Secondary | ICD-10-CM | POA: Diagnosis not present

## 2019-08-14 DIAGNOSIS — R399 Unspecified symptoms and signs involving the genitourinary system: Secondary | ICD-10-CM | POA: Diagnosis not present

## 2019-08-14 DIAGNOSIS — I10 Essential (primary) hypertension: Secondary | ICD-10-CM | POA: Diagnosis not present

## 2019-08-14 DIAGNOSIS — R8290 Unspecified abnormal findings in urine: Secondary | ICD-10-CM | POA: Diagnosis not present

## 2019-08-14 DIAGNOSIS — R739 Hyperglycemia, unspecified: Secondary | ICD-10-CM | POA: Diagnosis not present

## 2019-08-28 DIAGNOSIS — R350 Frequency of micturition: Secondary | ICD-10-CM | POA: Diagnosis not present

## 2019-08-28 DIAGNOSIS — R351 Nocturia: Secondary | ICD-10-CM | POA: Diagnosis not present

## 2019-08-28 DIAGNOSIS — R972 Elevated prostate specific antigen [PSA]: Secondary | ICD-10-CM | POA: Diagnosis not present

## 2019-08-28 DIAGNOSIS — N401 Enlarged prostate with lower urinary tract symptoms: Secondary | ICD-10-CM | POA: Diagnosis not present

## 2019-08-29 DIAGNOSIS — N401 Enlarged prostate with lower urinary tract symptoms: Secondary | ICD-10-CM | POA: Diagnosis not present

## 2019-08-29 DIAGNOSIS — R972 Elevated prostate specific antigen [PSA]: Secondary | ICD-10-CM | POA: Diagnosis not present

## 2019-08-29 DIAGNOSIS — R3915 Urgency of urination: Secondary | ICD-10-CM | POA: Diagnosis not present

## 2019-08-29 DIAGNOSIS — R351 Nocturia: Secondary | ICD-10-CM | POA: Diagnosis not present

## 2019-08-29 DIAGNOSIS — R350 Frequency of micturition: Secondary | ICD-10-CM | POA: Diagnosis not present

## 2019-09-03 ENCOUNTER — Other Ambulatory Visit: Payer: Self-pay | Admitting: Urology

## 2019-09-03 ENCOUNTER — Other Ambulatory Visit (HOSPITAL_COMMUNITY): Payer: Self-pay | Admitting: Urology

## 2019-09-03 DIAGNOSIS — R972 Elevated prostate specific antigen [PSA]: Secondary | ICD-10-CM

## 2019-09-06 DIAGNOSIS — N401 Enlarged prostate with lower urinary tract symptoms: Secondary | ICD-10-CM | POA: Diagnosis not present

## 2019-09-06 DIAGNOSIS — R972 Elevated prostate specific antigen [PSA]: Secondary | ICD-10-CM | POA: Diagnosis not present

## 2019-09-13 ENCOUNTER — Other Ambulatory Visit: Payer: Self-pay

## 2019-09-13 ENCOUNTER — Ambulatory Visit
Admission: RE | Admit: 2019-09-13 | Discharge: 2019-09-13 | Disposition: A | Discharge status: Home / Self Care | Payer: PPO | Source: Ambulatory Visit | Attending: Urology | Admitting: Urology

## 2019-09-13 DIAGNOSIS — R972 Elevated prostate specific antigen [PSA]: Secondary | ICD-10-CM | POA: Diagnosis not present

## 2019-09-13 MED ORDER — GADOBUTROL 1 MMOL/ML IV SOLN
10.0000 mL | Freq: Once | INTRAVENOUS | Status: AC | PRN
  Administered 2019-09-13: 10 mL via INTRAVENOUS

## 2019-09-17 DIAGNOSIS — N401 Enlarged prostate with lower urinary tract symptoms: Secondary | ICD-10-CM | POA: Diagnosis not present

## 2019-09-17 DIAGNOSIS — R972 Elevated prostate specific antigen [PSA]: Secondary | ICD-10-CM | POA: Diagnosis not present

## 2019-10-01 DIAGNOSIS — Z6833 Body mass index (BMI) 33.0-33.9, adult: Secondary | ICD-10-CM | POA: Diagnosis not present

## 2019-10-01 DIAGNOSIS — E782 Mixed hyperlipidemia: Secondary | ICD-10-CM | POA: Diagnosis not present

## 2019-10-01 DIAGNOSIS — N400 Enlarged prostate without lower urinary tract symptoms: Secondary | ICD-10-CM | POA: Diagnosis not present

## 2019-10-01 DIAGNOSIS — I2510 Atherosclerotic heart disease of native coronary artery without angina pectoris: Secondary | ICD-10-CM | POA: Diagnosis not present

## 2019-10-01 DIAGNOSIS — I10 Essential (primary) hypertension: Secondary | ICD-10-CM | POA: Diagnosis not present

## 2019-10-03 DIAGNOSIS — Z0000 Encounter for general adult medical examination without abnormal findings: Secondary | ICD-10-CM | POA: Diagnosis not present

## 2019-10-03 DIAGNOSIS — Z8739 Personal history of other diseases of the musculoskeletal system and connective tissue: Secondary | ICD-10-CM | POA: Diagnosis not present

## 2019-10-03 DIAGNOSIS — I10 Essential (primary) hypertension: Secondary | ICD-10-CM | POA: Diagnosis not present

## 2019-10-03 DIAGNOSIS — Z800 Family history of malignant neoplasm of digestive organs: Secondary | ICD-10-CM | POA: Diagnosis not present

## 2019-10-03 DIAGNOSIS — N182 Chronic kidney disease, stage 2 (mild): Secondary | ICD-10-CM | POA: Diagnosis not present

## 2019-10-03 DIAGNOSIS — M1712 Unilateral primary osteoarthritis, left knee: Secondary | ICD-10-CM | POA: Diagnosis not present

## 2019-10-03 DIAGNOSIS — Z6833 Body mass index (BMI) 33.0-33.9, adult: Secondary | ICD-10-CM | POA: Diagnosis not present

## 2019-10-03 DIAGNOSIS — R7989 Other specified abnormal findings of blood chemistry: Secondary | ICD-10-CM | POA: Diagnosis not present

## 2019-10-03 DIAGNOSIS — N400 Enlarged prostate without lower urinary tract symptoms: Secondary | ICD-10-CM | POA: Diagnosis not present

## 2019-10-03 DIAGNOSIS — I38 Endocarditis, valve unspecified: Secondary | ICD-10-CM | POA: Diagnosis not present

## 2019-10-03 DIAGNOSIS — D126 Benign neoplasm of colon, unspecified: Secondary | ICD-10-CM | POA: Diagnosis not present

## 2019-10-03 DIAGNOSIS — I2510 Atherosclerotic heart disease of native coronary artery without angina pectoris: Secondary | ICD-10-CM | POA: Diagnosis not present

## 2019-10-29 DIAGNOSIS — Z86010 Personal history of colonic polyps: Secondary | ICD-10-CM | POA: Diagnosis not present

## 2019-10-29 DIAGNOSIS — Z800 Family history of malignant neoplasm of digestive organs: Secondary | ICD-10-CM | POA: Diagnosis not present

## 2019-10-29 DIAGNOSIS — Z955 Presence of coronary angioplasty implant and graft: Secondary | ICD-10-CM | POA: Diagnosis not present

## 2019-10-29 DIAGNOSIS — K2100 Gastro-esophageal reflux disease with esophagitis, without bleeding: Secondary | ICD-10-CM | POA: Diagnosis not present

## 2019-10-29 DIAGNOSIS — K5730 Diverticulosis of large intestine without perforation or abscess without bleeding: Secondary | ICD-10-CM | POA: Diagnosis not present

## 2019-10-29 DIAGNOSIS — K5909 Other constipation: Secondary | ICD-10-CM | POA: Diagnosis not present

## 2019-11-14 DIAGNOSIS — Z85850 Personal history of malignant neoplasm of thyroid: Secondary | ICD-10-CM | POA: Diagnosis not present

## 2019-11-14 DIAGNOSIS — E890 Postprocedural hypothyroidism: Secondary | ICD-10-CM | POA: Diagnosis not present

## 2019-11-21 DIAGNOSIS — Z85850 Personal history of malignant neoplasm of thyroid: Secondary | ICD-10-CM | POA: Diagnosis not present

## 2019-11-21 DIAGNOSIS — E890 Postprocedural hypothyroidism: Secondary | ICD-10-CM | POA: Diagnosis not present

## 2019-11-26 DIAGNOSIS — E782 Mixed hyperlipidemia: Secondary | ICD-10-CM | POA: Diagnosis not present

## 2019-11-26 DIAGNOSIS — I2510 Atherosclerotic heart disease of native coronary artery without angina pectoris: Secondary | ICD-10-CM | POA: Diagnosis not present

## 2019-11-26 DIAGNOSIS — I10 Essential (primary) hypertension: Secondary | ICD-10-CM | POA: Diagnosis not present

## 2019-11-26 DIAGNOSIS — I119 Hypertensive heart disease without heart failure: Secondary | ICD-10-CM | POA: Diagnosis not present

## 2019-12-20 DIAGNOSIS — R202 Paresthesia of skin: Secondary | ICD-10-CM | POA: Diagnosis not present

## 2019-12-20 DIAGNOSIS — M25511 Pain in right shoulder: Secondary | ICD-10-CM | POA: Diagnosis not present

## 2019-12-20 DIAGNOSIS — M25512 Pain in left shoulder: Secondary | ICD-10-CM | POA: Diagnosis not present

## 2019-12-20 DIAGNOSIS — M791 Myalgia, unspecified site: Secondary | ICD-10-CM | POA: Diagnosis not present

## 2020-01-15 DIAGNOSIS — H16223 Keratoconjunctivitis sicca, not specified as Sjogren's, bilateral: Secondary | ICD-10-CM | POA: Diagnosis not present

## 2020-01-30 DIAGNOSIS — N182 Chronic kidney disease, stage 2 (mild): Secondary | ICD-10-CM | POA: Diagnosis not present

## 2020-01-30 DIAGNOSIS — N400 Enlarged prostate without lower urinary tract symptoms: Secondary | ICD-10-CM | POA: Diagnosis not present

## 2020-01-30 DIAGNOSIS — D126 Benign neoplasm of colon, unspecified: Secondary | ICD-10-CM | POA: Diagnosis not present

## 2020-01-30 DIAGNOSIS — Z0000 Encounter for general adult medical examination without abnormal findings: Secondary | ICD-10-CM | POA: Diagnosis not present

## 2020-01-30 DIAGNOSIS — Z800 Family history of malignant neoplasm of digestive organs: Secondary | ICD-10-CM | POA: Diagnosis not present

## 2020-01-30 DIAGNOSIS — M1712 Unilateral primary osteoarthritis, left knee: Secondary | ICD-10-CM | POA: Diagnosis not present

## 2020-01-30 DIAGNOSIS — Z6833 Body mass index (BMI) 33.0-33.9, adult: Secondary | ICD-10-CM | POA: Diagnosis not present

## 2020-01-30 DIAGNOSIS — I38 Endocarditis, valve unspecified: Secondary | ICD-10-CM | POA: Diagnosis not present

## 2020-01-30 DIAGNOSIS — R7309 Other abnormal glucose: Secondary | ICD-10-CM | POA: Diagnosis not present

## 2020-01-30 DIAGNOSIS — E782 Mixed hyperlipidemia: Secondary | ICD-10-CM | POA: Diagnosis not present

## 2020-01-30 DIAGNOSIS — Z8739 Personal history of other diseases of the musculoskeletal system and connective tissue: Secondary | ICD-10-CM | POA: Diagnosis not present

## 2020-02-05 DIAGNOSIS — I38 Endocarditis, valve unspecified: Secondary | ICD-10-CM | POA: Diagnosis not present

## 2020-02-05 DIAGNOSIS — K2100 Gastro-esophageal reflux disease with esophagitis, without bleeding: Secondary | ICD-10-CM | POA: Diagnosis not present

## 2020-02-05 DIAGNOSIS — Z955 Presence of coronary angioplasty implant and graft: Secondary | ICD-10-CM | POA: Diagnosis not present

## 2020-02-05 DIAGNOSIS — M48061 Spinal stenosis, lumbar region without neurogenic claudication: Secondary | ICD-10-CM | POA: Diagnosis not present

## 2020-02-05 DIAGNOSIS — Z6832 Body mass index (BMI) 32.0-32.9, adult: Secondary | ICD-10-CM | POA: Diagnosis not present

## 2020-02-05 DIAGNOSIS — M1612 Unilateral primary osteoarthritis, left hip: Secondary | ICD-10-CM | POA: Diagnosis not present

## 2020-02-05 DIAGNOSIS — I10 Essential (primary) hypertension: Secondary | ICD-10-CM | POA: Diagnosis not present

## 2020-02-05 DIAGNOSIS — M25552 Pain in left hip: Secondary | ICD-10-CM | POA: Diagnosis not present

## 2020-02-05 DIAGNOSIS — I2510 Atherosclerotic heart disease of native coronary artery without angina pectoris: Secondary | ICD-10-CM | POA: Diagnosis not present

## 2020-02-05 DIAGNOSIS — Z0000 Encounter for general adult medical examination without abnormal findings: Secondary | ICD-10-CM | POA: Diagnosis not present

## 2020-02-05 DIAGNOSIS — M1990 Unspecified osteoarthritis, unspecified site: Secondary | ICD-10-CM | POA: Diagnosis not present

## 2020-02-18 ENCOUNTER — Other Ambulatory Visit: Payer: Self-pay | Admitting: Orthopedic Surgery

## 2020-02-18 DIAGNOSIS — M9933 Osseous stenosis of neural canal of lumbar region: Secondary | ICD-10-CM | POA: Diagnosis not present

## 2020-02-18 DIAGNOSIS — M4807 Spinal stenosis, lumbosacral region: Secondary | ICD-10-CM

## 2020-02-19 ENCOUNTER — Other Ambulatory Visit: Payer: Self-pay | Admitting: Orthopedic Surgery

## 2020-02-19 DIAGNOSIS — M4807 Spinal stenosis, lumbosacral region: Secondary | ICD-10-CM

## 2020-02-19 DIAGNOSIS — M9933 Osseous stenosis of neural canal of lumbar region: Secondary | ICD-10-CM

## 2020-02-20 ENCOUNTER — Other Ambulatory Visit: Payer: Self-pay | Admitting: Orthopedic Surgery

## 2020-02-20 DIAGNOSIS — M9933 Osseous stenosis of neural canal of lumbar region: Secondary | ICD-10-CM

## 2020-02-20 DIAGNOSIS — M4807 Spinal stenosis, lumbosacral region: Secondary | ICD-10-CM

## 2020-03-17 ENCOUNTER — Ambulatory Visit
Admission: RE | Admit: 2020-03-17 | Discharge: 2020-03-17 | Disposition: A | Discharge status: Home / Self Care | Payer: PPO | Source: Ambulatory Visit | Attending: Orthopedic Surgery | Admitting: Orthopedic Surgery

## 2020-03-17 DIAGNOSIS — M9933 Osseous stenosis of neural canal of lumbar region: Secondary | ICD-10-CM

## 2020-03-17 DIAGNOSIS — M4807 Spinal stenosis, lumbosacral region: Secondary | ICD-10-CM

## 2020-03-17 DIAGNOSIS — M48061 Spinal stenosis, lumbar region without neurogenic claudication: Secondary | ICD-10-CM | POA: Diagnosis not present

## 2020-03-19 DIAGNOSIS — N401 Enlarged prostate with lower urinary tract symptoms: Secondary | ICD-10-CM | POA: Diagnosis not present

## 2020-03-19 DIAGNOSIS — R972 Elevated prostate specific antigen [PSA]: Secondary | ICD-10-CM | POA: Diagnosis not present

## 2020-03-21 DIAGNOSIS — R972 Elevated prostate specific antigen [PSA]: Secondary | ICD-10-CM | POA: Diagnosis not present

## 2020-03-21 DIAGNOSIS — N401 Enlarged prostate with lower urinary tract symptoms: Secondary | ICD-10-CM | POA: Diagnosis not present

## 2020-03-28 DIAGNOSIS — G8929 Other chronic pain: Secondary | ICD-10-CM | POA: Diagnosis not present

## 2020-03-28 DIAGNOSIS — M5442 Lumbago with sciatica, left side: Secondary | ICD-10-CM | POA: Diagnosis not present

## 2020-03-28 DIAGNOSIS — M48061 Spinal stenosis, lumbar region without neurogenic claudication: Secondary | ICD-10-CM | POA: Diagnosis not present

## 2020-03-28 DIAGNOSIS — R200 Anesthesia of skin: Secondary | ICD-10-CM | POA: Diagnosis not present

## 2020-04-03 DIAGNOSIS — G8929 Other chronic pain: Secondary | ICD-10-CM | POA: Diagnosis not present

## 2020-04-03 DIAGNOSIS — M5442 Lumbago with sciatica, left side: Secondary | ICD-10-CM | POA: Diagnosis not present

## 2020-04-18 DIAGNOSIS — M5442 Lumbago with sciatica, left side: Secondary | ICD-10-CM | POA: Diagnosis not present

## 2020-04-18 DIAGNOSIS — M25561 Pain in right knee: Secondary | ICD-10-CM | POA: Diagnosis not present

## 2020-04-18 DIAGNOSIS — M25562 Pain in left knee: Secondary | ICD-10-CM | POA: Diagnosis not present

## 2020-04-18 DIAGNOSIS — M48061 Spinal stenosis, lumbar region without neurogenic claudication: Secondary | ICD-10-CM | POA: Diagnosis not present

## 2020-04-18 DIAGNOSIS — G8929 Other chronic pain: Secondary | ICD-10-CM | POA: Diagnosis not present

## 2020-04-29 DIAGNOSIS — M25562 Pain in left knee: Secondary | ICD-10-CM | POA: Diagnosis not present

## 2020-04-29 DIAGNOSIS — M25561 Pain in right knee: Secondary | ICD-10-CM | POA: Diagnosis not present

## 2020-04-29 DIAGNOSIS — M25461 Effusion, right knee: Secondary | ICD-10-CM | POA: Diagnosis not present

## 2020-04-29 DIAGNOSIS — G8929 Other chronic pain: Secondary | ICD-10-CM | POA: Diagnosis not present

## 2020-04-29 DIAGNOSIS — M1712 Unilateral primary osteoarthritis, left knee: Secondary | ICD-10-CM | POA: Diagnosis not present

## 2020-04-29 DIAGNOSIS — M1711 Unilateral primary osteoarthritis, right knee: Secondary | ICD-10-CM | POA: Diagnosis not present

## 2020-04-29 DIAGNOSIS — R200 Anesthesia of skin: Secondary | ICD-10-CM | POA: Diagnosis not present

## 2020-04-29 DIAGNOSIS — M25462 Effusion, left knee: Secondary | ICD-10-CM | POA: Diagnosis not present

## 2020-05-26 DIAGNOSIS — I10 Essential (primary) hypertension: Secondary | ICD-10-CM | POA: Diagnosis not present

## 2020-05-26 DIAGNOSIS — E782 Mixed hyperlipidemia: Secondary | ICD-10-CM | POA: Diagnosis not present

## 2020-05-26 DIAGNOSIS — I2510 Atherosclerotic heart disease of native coronary artery without angina pectoris: Secondary | ICD-10-CM | POA: Diagnosis not present

## 2020-05-26 DIAGNOSIS — I119 Hypertensive heart disease without heart failure: Secondary | ICD-10-CM | POA: Diagnosis not present

## 2020-05-28 DIAGNOSIS — Z6832 Body mass index (BMI) 32.0-32.9, adult: Secondary | ICD-10-CM | POA: Diagnosis not present

## 2020-05-28 DIAGNOSIS — R7309 Other abnormal glucose: Secondary | ICD-10-CM | POA: Diagnosis not present

## 2020-05-28 DIAGNOSIS — M25552 Pain in left hip: Secondary | ICD-10-CM | POA: Diagnosis not present

## 2020-05-28 DIAGNOSIS — I38 Endocarditis, valve unspecified: Secondary | ICD-10-CM | POA: Diagnosis not present

## 2020-05-28 DIAGNOSIS — M48061 Spinal stenosis, lumbar region without neurogenic claudication: Secondary | ICD-10-CM | POA: Diagnosis not present

## 2020-05-28 DIAGNOSIS — M1990 Unspecified osteoarthritis, unspecified site: Secondary | ICD-10-CM | POA: Diagnosis not present

## 2020-05-28 DIAGNOSIS — I2510 Atherosclerotic heart disease of native coronary artery without angina pectoris: Secondary | ICD-10-CM | POA: Diagnosis not present

## 2020-05-28 DIAGNOSIS — Z955 Presence of coronary angioplasty implant and graft: Secondary | ICD-10-CM | POA: Diagnosis not present

## 2020-05-28 DIAGNOSIS — I10 Essential (primary) hypertension: Secondary | ICD-10-CM | POA: Diagnosis not present

## 2020-05-28 DIAGNOSIS — K2100 Gastro-esophageal reflux disease with esophagitis, without bleeding: Secondary | ICD-10-CM | POA: Diagnosis not present

## 2020-06-04 DIAGNOSIS — M1712 Unilateral primary osteoarthritis, left knee: Secondary | ICD-10-CM | POA: Diagnosis not present

## 2020-06-04 DIAGNOSIS — E782 Mixed hyperlipidemia: Secondary | ICD-10-CM | POA: Diagnosis not present

## 2020-06-04 DIAGNOSIS — Z6832 Body mass index (BMI) 32.0-32.9, adult: Secondary | ICD-10-CM | POA: Diagnosis not present

## 2020-06-04 DIAGNOSIS — Z8739 Personal history of other diseases of the musculoskeletal system and connective tissue: Secondary | ICD-10-CM | POA: Diagnosis not present

## 2020-06-04 DIAGNOSIS — I2510 Atherosclerotic heart disease of native coronary artery without angina pectoris: Secondary | ICD-10-CM | POA: Diagnosis not present

## 2020-06-04 DIAGNOSIS — Z85850 Personal history of malignant neoplasm of thyroid: Secondary | ICD-10-CM | POA: Diagnosis not present

## 2020-06-04 DIAGNOSIS — I10 Essential (primary) hypertension: Secondary | ICD-10-CM | POA: Diagnosis not present

## 2020-06-04 DIAGNOSIS — I38 Endocarditis, valve unspecified: Secondary | ICD-10-CM | POA: Diagnosis not present

## 2020-06-04 DIAGNOSIS — Z955 Presence of coronary angioplasty implant and graft: Secondary | ICD-10-CM | POA: Diagnosis not present

## 2020-06-04 DIAGNOSIS — R7309 Other abnormal glucose: Secondary | ICD-10-CM | POA: Diagnosis not present

## 2020-08-12 DIAGNOSIS — E519 Thiamine deficiency, unspecified: Secondary | ICD-10-CM | POA: Diagnosis not present

## 2020-08-12 DIAGNOSIS — R200 Anesthesia of skin: Secondary | ICD-10-CM | POA: Diagnosis not present

## 2020-08-12 DIAGNOSIS — H04123 Dry eye syndrome of bilateral lacrimal glands: Secondary | ICD-10-CM | POA: Diagnosis not present

## 2020-08-12 DIAGNOSIS — E531 Pyridoxine deficiency: Secondary | ICD-10-CM | POA: Diagnosis not present

## 2020-08-12 DIAGNOSIS — G8929 Other chronic pain: Secondary | ICD-10-CM | POA: Diagnosis not present

## 2020-08-12 DIAGNOSIS — M25512 Pain in left shoulder: Secondary | ICD-10-CM | POA: Diagnosis not present

## 2020-08-12 DIAGNOSIS — R202 Paresthesia of skin: Secondary | ICD-10-CM | POA: Diagnosis not present

## 2020-08-12 DIAGNOSIS — H269 Unspecified cataract: Secondary | ICD-10-CM | POA: Diagnosis not present

## 2020-08-12 DIAGNOSIS — E538 Deficiency of other specified B group vitamins: Secondary | ICD-10-CM | POA: Diagnosis not present

## 2020-08-12 DIAGNOSIS — Z7189 Other specified counseling: Secondary | ICD-10-CM | POA: Diagnosis not present

## 2020-08-12 DIAGNOSIS — E559 Vitamin D deficiency, unspecified: Secondary | ICD-10-CM | POA: Diagnosis not present

## 2020-09-01 DIAGNOSIS — M25462 Effusion, left knee: Secondary | ICD-10-CM | POA: Diagnosis not present

## 2020-09-01 DIAGNOSIS — M1712 Unilateral primary osteoarthritis, left knee: Secondary | ICD-10-CM | POA: Diagnosis not present

## 2020-09-01 DIAGNOSIS — M25461 Effusion, right knee: Secondary | ICD-10-CM | POA: Diagnosis not present

## 2020-09-01 DIAGNOSIS — G8929 Other chronic pain: Secondary | ICD-10-CM | POA: Diagnosis not present

## 2020-09-01 DIAGNOSIS — M1711 Unilateral primary osteoarthritis, right knee: Secondary | ICD-10-CM | POA: Diagnosis not present

## 2020-09-01 DIAGNOSIS — M25561 Pain in right knee: Secondary | ICD-10-CM | POA: Diagnosis not present

## 2020-09-01 DIAGNOSIS — M25562 Pain in left knee: Secondary | ICD-10-CM | POA: Diagnosis not present

## 2020-09-17 DIAGNOSIS — H2511 Age-related nuclear cataract, right eye: Secondary | ICD-10-CM | POA: Diagnosis not present

## 2020-09-23 DIAGNOSIS — R202 Paresthesia of skin: Secondary | ICD-10-CM | POA: Diagnosis not present

## 2020-09-23 DIAGNOSIS — R200 Anesthesia of skin: Secondary | ICD-10-CM | POA: Diagnosis not present

## 2020-10-07 DIAGNOSIS — I38 Endocarditis, valve unspecified: Secondary | ICD-10-CM | POA: Diagnosis not present

## 2020-10-07 DIAGNOSIS — M1712 Unilateral primary osteoarthritis, left knee: Secondary | ICD-10-CM | POA: Diagnosis not present

## 2020-10-07 DIAGNOSIS — I2510 Atherosclerotic heart disease of native coronary artery without angina pectoris: Secondary | ICD-10-CM | POA: Diagnosis not present

## 2020-10-07 DIAGNOSIS — Z6832 Body mass index (BMI) 32.0-32.9, adult: Secondary | ICD-10-CM | POA: Diagnosis not present

## 2020-10-07 DIAGNOSIS — Z8739 Personal history of other diseases of the musculoskeletal system and connective tissue: Secondary | ICD-10-CM | POA: Diagnosis not present

## 2020-10-07 DIAGNOSIS — Z955 Presence of coronary angioplasty implant and graft: Secondary | ICD-10-CM | POA: Diagnosis not present

## 2020-10-07 DIAGNOSIS — Z85850 Personal history of malignant neoplasm of thyroid: Secondary | ICD-10-CM | POA: Diagnosis not present

## 2020-10-07 DIAGNOSIS — R7309 Other abnormal glucose: Secondary | ICD-10-CM | POA: Diagnosis not present

## 2020-10-07 DIAGNOSIS — I10 Essential (primary) hypertension: Secondary | ICD-10-CM | POA: Diagnosis not present

## 2020-10-07 DIAGNOSIS — E782 Mixed hyperlipidemia: Secondary | ICD-10-CM | POA: Diagnosis not present

## 2020-10-14 DIAGNOSIS — M5442 Lumbago with sciatica, left side: Secondary | ICD-10-CM | POA: Diagnosis not present

## 2020-10-14 DIAGNOSIS — E782 Mixed hyperlipidemia: Secondary | ICD-10-CM | POA: Diagnosis not present

## 2020-10-14 DIAGNOSIS — Z0000 Encounter for general adult medical examination without abnormal findings: Secondary | ICD-10-CM | POA: Diagnosis not present

## 2020-10-14 DIAGNOSIS — I10 Essential (primary) hypertension: Secondary | ICD-10-CM | POA: Diagnosis not present

## 2020-10-14 DIAGNOSIS — I2510 Atherosclerotic heart disease of native coronary artery without angina pectoris: Secondary | ICD-10-CM | POA: Diagnosis not present

## 2020-10-14 DIAGNOSIS — R7309 Other abnormal glucose: Secondary | ICD-10-CM | POA: Diagnosis not present

## 2020-10-14 DIAGNOSIS — Z85850 Personal history of malignant neoplasm of thyroid: Secondary | ICD-10-CM | POA: Diagnosis not present

## 2020-10-14 DIAGNOSIS — M25562 Pain in left knee: Secondary | ICD-10-CM | POA: Diagnosis not present

## 2020-10-14 DIAGNOSIS — Z955 Presence of coronary angioplasty implant and graft: Secondary | ICD-10-CM | POA: Diagnosis not present

## 2020-10-14 DIAGNOSIS — Z8739 Personal history of other diseases of the musculoskeletal system and connective tissue: Secondary | ICD-10-CM | POA: Diagnosis not present

## 2020-10-14 DIAGNOSIS — G8929 Other chronic pain: Secondary | ICD-10-CM | POA: Diagnosis not present

## 2020-10-14 DIAGNOSIS — M25561 Pain in right knee: Secondary | ICD-10-CM | POA: Diagnosis not present

## 2020-11-11 DIAGNOSIS — R3915 Urgency of urination: Secondary | ICD-10-CM | POA: Diagnosis not present

## 2020-11-11 DIAGNOSIS — R972 Elevated prostate specific antigen [PSA]: Secondary | ICD-10-CM | POA: Diagnosis not present

## 2020-11-11 DIAGNOSIS — N401 Enlarged prostate with lower urinary tract symptoms: Secondary | ICD-10-CM | POA: Diagnosis not present

## 2020-11-12 DIAGNOSIS — N401 Enlarged prostate with lower urinary tract symptoms: Secondary | ICD-10-CM | POA: Diagnosis not present

## 2020-11-12 DIAGNOSIS — R972 Elevated prostate specific antigen [PSA]: Secondary | ICD-10-CM | POA: Diagnosis not present

## 2020-11-12 DIAGNOSIS — I10 Essential (primary) hypertension: Secondary | ICD-10-CM | POA: Diagnosis not present

## 2020-11-12 DIAGNOSIS — G629 Polyneuropathy, unspecified: Secondary | ICD-10-CM | POA: Diagnosis not present

## 2020-11-19 DIAGNOSIS — I10 Essential (primary) hypertension: Secondary | ICD-10-CM | POA: Diagnosis not present

## 2020-11-19 DIAGNOSIS — M1990 Unspecified osteoarthritis, unspecified site: Secondary | ICD-10-CM | POA: Diagnosis not present

## 2020-11-19 DIAGNOSIS — I2510 Atherosclerotic heart disease of native coronary artery without angina pectoris: Secondary | ICD-10-CM | POA: Diagnosis not present

## 2020-11-19 DIAGNOSIS — R457 State of emotional shock and stress, unspecified: Secondary | ICD-10-CM | POA: Diagnosis not present

## 2020-11-20 ENCOUNTER — Other Ambulatory Visit: Payer: Self-pay | Admitting: Urology

## 2020-11-20 DIAGNOSIS — R972 Elevated prostate specific antigen [PSA]: Secondary | ICD-10-CM

## 2020-11-20 DIAGNOSIS — N401 Enlarged prostate with lower urinary tract symptoms: Secondary | ICD-10-CM | POA: Diagnosis not present

## 2020-11-26 DIAGNOSIS — N401 Enlarged prostate with lower urinary tract symptoms: Secondary | ICD-10-CM | POA: Diagnosis not present

## 2020-11-26 DIAGNOSIS — R972 Elevated prostate specific antigen [PSA]: Secondary | ICD-10-CM | POA: Diagnosis not present

## 2020-12-01 DIAGNOSIS — N401 Enlarged prostate with lower urinary tract symptoms: Secondary | ICD-10-CM | POA: Diagnosis not present

## 2020-12-01 DIAGNOSIS — R972 Elevated prostate specific antigen [PSA]: Secondary | ICD-10-CM | POA: Diagnosis not present

## 2020-12-03 ENCOUNTER — Ambulatory Visit
Admission: RE | Admit: 2020-12-03 | Discharge: 2020-12-03 | Disposition: A | Discharge status: Home / Self Care | Payer: PPO | Source: Ambulatory Visit | Attending: Urology | Admitting: Urology

## 2020-12-03 ENCOUNTER — Other Ambulatory Visit: Payer: Self-pay

## 2020-12-03 DIAGNOSIS — N402 Nodular prostate without lower urinary tract symptoms: Secondary | ICD-10-CM | POA: Diagnosis not present

## 2020-12-03 DIAGNOSIS — R972 Elevated prostate specific antigen [PSA]: Secondary | ICD-10-CM | POA: Diagnosis not present

## 2020-12-03 DIAGNOSIS — N401 Enlarged prostate with lower urinary tract symptoms: Secondary | ICD-10-CM | POA: Diagnosis not present

## 2020-12-03 DIAGNOSIS — N419 Inflammatory disease of prostate, unspecified: Secondary | ICD-10-CM | POA: Diagnosis not present

## 2020-12-03 DIAGNOSIS — R590 Localized enlarged lymph nodes: Secondary | ICD-10-CM | POA: Diagnosis not present

## 2020-12-03 MED ORDER — GADOBUTROL 1 MMOL/ML IV SOLN
7.5000 mL | Freq: Once | INTRAVENOUS | Status: AC | PRN
  Administered 2020-12-03: 11:00:00 7.5 mL via INTRAVENOUS

## 2020-12-08 DIAGNOSIS — N401 Enlarged prostate with lower urinary tract symptoms: Secondary | ICD-10-CM | POA: Diagnosis not present

## 2020-12-08 DIAGNOSIS — R972 Elevated prostate specific antigen [PSA]: Secondary | ICD-10-CM | POA: Diagnosis not present

## 2020-12-16 DIAGNOSIS — I10 Essential (primary) hypertension: Secondary | ICD-10-CM | POA: Diagnosis not present

## 2020-12-16 DIAGNOSIS — I2510 Atherosclerotic heart disease of native coronary artery without angina pectoris: Secondary | ICD-10-CM | POA: Diagnosis not present

## 2020-12-16 DIAGNOSIS — Z955 Presence of coronary angioplasty implant and graft: Secondary | ICD-10-CM | POA: Diagnosis not present

## 2020-12-16 DIAGNOSIS — I119 Hypertensive heart disease without heart failure: Secondary | ICD-10-CM | POA: Diagnosis not present

## 2020-12-16 DIAGNOSIS — I38 Endocarditis, valve unspecified: Secondary | ICD-10-CM | POA: Diagnosis not present

## 2020-12-16 DIAGNOSIS — E782 Mixed hyperlipidemia: Secondary | ICD-10-CM | POA: Diagnosis not present

## 2020-12-22 ENCOUNTER — Encounter
Admission: RE | Admit: 2020-12-22 | Discharge: 2020-12-22 | Disposition: A | Discharge status: Home / Self Care | Payer: PPO | Source: Ambulatory Visit | Attending: Urology | Admitting: Urology

## 2020-12-22 NOTE — Patient Instructions (Addendum)
Your procedure is scheduled on: 01-01-2021- Thursday Report to the Registration Desk on the 1st floor of the [facility]. To find out your arrival time, please call [phone] between 1PM - 3PM on: 12/31/2020- Wednesday  REMEMBER: Instructions that are not followed completely may result in serious medical risk, up to and including death; or upon the discretion of your surgeon and anesthesiologist your surgery may need to be rescheduled.  Do not eat food after midnight the night before surgery.  No gum chewing, lozengers or hard candies.  You may however, drink CLEAR liquids up to 2 hours before you are scheduled to arrive for your surgery. Do not drink anything within 2 hours of your scheduled arrival time.  Clear liquids include: - water  - apple juice without pulp - gatorade (not RED, PURPLE, OR BLUE) - black coffee or tea (Do NOT add milk or creamers to the coffee or tea) Do NOT drink anything that is not on this list.  TAKE THESE MEDICATIONS THE MORNING OF SURGERY WITH A SIP OF WATER: - allopurinol (ZYLOPRIM) 100 MG tablet - carvedilol (COREG) 3.125 MG tablet  - dutasteride (AVODART) 0.5 MG capsule - gabapentin (NEURONTIN) 300 MG capsule - levothyroxine (SYNTHROID, LEVOTHROID) 137 MCG tablet  Follow recommendations from Cardiologist, Pulmonologist or PCP regarding stopping Aspirin, Coumadin, Plavix, Eliquis, Pradaxa, or Pletal.  Aspirin 81 mg stopped on 12/22/2020.   One week prior to surgery: Stop Anti-inflammatories (NSAIDS) such as Advil, Aleve, Ibuprofen, Motrin, Naproxen, Naprosyn and Aspirin based products such as Excedrin, Goodys Powder, BC Powder.  Stop ANY OVER THE COUNTER supplements starting 12/23/20 until after surgery: Alpha-Lipoic Acid 600 MG CAPS (However, you may continue taking Vitamin D, Vitamin B, and multivitamin up until the day before surgery.)  No Alcohol for 24 hours before or after surgery.  No Smoking including e-cigarettes for 24 hours prior to  surgery.  No chewable tobacco products for at least 6 hours prior to surgery.  No nicotine patches on the day of surgery.  Do not use any "recreational" drugs for at least a week prior to your surgery.  Please be advised that the combination of cocaine and anesthesia may have negative outcomes, up to and including death. If you test positive for cocaine, your surgery will be cancelled.  On the morning of surgery brush your teeth with toothpaste and water, you may rinse your mouth with mouthwash if you wish. Do not swallow any toothpaste or mouthwash.  Do not wear jewelry, make-up, hairpins, clips or nail polish.  Do not wear lotions, powders, or perfumes.   Do not shave body from the neck down 48 hours prior to surgery just in case you cut yourself which could leave a site for infection.  Also, freshly shaved skin may become irritated if using the CHG soap.  Contact lenses, hearing aids and dentures may not be worn into surgery.  Do not bring valuables to the hospital. [facility] is not responsible for any missing/lost belongings or valuables.   Notify your doctor if there is any change in your medical condition (cold, fever, infection).  Wear comfortable clothing (specific to your surgery type) to the hospital.  Plan for stool softeners for home use; pain medications have a tendency to cause constipation. You can also help prevent constipation by eating foods high in fiber such as fruits and vegetables and drinking plenty of fluids as your diet allows.  After surgery, you can help prevent lung complications by doing breathing exercises.  Take deep  breaths and cough every 1-2 hours. Your doctor may order a device called an Incentive Spirometer to help you take deep breaths. When coughing or sneezing, hold a pillow firmly against your incision with both hands. This is called "splinting." Doing this helps protect your incision. It also decreases belly discomfort.  If you are being  admitted to the hospital overnight, leave your suitcase in the car. After surgery it may be brought to your room.  If you are being discharged the day of surgery, you will not be allowed to drive home. You will need a responsible adult (18 years or older) to drive you home and stay with you that night.   If you are taking public transportation, you will need to have a responsible adult (18 years or older) with you. Please confirm with your physician that it is acceptable to use public transportation.   Please call the [facility] at [phone] if you have any questions about these instructions.  Visitation Policy:  Patients undergoing a surgery or procedure may have one family member or support person with them as long as that person is not COVID-19 positive or experiencing its symptoms.  That person may remain in the waiting area during the procedure.  Inpatient Visitation:    Visiting hours are 7 a.m. to 8 p.m. Patients will be allowed one visitor. The visitor may change daily. The visitor must pass COVID-19 screenings, use hand sanitizer when entering and exiting the patient's room and wear a mask at all times, including in the patient's room. Patients must also wear a mask when staff or their visitor are in the room. Masking is required regardless of vaccination status. Systemwide, no visitors 17 or younger.

## 2020-12-23 NOTE — H&P (Signed)
Keith Williamson, Keith Williamson MEDICAL RECORD [id number] ACCOUNT [id number] DATE OF BIRTH:[birth year] FACILITY: [facility] LOCATION:  PHYSICIAN:Aliegha Paullin Farrel Conners, MD  HISTORY AND PHYSICAL  DATE OF ADMISSION:  01/01/2021  CHIEF COMPLAINT:  Difficulty voiding.  HISTORY OF PRESENT ILLNESS:  The patient is a 75 year old African American male with a long history of BPH and lower urinary tract symptoms.  He is currently being managed with silodosin and dutasteride.  He continues to have significant lower urinary  tract symptoms in spite of the medication.  He was found to have an elevated PSA of 8.7 ng/mL on 03/01.  MRI scan indicated that he had a 120 mL volume prostate without any high-grade PI-RADS lesions.  Uroflow study in December indicated maximum flow  rate of 8 mL per second and an average flow rate of 4.3 mL per second and a postvoid residual of 16  mL.  Cystoscopy on 12/20 indicated trilobar BPH with a small posterior median lobe.  The patient comes in now for photovaporization of prostate with the  GreenLight laser.  ALLERGIES:  INCLUDED NORVASC, HYDROCHLOROTHIAZIDE, BETA-BLOCKERS, VACCINES OF UNKNOWN VARIETY, AND LEVSIN.  CURRENT MEDICATIONS:  Include vitamin D, alpha lipoic acid, candesartan, Levothroid, aspirin, colchicine, nitroglycerin p.r.n., Cardizem, allopurinol, Flonase nasal spray, silodosin, dutasteride, ezetimibe, Crestor.  PAST SURGICAL HISTORY: 1.  Thyroidectomy for thyroid cancer in 2007 followed by iodine treatment. 2.  Lumbar laminectomy in 2011. 3.  Left knee arthroscopy in 2012. 4.  Lumbar laminectomy in 2013. 5.  Cardiac stent placement in 2014. 6.  TUMT of the prostate in 2018. 7.  Prostate biopsy with benign histology in 2019. 8.  Cardiac stress test in 2020, which was normal. 9.  Epidural injection for spinal stenosis in 2021.  PAST AND CURRENT MEDICAL CONDITIONS: 1.  Hypertension. 2.  Hypercholesterolemia. 3.  Coronary artery disease. 4.  Gout.  REVIEW OF  SYSTEMS:  The patient denies chest pain, shortness of breath, diabetes, or stroke.  SOCIAL HISTORY:  The patient denied tobacco use.  He quit smoking in 1986 with a 4-pack-year history.  FAMILY HISTORY:  Father died at age 10 of an unspecified cancer.  Mother died at age 66 of heart disease.  There is no family history of prostate cancer.  PHYSICAL EXAMINATION: VITAL SIGNS:  Height 6 feet 1 inch, weight 252 pounds, BMI 33. GENERAL:  Well-nourished African American male in no acute distress. HEENT:  Sclerae were clear. NECK:  No audible carotid bruits.  No palpable thyroid nodules. PULMONARY:  Lungs clear to auscultation. CARDIOVASCULAR:  Regular rhythm and rate without audible murmurs. LYMPHATIC:  No palpable cervical or inguinal adenopathy. GENITOURINARY:  Uncircumcised.  Testes were smooth, nontender.  Left testis was atrophic. RECTAL:  Greater than 50 gram smooth, nontender prostate. NEUROMUSCULAR:  Alert and oriented x3.  IMPRESSION: 1.  Benign prostatic hypertrophy with bladder outlet obstruction. 2.  Elevated PSA due to benign prostatic hypertrophy.  PLAN:  Photovaporization of prostate with the GreenLight laser.  IN/NUANCE  D:12/22/2020 T:12/22/2020 JOB:[id number]

## 2020-12-25 ENCOUNTER — Encounter: Payer: Self-pay | Admitting: Urology

## 2020-12-25 NOTE — Progress Notes (Signed)
[facility] Perioperative Services  Pre-Admission/Anesthesia Testing Clinical Review  Date: 12/25/20  Patient Demographics:  Name: Keith Williamson DOB:   1946-03-13 MRN:   [id number]  Planned Surgical Procedure(s):    Case: [id number] Date/Time: 01/01/21 0715   Procedure: GREEN LIGHT LASER TURP (TRANSURETHRAL RESECTION OF PROSTATE (N/A )   Anesthesia type: Choice   Pre-op diagnosis: ENLARGED PROSTATE ELEVATED PSA   Location: [facility] OR ROOM 10 / [place] ORS FOR ANESTHESIA GROUP   Surgeons: Royston Cowper, MD    NOTE: Available PAT nursing documentation and vital signs have been reviewed. Clinical nursing staff has updated patient's PMH/PSHx, current medication list, and drug allergies/intolerances to ensure comprehensive history available to assist in medical decision making as it pertains to the aforementioned surgical procedure and anticipated anesthetic course.   Clinical Discussion:  Keith Williamson is a 75 y.o. male who is submitted for pre-surgical anesthesia review and clearance prior to him undergoing the above procedure. Patient is a Former Smoker (quit 12/1981). Pertinent PMH includes: CAD, inferior MI (2014; s/p PCI), valvular heart disease, HTN, HLD, GERD (OTC Tx as needed), hypothyroidism, thyroid cancer, OA, BPH  Patient is followed by cardiology Nehemiah Massed, MD). He was last seen in the cardiology clinic on 12/16/2020; notes reviewed.  At the time of his clinic visit, patient noted to be doing well overall from a cardiovascular perspective. He denied chest pain, shortness of breath, orthopnea, PND, palpitations, peripheral edema, vertiginous symptoms, and presyncope/syncope.  Patient has used PMH (+) for inferior MI in 2014. Patient underwent PCI with stent placement to the RCA. Patient underwent a Lexiscan on 04/06/2016 that revealed no evidence of stress-induced myocardial ischemia or arrhythmia; LVEF 52%. TTE performed on 05/24/2019 revealed normal left  ventricular systolic function with moderate LVH and mild valvular insufficiency; LVEF 50% (see full interpretation of cardiovascular testing below). Patient on GDMT for his HTN and HLD diagnoses. Blood pressure moderately controlled at 148/88 on currently prescribed ARB, beta-blocker, CCB therapies. Patient is on a statin for his HLD. Patient has no structured exercise regimen, however remains active. Functional capacity, as defined by DASI, is documented as being >/= 4 METS.  ECG in the office revealed sinus bradycardia with first-degree AV block at a rate of 57 bpm. No changes were made to patient's medication regimen. Patient follow-up with outpatient cardiology in 6 months.  Patient is scheduled to undergo urological procedure on 01/01/2021 with Dr. Maryan Puls.  Given patient's past medical history significant for cardiovascular disease and intervention, presurgical cardiac clearance was sought by the PAT team.  Per cardiology, "at this time, patient appears to be at a LOW risk for possible cardiovascular complications from surgery.  He is optimized from medical standpoint and may proceed to planned".  This patient is on daily antiplatelet therapy.  He has been instructed on recommendations from cardiology and urology for holding his daily low-dose ASA for 10 days prior to his procedure.  Patient's last dose of ASA will be on 12/21/2020.  He denies previous perioperative complications with anesthesia. He underwent a general anesthetic course here (ASA III) in 04/2016 with no documented complications.   Vitals with BMI 05/07/2016 05/07/2016 05/07/2016  Height - - -  Weight - - -  BMI - - -  Systolic 510 258 92  Diastolic 93 82 73  Pulse 79 76 69    Providers/Specialists:   NOTE: Primary physician provider listed below. Patient may have been seen by APP or partner within same practice.   PROVIDER ROLE /  SPECIALTY LAST Myra Gianotti, MD Urology (Surgeon)  12/09/2020  Barbette Reichmann,  MD Primary Care Provider  10/14/2020  Arnoldo Hooker, MD Cardiology  12/16/2020   Allergies:  Levsin [hyoscyamine], Beta adrenergic blockers, Hydralazine, Hydrochlorothiazide, Norvasc [amlodipine], Other, and Zetia [ezetimibe]  Current Home Medications:   No current facility-administered medications for this encounter.   Marland Kitchen allopurinol (ZYLOPRIM) 100 MG tablet  . Alpha-Lipoic Acid 600 MG CAPS  . aspirin 81 MG tablet  . candesartan (ATACAND) 32 MG tablet  . carvedilol (COREG) 3.125 MG tablet  . diltiazem (CARDIZEM CD) 240 MG 24 hr capsule  . dutasteride (AVODART) 0.5 MG capsule  . gabapentin (NEURONTIN) 300 MG capsule  . levothyroxine (SYNTHROID, LEVOTHROID) 137 MCG tablet  . Multiple Vitamins-Minerals (MULTIVITAMIN WITH MINERALS) tablet  . nitroGLYCERIN (NITROSTAT) 0.4 MG SL tablet  . rosuvastatin (CRESTOR) 40 MG tablet  . silodosin (RAPAFLO) 8 MG CAPS capsule   History:   Past Medical History:  Diagnosis Date  . Arthritis   . BPH (benign prostatic hypertrophy)   . Cancer (HCC)    hx thyroid cancer with iodine tx  . Enlarged prostate   . GERD (gastroesophageal reflux disease)   . Headache(784.0)    frequent sinus headache  . Hurthle cell tumor   . Hx of thyroid cancer   . Hyperlipidemia   . Hypertension   . Hypothyroidism   . Retina hole   . Senile nuclear sclerosis   . Thyroid disease   . Valvular heart disease    Past Surgical History:  Procedure Laterality Date  . ANGIOPLASTY / STENTING FEMORAL    . BACK SURGERY    . COLONOSCOPY WITH PROPOFOL N/A 05/07/2016   Procedure: COLONOSCOPY WITH PROPOFOL;  Surgeon: Christena Deem, MD;  Location[facility] ENDOSCOPY;  Service: Endoscopy;  Laterality: N/A;  . ESOPHAGOGASTRODUODENOSCOPY (EGD) WITH PROPOFOL N/A 05/07/2016   Procedure: ESOPHAGOGASTRODUODENOSCOPY (EGD) WITH PROPOFOL;  Surgeon: Christena Deem, MD;  Location: [facility] ENDOSCOPY;  Service: Endoscopy;  Laterality: N/A;  . KNEE ARTHROSCOPY Left    Medial and lateral  meniscectomy, partial  . LUMBAR LAMINECTOMY  08/16/2012   L 3 4 & 5  . LUMBAR MICRODISCECTOMY     L3-L4; with partial hemilaminectomy, partial facetectomy and foraminotomy  . TOTAL THYROIDECTOMY    . TURP VAPORIZATION     No family history on file. Social History   Tobacco Use  . Smoking status: Former Smoker    Quit date: 12/17/1981    Years since quitting: 39.0  . Smokeless tobacco: Never Used  Substance Use Topics  . Alcohol use: No  . Drug use: No    Pertinent Clinical Results:  LABS: Labs reviewed: Acceptable for surgery.        ECG: Date: 05/26/2020 Rate: 75 bpm Rhythm: Sinus rhythm with first-degree AV block Axis (leads I and aVF): Left axis deviation Intervals: PR 256 ms. QRS 106 ms. QTc 451 ms. ST segment and T wave changes: No evidence of acute ST segment elevation or depression Comparison: Similar to previous tracing obtained on 06/21/2018 NOTE: Tracing obtained at [facility]; unable for review. Above based on cardiologist's interpretation.    IMAGING / PROCEDURES: MRI PROSTATE W/WO CONTRAST performed on 12/03/2020 1. Transitional zone prostatic megaly with signs of BPH 2. Numerous nodules present 3. Volume: 6.3 x 5.7 x 6.2 cm (volume 120 cc) 4. No evidence of high risk lesion 5. Overall assessment BI-RADS Category 2 with changes of BPH and sequela of prior prostatitis  ECHOCARDIOGRAM performed  on 05/24/2019 1. LVEF 50% 2. Normal left ventricular systolic function with moderate LVH 3. Normal right ventricular systolic function 4. Mild AR, TR, PR; trivial MR 5. No valvular stenosis 6. No evidence of pericardial effusion  LEXISCAN performed on 04/06/2016 1. LVEF 52% 2. Regional wall motion reveals normal myocardial thickening and wall motion 3. The left ventricular cavity size is normal 4. No artifacts noted 5. No evidence of stress-induced myocardial ischemia or arrhythmia; no scar 6. The overall quality of the study is good   Impression  and Plan:  Keith Williamson has been referred for pre-anesthesia review and clearance prior to him undergoing the planned anesthetic and procedural courses. Available labs, pertinent testing, and imaging results were personally reviewed by me. This patient has been appropriately cleared by cardiology with an overall LOW risk of perioperative cardiovascular complications.   Based on clinical review performed today (12/25/20), barring any significant acute changes in the patient's overall condition, it is anticipated that he will be able to proceed with the planned surgical intervention. Any acute changes in clinical condition may necessitate his procedure being postponed and/or cancelled. Pre-surgical instructions were reviewed with the patient during his PAT appointment and questions were fielded by PAT clinical staff.  Honor Loh, MSN, APRN, FNP-C, CEN [facility]  Peri-operative Services Nurse Practitioner Phone: [phone] 12/25/20 12:15 PM  NOTE: This note has been prepared using Dragon dictation software. Despite my best ability to proofread, there is always the potential that unintentional transcriptional errors may still occur from this process.

## 2020-12-26 DIAGNOSIS — N492 Inflammatory disorders of scrotum: Secondary | ICD-10-CM | POA: Diagnosis not present

## 2020-12-26 DIAGNOSIS — R972 Elevated prostate specific antigen [PSA]: Secondary | ICD-10-CM | POA: Diagnosis not present

## 2020-12-26 DIAGNOSIS — N401 Enlarged prostate with lower urinary tract symptoms: Secondary | ICD-10-CM | POA: Diagnosis not present

## 2020-12-30 ENCOUNTER — Other Ambulatory Visit
Admission: RE | Admit: 2020-12-30 | Discharge: 2020-12-30 | Disposition: A | Discharge status: Home / Self Care | Payer: PPO | Source: Ambulatory Visit | Attending: Urology | Admitting: Urology

## 2020-12-30 ENCOUNTER — Other Ambulatory Visit: Payer: Self-pay

## 2020-12-30 DIAGNOSIS — Z01812 Encounter for preprocedural laboratory examination: Secondary | ICD-10-CM | POA: Insufficient documentation

## 2020-12-30 DIAGNOSIS — Z20822 Contact with and (suspected) exposure to covid-19: Secondary | ICD-10-CM | POA: Diagnosis not present

## 2020-12-31 LAB — SARS CORONAVIRUS 2 (TAT 6-24 HRS): SARS Coronavirus 2: NEGATIVE

## 2021-01-01 ENCOUNTER — Ambulatory Visit: Payer: PPO | Admitting: Urgent Care

## 2021-01-01 ENCOUNTER — Encounter: Payer: Self-pay | Admitting: Urology

## 2021-01-01 ENCOUNTER — Encounter
Admission: RE | Disposition: A | Discharge status: Home / Self Care | Payer: Self-pay | Source: Home / Self Care | Attending: Urology

## 2021-01-01 ENCOUNTER — Other Ambulatory Visit: Payer: Self-pay

## 2021-01-01 ENCOUNTER — Ambulatory Visit
Admission: RE | Admit: 2021-01-01 | Discharge: 2021-01-01 | Disposition: A | Discharge status: Home / Self Care | Payer: PPO | Attending: Urology | Admitting: Urology

## 2021-01-01 DIAGNOSIS — Z7982 Long term (current) use of aspirin: Secondary | ICD-10-CM | POA: Diagnosis not present

## 2021-01-01 DIAGNOSIS — Z8249 Family history of ischemic heart disease and other diseases of the circulatory system: Secondary | ICD-10-CM | POA: Diagnosis not present

## 2021-01-01 DIAGNOSIS — N320 Bladder-neck obstruction: Secondary | ICD-10-CM | POA: Insufficient documentation

## 2021-01-01 DIAGNOSIS — Z79899 Other long term (current) drug therapy: Secondary | ICD-10-CM | POA: Insufficient documentation

## 2021-01-01 DIAGNOSIS — N401 Enlarged prostate with lower urinary tract symptoms: Secondary | ICD-10-CM | POA: Insufficient documentation

## 2021-01-01 DIAGNOSIS — Z87891 Personal history of nicotine dependence: Secondary | ICD-10-CM | POA: Insufficient documentation

## 2021-01-01 DIAGNOSIS — Z888 Allergy status to other drugs, medicaments and biological substances status: Secondary | ICD-10-CM | POA: Insufficient documentation

## 2021-01-01 DIAGNOSIS — N400 Enlarged prostate without lower urinary tract symptoms: Secondary | ICD-10-CM | POA: Diagnosis not present

## 2021-01-01 DIAGNOSIS — Z887 Allergy status to serum and vaccine status: Secondary | ICD-10-CM | POA: Insufficient documentation

## 2021-01-01 DIAGNOSIS — Z809 Family history of malignant neoplasm, unspecified: Secondary | ICD-10-CM | POA: Insufficient documentation

## 2021-01-01 DIAGNOSIS — N138 Other obstructive and reflux uropathy: Secondary | ICD-10-CM | POA: Diagnosis not present

## 2021-01-01 DIAGNOSIS — K219 Gastro-esophageal reflux disease without esophagitis: Secondary | ICD-10-CM | POA: Diagnosis not present

## 2021-01-01 DIAGNOSIS — R9721 Rising PSA following treatment for malignant neoplasm of prostate: Secondary | ICD-10-CM | POA: Diagnosis not present

## 2021-01-01 DIAGNOSIS — Z955 Presence of coronary angioplasty implant and graft: Secondary | ICD-10-CM | POA: Diagnosis not present

## 2021-01-01 DIAGNOSIS — E039 Hypothyroidism, unspecified: Secondary | ICD-10-CM | POA: Diagnosis not present

## 2021-01-01 DIAGNOSIS — I2510 Atherosclerotic heart disease of native coronary artery without angina pectoris: Secondary | ICD-10-CM | POA: Diagnosis not present

## 2021-01-01 DIAGNOSIS — Z85850 Personal history of malignant neoplasm of thyroid: Secondary | ICD-10-CM | POA: Diagnosis not present

## 2021-01-01 DIAGNOSIS — M48061 Spinal stenosis, lumbar region without neurogenic claudication: Secondary | ICD-10-CM | POA: Diagnosis not present

## 2021-01-01 HISTORY — PX: GREEN LIGHT LASER TURP (TRANSURETHRAL RESECTION OF PROSTATE: SHX6260

## 2021-01-01 HISTORY — DX: Unspecified osteoarthritis, unspecified site: M19.90

## 2021-01-01 HISTORY — DX: Diverticulosis of intestine, part unspecified, without perforation or abscess without bleeding: K57.90

## 2021-01-01 HISTORY — DX: Atherosclerotic heart disease of native coronary artery without angina pectoris: I25.10

## 2021-01-01 HISTORY — DX: Malignant neoplasm of thyroid gland: C73

## 2021-01-01 SURGERY — GREEN LIGHT LASER TURP (TRANSURETHRAL RESECTION OF PROSTATE
Anesthesia: General

## 2021-01-01 MED ORDER — SUGAMMADEX SODIUM 500 MG/5ML IV SOLN
INTRAVENOUS | Status: DC | PRN
  Administered 2021-01-01: 500 mg via INTRAVENOUS

## 2021-01-01 MED ORDER — DEXAMETHASONE SODIUM PHOSPHATE 10 MG/ML IJ SOLN
INTRAMUSCULAR | Status: AC
  Filled 2021-01-01: qty 1

## 2021-01-01 MED ORDER — PHENYLEPHRINE HCL (PRESSORS) 10 MG/ML IV SOLN
INTRAVENOUS | Status: AC
  Filled 2021-01-01: qty 1

## 2021-01-01 MED ORDER — ONDANSETRON HCL 4 MG/2ML IJ SOLN
INTRAMUSCULAR | Status: AC
  Filled 2021-01-01: qty 2

## 2021-01-01 MED ORDER — LIDOCAINE HCL (PF) 2 % IJ SOLN
INTRAMUSCULAR | Status: AC
  Filled 2021-01-01: qty 5

## 2021-01-01 MED ORDER — SUGAMMADEX SODIUM 500 MG/5ML IV SOLN
INTRAVENOUS | Status: AC
  Filled 2021-01-01: qty 5

## 2021-01-01 MED ORDER — DEXMEDETOMIDINE (PRECEDEX) IN NS 20 MCG/5ML (4 MCG/ML) IV SYRINGE
PREFILLED_SYRINGE | INTRAVENOUS | Status: AC
  Filled 2021-01-01: qty 5

## 2021-01-01 MED ORDER — PHENYLEPHRINE HCL (PRESSORS) 10 MG/ML IV SOLN
INTRAVENOUS | Status: DC | PRN
  Administered 2021-01-01 (×3): 50 ug via INTRAVENOUS

## 2021-01-01 MED ORDER — PROPOFOL 10 MG/ML IV BOLUS
INTRAVENOUS | Status: DC | PRN
  Administered 2021-01-01: 120 mg via INTRAVENOUS

## 2021-01-01 MED ORDER — DOCUSATE SODIUM 100 MG PO CAPS: 200.0000 mg | ORAL_CAPSULE | Freq: Two times a day (BID) | ORAL | 3 refills | Status: AC

## 2021-01-01 MED ORDER — DEXAMETHASONE SODIUM PHOSPHATE 10 MG/ML IJ SOLN
INTRAMUSCULAR | Status: DC | PRN
  Administered 2021-01-01: 8 mg via INTRAVENOUS

## 2021-01-01 MED ORDER — CEFAZOLIN SODIUM-DEXTROSE 1-4 GM/50ML-% IV SOLN
1.0000 g | Freq: Once | INTRAVENOUS | Status: AC
  Administered 2021-01-01: 2 g via INTRAVENOUS

## 2021-01-01 MED ORDER — FENTANYL CITRATE (PF) 100 MCG/2ML IJ SOLN
INTRAMUSCULAR | Status: AC
  Filled 2021-01-01: qty 2

## 2021-01-01 MED ORDER — SUCCINYLCHOLINE CHLORIDE 20 MG/ML IJ SOLN
INTRAMUSCULAR | Status: DC | PRN
  Administered 2021-01-01: 110 mg via INTRAVENOUS

## 2021-01-01 MED ORDER — LACTATED RINGERS IV SOLN: INTRAVENOUS | Status: DC

## 2021-01-01 MED ORDER — FENTANYL CITRATE (PF) 100 MCG/2ML IJ SOLN: 25.0000 ug | INTRAMUSCULAR | Status: DC | PRN

## 2021-01-01 MED ORDER — ACETAMINOPHEN 10 MG/ML IV SOLN
INTRAVENOUS | Status: DC | PRN
  Administered 2021-01-01: 1000 mg via INTRAVENOUS

## 2021-01-01 MED ORDER — SUCCINYLCHOLINE CHLORIDE 200 MG/10ML IV SOSY
PREFILLED_SYRINGE | INTRAVENOUS | Status: AC
  Filled 2021-01-01: qty 10

## 2021-01-01 MED ORDER — LIDOCAINE HCL URETHRAL/MUCOSAL 2 % EX GEL
CUTANEOUS | Status: DC | PRN
  Administered 2021-01-01: 1 via URETHRAL

## 2021-01-01 MED ORDER — FENTANYL CITRATE (PF) 100 MCG/2ML IJ SOLN
INTRAMUSCULAR | Status: DC | PRN
  Administered 2021-01-01: 50 ug via INTRAVENOUS

## 2021-01-01 MED ORDER — PHENAZOPYRIDINE HCL 200 MG PO TABS: 200.0000 mg | ORAL_TABLET | Freq: Three times a day (TID) | ORAL | 3 refills | Status: DC | PRN

## 2021-01-01 MED ORDER — ONDANSETRON HCL 4 MG/2ML IJ SOLN
INTRAMUSCULAR | Status: DC | PRN
  Administered 2021-01-01: 4 mg via INTRAVENOUS

## 2021-01-01 MED ORDER — ROCURONIUM BROMIDE 10 MG/ML (PF) SYRINGE
PREFILLED_SYRINGE | INTRAVENOUS | Status: AC
  Filled 2021-01-01: qty 10

## 2021-01-01 MED ORDER — KETAMINE HCL 50 MG/5ML IJ SOSY
PREFILLED_SYRINGE | INTRAMUSCULAR | Status: AC
  Filled 2021-01-01: qty 5

## 2021-01-01 MED ORDER — ONDANSETRON HCL 4 MG/2ML IJ SOLN: 4.0000 mg | Freq: Once | INTRAMUSCULAR | Status: DC | PRN

## 2021-01-01 MED ORDER — CHLORHEXIDINE GLUCONATE 0.12 % MT SOLN: 15.0000 mL | Freq: Once | OROMUCOSAL | Status: AC

## 2021-01-01 MED ORDER — CEFAZOLIN SODIUM-DEXTROSE 1-4 GM/50ML-% IV SOLN
INTRAVENOUS | Status: AC
  Filled 2021-01-01: qty 50

## 2021-01-01 MED ORDER — ORAL CARE MOUTH RINSE: 15.0000 mL | Freq: Once | OROMUCOSAL | Status: AC

## 2021-01-01 MED ORDER — KETAMINE HCL 10 MG/ML IJ SOLN
INTRAMUSCULAR | Status: DC | PRN
  Administered 2021-01-01: 10 mg via INTRAVENOUS
  Administered 2021-01-01: 20 mg via INTRAVENOUS

## 2021-01-01 MED ORDER — EPHEDRINE 5 MG/ML INJ
INTRAVENOUS | Status: AC
  Filled 2021-01-01: qty 10

## 2021-01-01 MED ORDER — ACETAMINOPHEN 10 MG/ML IV SOLN
INTRAVENOUS | Status: AC
  Filled 2021-01-01: qty 100

## 2021-01-01 MED ORDER — FAMOTIDINE 20 MG PO TABS: 20.0000 mg | ORAL_TABLET | Freq: Once | ORAL | Status: AC

## 2021-01-01 MED ORDER — CHLORHEXIDINE GLUCONATE 0.12 % MT SOLN
OROMUCOSAL | Status: AC
  Administered 2021-01-01: 15 mL via OROMUCOSAL
  Filled 2021-01-01: qty 15

## 2021-01-01 MED ORDER — EPHEDRINE SULFATE 50 MG/ML IJ SOLN
INTRAMUSCULAR | Status: DC | PRN
  Administered 2021-01-01: 10 mg via INTRAVENOUS
  Administered 2021-01-01 (×2): 5 mg via INTRAVENOUS

## 2021-01-01 MED ORDER — LIDOCAINE HCL URETHRAL/MUCOSAL 2 % EX GEL
CUTANEOUS | Status: AC
  Filled 2021-01-01: qty 10

## 2021-01-01 MED ORDER — CIPROFLOXACIN HCL 500 MG PO TABS: 500.0000 mg | ORAL_TABLET | Freq: Two times a day (BID) | ORAL | 0 refills | Status: DC

## 2021-01-01 MED ORDER — BELLADONNA ALKALOIDS-OPIUM 16.2-60 MG RE SUPP
RECTAL | Status: AC
  Filled 2021-01-01: qty 1

## 2021-01-01 MED ORDER — ROCURONIUM BROMIDE 100 MG/10ML IV SOLN
INTRAVENOUS | Status: DC | PRN
  Administered 2021-01-01: 20 mg via INTRAVENOUS
  Administered 2021-01-01: 40 mg via INTRAVENOUS
  Administered 2021-01-01: 20 mg via INTRAVENOUS
  Administered 2021-01-01: 10 mg via INTRAVENOUS

## 2021-01-01 MED ORDER — FAMOTIDINE 20 MG PO TABS
ORAL_TABLET | ORAL | Status: AC
  Administered 2021-01-01: 20 mg via ORAL
  Filled 2021-01-01: qty 1

## 2021-01-01 MED ORDER — DEXMEDETOMIDINE HCL 200 MCG/2ML IV SOLN
INTRAVENOUS | Status: DC | PRN
  Administered 2021-01-01: 8 ug via INTRAVENOUS
  Administered 2021-01-01: 12 ug via INTRAVENOUS

## 2021-01-01 MED ORDER — PROPOFOL 10 MG/ML IV BOLUS
INTRAVENOUS | Status: AC
  Filled 2021-01-01: qty 20

## 2021-01-01 MED ORDER — LIDOCAINE HCL (CARDIAC) PF 100 MG/5ML IV SOSY
PREFILLED_SYRINGE | INTRAVENOUS | Status: DC | PRN
  Administered 2021-01-01: 100 mg via INTRAVENOUS

## 2021-01-01 SURGICAL SUPPLY — 24 items
ADAPTER IRRIG TUBE 2 SPIKE SOL (ADAPTER) ×4 IMPLANT
ADPR TBG 2 SPK PMP STRL ASCP (ADAPTER) ×2
BAG DRN RND TRDRP ANRFLXCHMBR (UROLOGICAL SUPPLIES) ×1
BAG URINE DRAIN 2000ML AR STRL (UROLOGICAL SUPPLIES) ×2 IMPLANT
CATH FOLEY 2WAY  5CC 20FR SIL (CATHETERS) ×2
CATH FOLEY 2WAY 5CC 20FR SIL (CATHETERS) IMPLANT
GLOVE BIO SURGEON STRL SZ7.5 (GLOVE) ×2 IMPLANT
GOWN STRL REUS W/ TWL LRG LVL3 (GOWN DISPOSABLE) ×1 IMPLANT
GOWN STRL REUS W/ TWL XL LVL3 (GOWN DISPOSABLE) ×1 IMPLANT
GOWN STRL REUS W/TWL LRG LVL3 (GOWN DISPOSABLE) ×2
GOWN STRL REUS W/TWL XL LVL3 (GOWN DISPOSABLE) ×2
HOLDER FOLEY CATH W/STRAP (MISCELLANEOUS) ×1 IMPLANT
IV NS 1000ML (IV SOLUTION) ×2
IV NS 1000ML BAXH (IV SOLUTION) ×1 IMPLANT
IV SET PRIMARY 15D 139IN B9900 (IV SETS) ×2 IMPLANT
KIT TURNOVER CYSTO (KITS) ×2 IMPLANT
PACK CYSTO AR (MISCELLANEOUS) ×2 IMPLANT
SET IRRIG Y TYPE TUR BLADDER L (SET/KITS/TRAYS/PACK) ×2 IMPLANT
SOL .9 NS 3000ML IRR  AL (IV SOLUTION) ×12
SOL .9 NS 3000ML IRR UROMATIC (IV SOLUTION) ×4 IMPLANT
SURGILUBE 2OZ TUBE FLIPTOP (MISCELLANEOUS) ×2 IMPLANT
SYR TOOMEY IRRIG 70ML (MISCELLANEOUS) ×2
SYRINGE TOOMEY IRRIG 70ML (MISCELLANEOUS) ×1 IMPLANT
WATER STERILE IRR 1000ML POUR (IV SOLUTION) ×2 IMPLANT

## 2021-01-01 NOTE — Anesthesia Preprocedure Evaluation (Signed)
Anesthesia Evaluation  Patient identified by MRN, date of birth, ID band Patient awake    Reviewed: Allergy & Precautions, H&P , NPO status , Patient's Chart, lab work & pertinent test results  History of Anesthesia Complications Negative for: history of anesthetic complications  Airway Mallampati: III  TM Distance: >3 FB Neck ROM: limited    Dental  (+) Poor Dentition, Chipped   Pulmonary neg shortness of breath, former smoker,    Pulmonary exam normal breath sounds clear to auscultation       Cardiovascular Exercise Tolerance: Good hypertension, (-) angina+ CAD, + Past MI and + Cardiac Stents  (-) DOE Normal cardiovascular exam Rhythm:regular Rate:Normal     Neuro/Psych  Headaches, negative psych ROS   GI/Hepatic Neg liver ROS, GERD  Controlled,  Endo/Other  Hypothyroidism   Renal/GU negative Renal ROS  negative genitourinary   Musculoskeletal  (+) Arthritis ,   Abdominal   Peds  Hematology negative hematology ROS (+)   Anesthesia Other Findings Past Medical History:   Hypertension                                                 Hypothyroidism                                               Headache(784.0)                                                Comment:frequent sinus headache   Cancer (HCC)                                                   Comment:hx thyroid cancer with iodine tx   Arthritis                                                    BPH (benign prostatic hypertrophy)                           Enlarged prostate                                            GERD (gastroesophageal reflux disease)                       Hurthle cell tumor                                           Hx of thyroid cancer  Hyperlipidemia                                               Retina hole                                                  Senile nuclear sclerosis                                      Thyroid disease                                              Valvular heart disease                                      Past Surgical History:   KNEE ARTHROSCOPY                                Left                Comment:Medial and lateral meniscectomy, partial   TOTAL THYROIDECTOMY                                           BACK SURGERY                                                  LUMBAR LAMINECTOMY                               08/16/2012      Comment:L 3 4 & 5   ANGIOPLASTY / STENTING FEMORAL                                TURP VAPORIZATION                                             LUMBAR MICRODISCECTOMY                                          Comment:L3-L4; with partial hemilaminectomy, partial               facetectomy and foraminotomy  BMI    Body Mass Index   30.03 kg/m 2      Reproductive/Obstetrics negative OB ROS  Anesthesia Physical  Anesthesia Plan  ASA: III  Anesthesia Plan: General   Post-op Pain Management:    Induction:   PONV Risk Score and Plan:   Airway Management Planned: Oral ETT  Additional Equipment:   Intra-op Plan:   Post-operative Plan: Extubation in OR  Informed Consent: I have reviewed the patients History and Physical, chart, labs and discussed the procedure including the risks, benefits and alternatives for the proposed anesthesia with the patient or authorized representative who has indicated his/her understanding and acceptance.     Dental Advisory Given  Plan Discussed with: Anesthesiologist, CRNA and Surgeon  Anesthesia Plan Comments:         Anesthesia Quick Evaluation

## 2021-01-01 NOTE — H&P (Signed)
Date of Initial H&P: 12/22/20   History reviewed, patient examined, no change in status, stable for surgery.

## 2021-01-01 NOTE — Anesthesia Procedure Notes (Signed)
Procedure Name: Intubation Date/Time: 01/01/2021 7:39 AM Performed by: Lia Foyer, CRNA Pre-anesthesia Checklist: Patient identified, Emergency Drugs available, Suction available and Patient being monitored Patient Re-evaluated:Patient Re-evaluated prior to induction Oxygen Delivery Method: Circle system utilized Preoxygenation: Pre-oxygenation with 100% oxygen Induction Type: IV induction Ventilation: Mask ventilation without difficulty Laryngoscope Size: McGraph and 4 Grade View: Grade I Tube type: Oral Tube size: 7.5 mm Number of attempts: 1 Airway Equipment and Method: Stylet and Video-laryngoscopy Placement Confirmation: ETT inserted through vocal cords under direct vision,  positive ETCO2 and breath sounds checked- equal and bilateral Secured at: 22 cm Tube secured with: Tape Dental Injury: Teeth and Oropharynx as per pre-operative assessment

## 2021-01-01 NOTE — Discharge Instructions (Addendum)
AMBULATORY SURGERY  DISCHARGE INSTRUCTIONS   1) The drugs that you were given will stay in your system until tomorrow so for the next 24 hours you should not:  A) Drive an automobile B) Make any legal decisions C) Drink any alcoholic beverage   2) You may resume regular meals tomorrow.  Today it is better to start with liquids and gradually work up to solid foods.  You may eat anything you prefer, but it is better to start with liquids, then soup and crackers, and gradually work up to solid foods.   3) Please notify your doctor immediately if you have any unusual bleeding, trouble breathing, redness and pain at the surgery site, drainage, fever, or pain not relieved by medication.    4) Additional Instructions:        Please contact your physician with any problems or Same Day Surgery at [phone], Monday through Friday 6 am to 4 pm, or [place] at [facility] number at [phone].[facility] Laser Prostate Treatment  Green light laser therapy is a procedure that uses a high-energy laser to get rid of extra prostate tissue by turning the tissue into a vapor. It is less invasive than traditional methods of prostate surgery, which involve cutting out the prostate tissue. Because the tissue is turned into a vapor (vaporized) rather than cut out, there is generally less blood loss. This surgery is used to treat an enlarged prostate gland (benign prostatic hyperplasia). Tell a health care provider about:  Any allergies you have.  All medicines you are taking, including vitamins, herbs, eye drops, creams, and over-the-counter medicines.  Any problems you or family members have had with anesthetic medicines.  Any blood disorders you have.  Any surgeries you have had.  Any medical conditions you have. What are the risks? Generally, this is a safe procedure. However, problems may occur, including:  Infection.  Bleeding.  Allergic reaction to medicines.  Damage  to surrounding structures or organs.  Blood in the urine (hematuria).  Painful urination.  Urinary tract infection.  Erectile dysfunction (rare).  Dry ejaculation.  Scar tissue in the urinary passage. What happens before the procedure? Staying hydrated Follow instructions from your health care provider about hydration, which may include:  Up to 2 hours before the procedure - you may continue to drink clear liquids, such as water, clear fruit juice, black coffee, and plain tea. Eating and drinking restrictions Follow instructions from your health care provider about eating and drinking, which may include:  8 hours before the procedure - stop eating heavy meals or foods such as meat, fried foods, or fatty foods.  6 hours before the procedure - stop eating light meals or foods, such as toast or cereal.  6 hours before the procedure - stop drinking milk or drinks that contain milk.  2 hours before the procedure - stop drinking clear liquids. Medicines  Ask your health care provider about: ? Changing or stopping your regular medicines. This is especially important if you are taking diabetes medicines or blood thinners. ? Taking medicines such as aspirin and ibuprofen. These medicines can thin your blood. Do not take these medicines before your procedure unless your health care provider instructs you to do so.  You may be prescribed antibiotic medicine. If so, take your antibiotic as told by your health care provider. Do not stop taking the antibiotic even if you start to feel better. General instructions  Plan to have a responsible adult take you home from the  hospital or clinic.  If you will be going home right after the procedure, plan to have a responsible adult with you for 24 hours. This is important. What happens during the procedure?  To reduce your risk of infection: ? Your health care team will wash or sanitize their hands. ? Your skin will be washed with soap.  An  IV will be inserted into one of your veins.  You will be given one or more of the following: ? A medicine to help you relax (sedative). ? A medicine to make you fall asleep (general anesthetic). ? A medicine that is injected into your spine to numb the area below and slightly above the injection site (spinal anesthetic).  A tube containing viewing scopes and instruments (fiber-optic scope) will be passed through the urethra and into the prostate. The opening of the urethra is at the end of the penis.  A thin fiber will be put through the tube and positioned next to the extra prostate tissue.  Pulses of laser light will come from the end of the fiber and be projected onto the extra tissue. Your blood will absorb the light, become hot, and vaporize the extra prostate tissue.  The heat from the laser beam will seal off the blood vessels, which will lessen bleeding.  The fiber-optic scope will be removed.  A thin tube will be inserted into the urethra to drain your urine (urinary catheter). The procedure may vary among health care providers and hospitals. What happens after the procedure?  Your blood pressure, heart rate, breathing rate, and blood oxygen level will be monitored until the medicines you were given have worn off.  Depending on factors such as the amount of prostate tissue that was vaporized, the strength of your bladder, and the amount of bleeding expected, your catheter may be removed before you go home.  You may be given elastic support stockings to wear to help prevent blood clots in your legs.  Do not drive for 24 hours if you were given a sedative, or for as long as directed by your health care provider. Summary  Green light laser therapy is a procedure that uses a high-energy laser that turns extra prostate tissue into a vapor.  This procedure is less invasive than traditional methods of prostate surgery.  Follow instructions from your health care provider about  eating and drinking before the procedure.  Plan to have a responsible adult take you home from the hospital or clinic. This information is not intended to replace advice given to you by your health care provider. Make sure you discuss any questions you have with your health care provider. Document Revised: 08/07/2020 Document Reviewed: 08/07/2020 Elsevier Patient Education  2021 [place] Prostate Treatment, Care After This sheet gives you information about how to care for yourself after your procedure. Your health care provider may also give you more specific instructions. If you have problems or questions, contact your health care provider. What can I expect after the procedure? After the procedure, it is common to have:  Swelling and discomfort around your urethra. The opening of the urethra is at the end of the penis.  Blood in your urine. This should go away after a few days.  Trouble urinating or sudden need to urinate (urgency). These problems should get better over time. You may continue to have a thin tube (catheter) inserted into your urethra to help drain your urine from your bladder for a few  days after the procedure. Follow these instructions at home: Medicines  Take over-the-counter and prescription medicines only as told by your health care provider.  If you were prescribed an antibiotic medicine, take it as told by your health care provider. Do not stop taking the antibiotic even if you start to feel better. Bathing  Do not take baths, swim, or use a hot tub until your health care provider approves. Ask your health care provider if you may take showers. You may only be allowed to take sponge baths. Activity  Do not drive for 24 hours if you were given a medicine to help you relax (sedative) during your procedure.  Do not drive or use heavy machinery while taking prescription pain medicine.  Ask your health care provider what activities are  safe for you. Most people can return to normal activities within a few days. ? Do not have sex or engage in sexual activity until your health care provider approves. ? Do not lift anything that is heavier than 10 lb (4.5 kg), or the limit that you are told, until your health care provider says that it is safe.   General instructions  If you have a urinary catheter, care for it as told by your health care provider. This may include: ? Washing your hands before and after touching the catheter. ? Emptying your drainage bag when it is ?- full, or emptying it at least 2-3 times a day. ? Keeping the area around the catheter clean and dry. ? Avoiding any bends or breaks in the catheter. ? Keeping air out of the catheter. ? Making sure that the catheter is not placed under water.  Do not use any products that contain nicotine or tobacco, such as cigarettes and e-cigarettes. If you need help quitting, ask your health care provider.  Drink enough fluid to keep your urine pale yellow.  Keep all follow-up visits as told by your health care provider. This is important.  If you will be going home right after the procedure, plan to have a responsible adult with you for 24 hours.      Contact a health care provider if:  You have trouble: ? Having a bowel movement. ? Getting an erection.  You have swelling around your urethra and it gets worse.  You have blood in your urine for more than 2 days after the procedure.  You have pain or burning when you urinate, or other problems that do not go away or cause discomfort.  You have problems with your catheter or your catheter is blocked.  You have a fever.  You have nausea or you vomit.  You have swelling in your legs. Get help right away if:  Your urine has blood clots in it.  Your urine is dark red.  You cannot urinate after your catheter is removed.  You have blood in your stool.  You have severe pain that does not get better with  medicine.  You have shortness of breath. Summary  After the procedure, it is common to have swelling and discomfort around your urethra and blood in your urine for a few days.  Some men may have problems urinating after this procedure. These problems should go away after a few days. If you have pain or burning while urinating, contact your health care provider.  If you have a catheter after this procedure, care for it as told by your health care provider.  If you have severe pain, dark red urine, or  urine with blood clots, get medical help right away. This information is not intended to replace advice given to you by your health care provider. Make sure you discuss any questions you have with your health care provider. Document Revised: 08/07/2020 Document Reviewed: 08/07/2020 Elsevier Patient Education  [place].   Benign Prostatic Hyperplasia  Benign prostatic hyperplasia (BPH) is an enlarged prostate gland that is caused by the normal aging process and not by cancer. The prostate is a walnut-sized gland that is involved in the production of semen. It is located in front of the rectum and below the bladder. The bladder stores urine and the urethra is the tube that carries the urine out of the body. The prostate may get bigger as a man gets older. An enlarged prostate can press on the urethra. This can make it harder to pass urine. The build-up of urine in the bladder can cause infection. Back pressure and infection may progress to bladder damage and kidney (renal) failure. What are the causes? This condition is part of a normal aging process. However, not all men develop problems from this condition. If the prostate enlarges away from the urethra, urine flow will not be blocked. If it enlarges toward the urethra and compresses it, there will be problems passing urine. What increases the risk? This condition is more likely to develop in men over the age of 38 years. What are the  signs or symptoms? Symptoms of this condition include:  Getting up often during the night to urinate.  Needing to urinate frequently during the day.  Difficulty starting urine flow.  Decrease in size and strength of your urine stream.  Leaking (dribbling) after urinating.  Inability to pass urine. This needs immediate treatment.  Inability to completely empty your bladder.  Pain when you pass urine. This is more common if there is also an infection.  Urinary tract infection (UTI). How is this diagnosed? This condition is diagnosed based on your medical history, a physical exam, and your symptoms. Tests will also be done, such as:  A post-void bladder scan. This measures any amount of urine that may remain in your bladder after you finish urinating.  A digital rectal exam. In a rectal exam, your health care provider checks your prostate by putting a lubricated, gloved finger into your rectum to feel the back of your prostate gland. This exam detects the size of your gland and any abnormal lumps or growths.  An exam of your urine (urinalysis).  A prostate specific antigen (PSA) screening. This is a blood test used to screen for prostate cancer.  An ultrasound. This test uses sound waves to electronically produce a picture of your prostate gland. Your health care provider may refer you to a specialist in kidney and prostate diseases (urologist). How is this treated? Once symptoms begin, your health care provider will monitor your condition (active surveillance or watchful waiting). Treatment for this condition will depend on the severity of your condition. Treatment may include:  Observation and yearly exams. This may be the only treatment needed if your condition and symptoms are mild.  Medicines to relieve your symptoms, including: ? Medicines to shrink the prostate. ? Medicines to relax the muscle of the prostate.  Surgery in severe cases. Surgery may  include: ? Prostatectomy. In this procedure, the prostate tissue is removed completely through an open incision or with a laparoscope or robotics. ? Transurethral resection of the prostate (TURP). In this procedure, a tool is inserted through  the opening at the tip of the penis (urethra). It is used to cut away tissue of the inner core of the prostate. The pieces are removed through the same opening of the penis. This removes the blockage. ? Transurethral incision (TUIP). In this procedure, small cuts are made in the prostate. This lessens the prostate's pressure on the urethra. ? Transurethral microwave thermotherapy (TUMT). This procedure uses microwaves to create heat. The heat destroys and removes a small amount of prostate tissue. ? Transurethral needle ablation (TUNA). This procedure uses radio frequencies to destroy and remove a small amount of prostate tissue. ? Interstitial laser coagulation ([place]). This procedure uses a laser to destroy and remove a small amount of prostate tissue. ? Transurethral electrovaporization (TUVP). This procedure uses electrodes to destroy and remove a small amount of prostate tissue. ? Prostatic urethral lift. This procedure inserts an implant to push the lobes of the prostate away from the urethra. Follow these instructions at home:  Take over-the-counter and prescription medicines only as told by your health care provider.  Monitor your symptoms for any changes. Contact your health care provider with any changes.  Avoid drinking large amounts of liquid before going to bed or out in public.  Avoid or reduce how much caffeine or alcohol you drink.  Give yourself time when you urinate.  Keep all follow-up visits as told by your health care provider. This is important. Contact a health care provider if:  You have unexplained back pain.  Your symptoms do not get better with treatment.  You develop side effects from the medicine you are taking.  Your  urine becomes very dark or has a bad smell.  Your lower abdomen becomes distended and you have trouble passing your urine. Get help right away if:  You have a fever or chills.  You suddenly cannot urinate.  You feel lightheaded, or very dizzy, or you faint.  There are large amounts of blood or clots in the urine.  Your urinary problems become hard to manage.  You develop moderate to severe low back or flank pain. The flank is the side of your body between the ribs and the hip. These symptoms may represent a serious problem that is an emergency. Do not wait to see if the symptoms will go away. Get medical help right away. Call your local emergency services (911 in the [place] Do not drive yourself to the hospital. Summary  Benign prostatic hyperplasia (BPH) is an enlarged prostate that is caused by the normal aging process and not by cancer.  An enlarged prostate can press on the urethra. This can make it hard to pass urine.  This condition is part of a normal aging process and is more likely to develop in men over the age of 68 years.  Get help right away if you suddenly cannot urinate. This information is not intended to replace advice given to you by your health care provider. Make sure you discuss any questions you have with your health care provider. Document Revised: 08/07/2020 Document Reviewed: 08/07/2020 Elsevier Patient Education  2021 [place], Adult An indwelling urinary catheter is a thin tube that is put into your bladder. The tube helps to drain pee (urine) out of your body. The tube goes in through your urethra. Your urethra is where pee comes out of your body. Your pee will come out through the catheter, then it will go into a bag (drainage bag). Take good care of your  catheter so it will work well. How to wear your catheter and bag Supplies needed  Sticky tape (adhesive tape) or a leg strap.  Alcohol wipe or soap and water  (if you use tape).  A clean towel (if you use tape).  Large overnight bag.  Smaller bag (leg bag). Wearing your catheter Attach your catheter to your leg with tape or a leg strap.  Make sure the catheter is not pulled tight.  If a leg strap gets wet, take it off and put on a dry strap.  If you use tape to hold the bag on your leg: 1. Use an alcohol wipe or soap and water to wash your skin where the tape made it sticky before. 2. Use a clean towel to pat-dry that skin. 3. Use new tape to make the bag stay on your leg. Wearing your bags You should have been given a large overnight bag.  You may wear the overnight bag in the day or night.  Always have the overnight bag lower than your bladder.  Do not let the bag touch the floor.  Before you go to sleep, put a clean plastic bag in a wastebasket. Then hang the overnight bag inside the wastebasket. You should also have a smaller leg bag that fits under your clothes.  Always wear the leg bag below your knee.  Do not wear your leg bag at night. How to care for your skin and catheter Supplies needed  A clean washcloth.  Water and mild soap.  A clean towel. Caring for your skin and catheter  Clean the skin around your catheter every day: 1. Wash your hands with soap and water. 2. Wet a clean washcloth in warm water and mild soap. 3. Clean the skin around your urethra.  If you are male:  Gently spread the folds of skin around your vagina (labia).  With the washcloth in your other hand, wipe the inner side of your labia on each side. Wipe from front to back.  If you are male:  Pull back any skin that covers the end of your penis (foreskin).  With the washcloth in your other hand, wipe your penis in small circles. Start wiping at the tip of your penis, then move away from the catheter.  Move the foreskin back in place, if needed. 4. With your free hand, hold the catheter close to where it goes into your body.  Keep  holding the catheter during cleaning so it does not get pulled out. 5. With the washcloth in your other hand, clean the catheter.  Only wipe downward on the catheter.  Do not wipe upward toward your body. Doing this may push germs into your urethra and cause infection. 6. Use a clean towel to pat-dry the catheter and the skin around it. Make sure to wipe off all soap. 7. Wash your hands with soap and water.  Shower every day. Do not take baths.  Do not use cream, ointment, or lotion on the area where the catheter goes into your body, unless your doctor tells you to.  Do not use powders, sprays, or lotions on your genital area.  Check your skin around the catheter every day for signs of infection. Check for: ? Redness, swelling, or pain. ? Fluid or blood. ? Warmth. ? Pus or a bad smell.      How to empty the bag Supplies needed  Rubbing alcohol.  Gauze pad or cotton ball.  Tape or a leg strap.  Emptying the bag Pour the pee out of your bag when it is ?- full, or at least 2-3 times a day. Do this for your overnight bag and your leg bag. 1. Wash your hands with soap and water. 2. Separate (detach) the bag from your leg. 3. Hold the bag over the toilet or a clean pail. Keep the bag lower than your hips and bladder. This is so the pee (urine) does not go back into the tube. 4. Open the pour spout. It is at the bottom of the bag. 5. Empty the pee into the toilet or pail. Do not let the pour spout touch any surface. 6. Put rubbing alcohol on a gauze pad or cotton ball. 7. Use the gauze pad or cotton ball to clean the pour spout. 8. Close the pour spout. 9. Attach the bag to your leg with tape or a leg strap. 10. Wash your hands with soap and water. Follow instructions for cleaning the drainage bag:  From the product maker.  As told by your doctor. How to change the bag Supplies needed  Alcohol wipes.  A clean bag.  Tape or a leg strap. Changing the bag Replace your  bag when it starts to leak, smell bad, or look dirty. 1. Wash your hands with soap and water. 2. Separate the dirty bag from your leg. 3. Pinch the catheter with your fingers so that pee does not spill out. 4. Separate the catheter tube from the bag tube where these tubes connect (at the connection valve). Do not let the tubes touch any surface. 5. Clean the end of the catheter tube with an alcohol wipe. Use a different alcohol wipe to clean the end of the bag tube. 6. Connect the catheter tube to the tube of the clean bag. 7. Attach the clean bag to your leg with tape or a leg strap. Do not make the bag tight on your leg. 8. Wash your hands with soap and water. General rules  Never pull on your catheter. Never try to take it out. Doing that can hurt you.  Always wash your hands before and after you touch your catheter or bag. Use a mild, fragrance-free soap. If you do not have soap and water, use hand sanitizer.  Always make sure there are no twists or bends (kinks) in the catheter tube.  Always make sure there are no leaks in the catheter or bag.  Drink enough fluid to keep your pee pale yellow.  Do not take baths, swim, or use a hot tub.  If you are male, wipe from front to back after you poop (have a bowel movement).   Contact a doctor if:  Your pee is cloudy.  Your pee smells worse than usual.  Your catheter gets clogged.  Your catheter leaks.  Your bladder feels full. Get help right away if:  You have redness, swelling, or pain where the catheter goes into your body.  You have fluid, blood, pus, or a bad smell coming from the area where the catheter goes into your body.  Your skin feels warm where the catheter goes into your body.  You have a fever.  You have pain in your: ? Belly (abdomen). ? Legs. ? Lower back. ? Bladder.  You see blood in the catheter.  Your pee is pink or red.  You feel sick to your stomach (nauseous).  You throw up (vomit).  You  have chills.  Your pee is not draining into the  bag.  Your catheter gets pulled out. Summary  An indwelling urinary catheter is a thin tube that is placed into the bladder to help drain pee (urine) out of the body.  The catheter is placed into the part of the body that drains pee from the bladder (urethra).  Taking good care of your catheter will keep it working properly and help prevent problems.  Always wash your hands before and after touching your catheter or bag.  Never pull on your catheter or try to take it out. This information is not intended to replace advice given to you by your health care provider. Make sure you discuss any questions you have with your health care provider. Document Revised: 03/23/2019 Document Reviewed: 07/15/2017 Elsevier Patient Education  [place].

## 2021-01-01 NOTE — Op Note (Signed)
Preoperative diagnosis: BPH with bladder outlet obstruction (N40.1)  Postoperative diagnosis: Same  Procedure: Photo vaporization of the prostate with greenlight laser (CPT [phone])  Surgeon: Otelia Limes. Yves Dill MD  Anesthesia: General  Indications:See the history and physical. After informed consent the above procedure(s) were requested     Technique and findings: After adequate general anesthesia been obtained the patient was placed into dorsal lithotomy position and the perineum was prepped and draped in the usual fashion.  The laser scope was coupled to the camera and visually advanced into the bladder.  The bladder was thoroughly inspected and no lesions were identified.  Both ureteral orifices were identified and had clear efflux.  Trilobar BPH was encountered with obstructing median lobe posteriorly and obstructing lateral lobes.  The greenlight XPS laser fiber was introduced through the scope and set at 76 W.  The bladder neck tissue and median lobe were vaporized.  The power was then increased up to 120 W and remaining obstructive tissue from the bladder neck to the verumontanum was vaporized.  Bleeders were controlled with the coagulative setting.  The scope was then removed and 10 cc of viscous Xylocaine instilled within the urethra and the bladder.  A 20 French silicone Foley catheter was placed and irrigated until clear.  The procedure was then terminated.  Blood loss was minimal.  Patient was transferred to the recovery room in stable condition.

## 2021-01-01 NOTE — Transfer of Care (Signed)
Immediate Anesthesia Transfer of Care Note  Patient: Keith Williamson  Procedure(s) Performed: GREEN LIGHT LASER TURP (TRANSURETHRAL RESECTION OF PROSTATE (N/A )  Patient Location: PACU  Anesthesia Type:General  Level of Consciousness: drowsy and patient cooperative  Airway & Oxygen Therapy: Patient Spontanous Breathing and Patient connected to face mask oxygen  Post-op Assessment: Report given to RN and Post -op Vital signs reviewed and stable  Post vital signs: Reviewed and stable  Last Vitals: see EPIC Vitals Value Taken Time  BP    Temp    Pulse    Resp    SpO2      Last Pain:  Vitals:   01/01/21 0640  TempSrc: Oral  PainSc: 0-No pain         Complications: No complications documented.

## 2021-01-02 NOTE — Anesthesia Postprocedure Evaluation (Signed)
Anesthesia Post Note  Patient: Keith Williamson  Procedure(s) Performed: GREEN LIGHT LASER TURP (TRANSURETHRAL RESECTION OF PROSTATE (N/A )  Patient location during evaluation: PACU Anesthesia Type: General Level of consciousness: awake and alert Pain management: pain level controlled Vital Signs Assessment: post-procedure vital signs reviewed and stable Respiratory status: spontaneous breathing, nonlabored ventilation and respiratory function stable Cardiovascular status: blood pressure returned to baseline and stable Postop Assessment: no apparent nausea or vomiting Anesthetic complications: no   No complications documented.   Last Vitals:  Vitals:   01/01/21 0945 01/01/21 1006  BP: (!) 165/99 (!) 142/73  Pulse: 69 66  Resp: 12 16  Temp: (!) 36.2 C 36.6 C  SpO2: 98% 98%    Last Pain:  Vitals:   01/01/21 1006  TempSrc: Temporal  PainSc: 3                  Tera Mater

## 2021-01-14 DIAGNOSIS — R310 Gross hematuria: Secondary | ICD-10-CM | POA: Diagnosis not present

## 2021-01-14 DIAGNOSIS — R972 Elevated prostate specific antigen [PSA]: Secondary | ICD-10-CM | POA: Diagnosis not present

## 2021-01-14 DIAGNOSIS — N401 Enlarged prostate with lower urinary tract symptoms: Secondary | ICD-10-CM | POA: Diagnosis not present

## 2021-01-19 DIAGNOSIS — H903 Sensorineural hearing loss, bilateral: Secondary | ICD-10-CM | POA: Diagnosis not present

## 2021-01-23 DIAGNOSIS — H903 Sensorineural hearing loss, bilateral: Secondary | ICD-10-CM | POA: Diagnosis not present

## 2021-02-04 DIAGNOSIS — M25562 Pain in left knee: Secondary | ICD-10-CM | POA: Diagnosis not present

## 2021-02-04 DIAGNOSIS — Z955 Presence of coronary angioplasty implant and graft: Secondary | ICD-10-CM | POA: Diagnosis not present

## 2021-02-04 DIAGNOSIS — Z8739 Personal history of other diseases of the musculoskeletal system and connective tissue: Secondary | ICD-10-CM | POA: Diagnosis not present

## 2021-02-04 DIAGNOSIS — M5442 Lumbago with sciatica, left side: Secondary | ICD-10-CM | POA: Diagnosis not present

## 2021-02-04 DIAGNOSIS — E782 Mixed hyperlipidemia: Secondary | ICD-10-CM | POA: Diagnosis not present

## 2021-02-04 DIAGNOSIS — R8290 Unspecified abnormal findings in urine: Secondary | ICD-10-CM | POA: Diagnosis not present

## 2021-02-04 DIAGNOSIS — I10 Essential (primary) hypertension: Secondary | ICD-10-CM | POA: Diagnosis not present

## 2021-02-04 DIAGNOSIS — Z85850 Personal history of malignant neoplasm of thyroid: Secondary | ICD-10-CM | POA: Diagnosis not present

## 2021-02-04 DIAGNOSIS — I2510 Atherosclerotic heart disease of native coronary artery without angina pectoris: Secondary | ICD-10-CM | POA: Diagnosis not present

## 2021-02-04 DIAGNOSIS — R7309 Other abnormal glucose: Secondary | ICD-10-CM | POA: Diagnosis not present

## 2021-02-04 DIAGNOSIS — E890 Postprocedural hypothyroidism: Secondary | ICD-10-CM | POA: Diagnosis not present

## 2021-02-04 DIAGNOSIS — M25561 Pain in right knee: Secondary | ICD-10-CM | POA: Diagnosis not present

## 2021-02-04 DIAGNOSIS — G8929 Other chronic pain: Secondary | ICD-10-CM | POA: Diagnosis not present

## 2021-02-04 DIAGNOSIS — Z0000 Encounter for general adult medical examination without abnormal findings: Secondary | ICD-10-CM | POA: Diagnosis not present

## 2021-02-05 DIAGNOSIS — H43813 Vitreous degeneration, bilateral: Secondary | ICD-10-CM | POA: Diagnosis not present

## 2021-02-11 DIAGNOSIS — Z85850 Personal history of malignant neoplasm of thyroid: Secondary | ICD-10-CM | POA: Diagnosis not present

## 2021-02-11 DIAGNOSIS — N390 Urinary tract infection, site not specified: Secondary | ICD-10-CM | POA: Diagnosis not present

## 2021-02-11 DIAGNOSIS — G8929 Other chronic pain: Secondary | ICD-10-CM | POA: Diagnosis not present

## 2021-02-11 DIAGNOSIS — I2510 Atherosclerotic heart disease of native coronary artery without angina pectoris: Secondary | ICD-10-CM | POA: Diagnosis not present

## 2021-02-11 DIAGNOSIS — K2100 Gastro-esophageal reflux disease with esophagitis, without bleeding: Secondary | ICD-10-CM | POA: Diagnosis not present

## 2021-02-11 DIAGNOSIS — Z9079 Acquired absence of other genital organ(s): Secondary | ICD-10-CM | POA: Diagnosis not present

## 2021-02-11 DIAGNOSIS — Z6832 Body mass index (BMI) 32.0-32.9, adult: Secondary | ICD-10-CM | POA: Diagnosis not present

## 2021-02-11 DIAGNOSIS — M25562 Pain in left knee: Secondary | ICD-10-CM | POA: Diagnosis not present

## 2021-02-11 DIAGNOSIS — R319 Hematuria, unspecified: Secondary | ICD-10-CM | POA: Diagnosis not present

## 2021-02-11 DIAGNOSIS — M25561 Pain in right knee: Secondary | ICD-10-CM | POA: Diagnosis not present

## 2021-02-11 DIAGNOSIS — Z955 Presence of coronary angioplasty implant and graft: Secondary | ICD-10-CM | POA: Diagnosis not present

## 2021-02-11 DIAGNOSIS — R7309 Other abnormal glucose: Secondary | ICD-10-CM | POA: Diagnosis not present

## 2021-02-18 DIAGNOSIS — Z85850 Personal history of malignant neoplasm of thyroid: Secondary | ICD-10-CM | POA: Diagnosis not present

## 2021-02-18 DIAGNOSIS — E890 Postprocedural hypothyroidism: Secondary | ICD-10-CM | POA: Diagnosis not present

## 2021-03-04 DIAGNOSIS — R319 Hematuria, unspecified: Secondary | ICD-10-CM | POA: Diagnosis not present

## 2021-03-04 DIAGNOSIS — Z9079 Acquired absence of other genital organ(s): Secondary | ICD-10-CM | POA: Diagnosis not present

## 2021-03-04 DIAGNOSIS — N390 Urinary tract infection, site not specified: Secondary | ICD-10-CM | POA: Diagnosis not present

## 2021-03-17 DIAGNOSIS — G629 Polyneuropathy, unspecified: Secondary | ICD-10-CM | POA: Diagnosis not present

## 2021-04-09 DIAGNOSIS — S32591A Other specified fracture of right pubis, initial encounter for closed fracture: Secondary | ICD-10-CM | POA: Diagnosis not present

## 2021-04-09 DIAGNOSIS — I129 Hypertensive chronic kidney disease with stage 1 through stage 4 chronic kidney disease, or unspecified chronic kidney disease: Secondary | ICD-10-CM | POA: Diagnosis not present

## 2021-04-09 DIAGNOSIS — Y33XXXD Other specified events, undetermined intent, subsequent encounter: Secondary | ICD-10-CM | POA: Diagnosis not present

## 2021-04-09 DIAGNOSIS — R41841 Cognitive communication deficit: Secondary | ICD-10-CM | POA: Diagnosis not present

## 2021-04-09 DIAGNOSIS — D649 Anemia, unspecified: Secondary | ICD-10-CM | POA: Diagnosis not present

## 2021-04-09 DIAGNOSIS — S199XXA Unspecified injury of neck, initial encounter: Secondary | ICD-10-CM | POA: Diagnosis not present

## 2021-04-09 DIAGNOSIS — E785 Hyperlipidemia, unspecified: Secondary | ICD-10-CM | POA: Diagnosis not present

## 2021-04-09 DIAGNOSIS — Z20822 Contact with and (suspected) exposure to covid-19: Secondary | ICD-10-CM | POA: Diagnosis not present

## 2021-04-09 DIAGNOSIS — S3992XA Unspecified injury of lower back, initial encounter: Secondary | ICD-10-CM | POA: Diagnosis not present

## 2021-04-09 DIAGNOSIS — S73004A Unspecified dislocation of right hip, initial encounter: Secondary | ICD-10-CM | POA: Diagnosis not present

## 2021-04-09 DIAGNOSIS — S8991XA Unspecified injury of right lower leg, initial encounter: Secondary | ICD-10-CM | POA: Diagnosis not present

## 2021-04-09 DIAGNOSIS — G9341 Metabolic encephalopathy: Secondary | ICD-10-CM | POA: Diagnosis not present

## 2021-04-09 DIAGNOSIS — S32431A Displaced fracture of anterior column [iliopubic] of right acetabulum, initial encounter for closed fracture: Secondary | ICD-10-CM | POA: Diagnosis not present

## 2021-04-09 DIAGNOSIS — Y33XXXA Other specified events, undetermined intent, initial encounter: Secondary | ICD-10-CM | POA: Diagnosis not present

## 2021-04-09 DIAGNOSIS — N390 Urinary tract infection, site not specified: Secondary | ICD-10-CM | POA: Diagnosis not present

## 2021-04-09 DIAGNOSIS — R0902 Hypoxemia: Secondary | ICD-10-CM | POA: Diagnosis not present

## 2021-04-09 DIAGNOSIS — R55 Syncope and collapse: Secondary | ICD-10-CM | POA: Diagnosis not present

## 2021-04-09 DIAGNOSIS — S065X9A Traumatic subdural hemorrhage with loss of consciousness of unspecified duration, initial encounter: Secondary | ICD-10-CM | POA: Diagnosis not present

## 2021-04-09 DIAGNOSIS — Z4682 Encounter for fitting and adjustment of non-vascular catheter: Secondary | ICD-10-CM | POA: Diagnosis not present

## 2021-04-09 DIAGNOSIS — S32461A Displaced associated transverse-posterior fracture of right acetabulum, initial encounter for closed fracture: Secondary | ICD-10-CM | POA: Diagnosis not present

## 2021-04-09 DIAGNOSIS — Z85850 Personal history of malignant neoplasm of thyroid: Secondary | ICD-10-CM | POA: Diagnosis not present

## 2021-04-09 DIAGNOSIS — M5136 Other intervertebral disc degeneration, lumbar region: Secondary | ICD-10-CM | POA: Diagnosis not present

## 2021-04-09 DIAGNOSIS — R000 Tachycardia, unspecified: Secondary | ICD-10-CM | POA: Diagnosis not present

## 2021-04-09 DIAGNOSIS — S6992XA Unspecified injury of left wrist, hand and finger(s), initial encounter: Secondary | ICD-10-CM | POA: Diagnosis not present

## 2021-04-09 DIAGNOSIS — N182 Chronic kidney disease, stage 2 (mild): Secondary | ICD-10-CM | POA: Diagnosis not present

## 2021-04-09 DIAGNOSIS — S065X0A Traumatic subdural hemorrhage without loss of consciousness, initial encounter: Secondary | ICD-10-CM | POA: Diagnosis not present

## 2021-04-09 DIAGNOSIS — T797XXA Traumatic subcutaneous emphysema, initial encounter: Secondary | ICD-10-CM | POA: Diagnosis not present

## 2021-04-09 DIAGNOSIS — H9193 Unspecified hearing loss, bilateral: Secondary | ICD-10-CM | POA: Diagnosis not present

## 2021-04-09 DIAGNOSIS — S32401A Unspecified fracture of right acetabulum, initial encounter for closed fracture: Secondary | ICD-10-CM | POA: Diagnosis not present

## 2021-04-09 DIAGNOSIS — T1490XA Injury, unspecified, initial encounter: Secondary | ICD-10-CM | POA: Diagnosis not present

## 2021-04-09 DIAGNOSIS — M1712 Unilateral primary osteoarthritis, left knee: Secondary | ICD-10-CM | POA: Diagnosis not present

## 2021-04-09 DIAGNOSIS — Z888 Allergy status to other drugs, medicaments and biological substances status: Secondary | ICD-10-CM | POA: Diagnosis not present

## 2021-04-09 DIAGNOSIS — K219 Gastro-esophageal reflux disease without esophagitis: Secondary | ICD-10-CM | POA: Diagnosis not present

## 2021-04-09 DIAGNOSIS — R918 Other nonspecific abnormal finding of lung field: Secondary | ICD-10-CM | POA: Diagnosis not present

## 2021-04-09 DIAGNOSIS — S01411A Laceration without foreign body of right cheek and temporomandibular area, initial encounter: Secondary | ICD-10-CM | POA: Diagnosis not present

## 2021-04-09 DIAGNOSIS — R143 Flatulence: Secondary | ICD-10-CM | POA: Diagnosis not present

## 2021-04-09 DIAGNOSIS — N400 Enlarged prostate without lower urinary tract symptoms: Secondary | ICD-10-CM | POA: Diagnosis not present

## 2021-04-09 DIAGNOSIS — I2510 Atherosclerotic heart disease of native coronary artery without angina pectoris: Secondary | ICD-10-CM | POA: Diagnosis not present

## 2021-04-09 DIAGNOSIS — R58 Hemorrhage, not elsewhere classified: Secondary | ICD-10-CM | POA: Diagnosis not present

## 2021-04-09 DIAGNOSIS — S299XXA Unspecified injury of thorax, initial encounter: Secondary | ICD-10-CM | POA: Diagnosis not present

## 2021-04-09 DIAGNOSIS — S0240EB Zygomatic fracture, right side, initial encounter for open fracture: Secondary | ICD-10-CM | POA: Diagnosis not present

## 2021-04-09 DIAGNOSIS — S0181XA Laceration without foreign body of other part of head, initial encounter: Secondary | ICD-10-CM | POA: Diagnosis not present

## 2021-04-09 DIAGNOSIS — M1711 Unilateral primary osteoarthritis, right knee: Secondary | ICD-10-CM | POA: Diagnosis not present

## 2021-04-09 DIAGNOSIS — S0240EA Zygomatic fracture, right side, initial encounter for closed fracture: Secondary | ICD-10-CM | POA: Diagnosis not present

## 2021-04-09 DIAGNOSIS — S73034A Other anterior dislocation of right hip, initial encounter: Secondary | ICD-10-CM | POA: Diagnosis not present

## 2021-04-09 DIAGNOSIS — R0689 Other abnormalities of breathing: Secondary | ICD-10-CM | POA: Diagnosis not present

## 2021-04-09 DIAGNOSIS — S0003XA Contusion of scalp, initial encounter: Secondary | ICD-10-CM | POA: Diagnosis not present

## 2021-04-09 DIAGNOSIS — S32401D Unspecified fracture of right acetabulum, subsequent encounter for fracture with routine healing: Secondary | ICD-10-CM | POA: Diagnosis not present

## 2021-04-09 DIAGNOSIS — J9811 Atelectasis: Secondary | ICD-10-CM | POA: Diagnosis not present

## 2021-04-09 DIAGNOSIS — Z87891 Personal history of nicotine dependence: Secondary | ICD-10-CM | POA: Diagnosis not present

## 2021-04-09 DIAGNOSIS — R531 Weakness: Secondary | ICD-10-CM | POA: Diagnosis not present

## 2021-04-09 DIAGNOSIS — Z955 Presence of coronary angioplasty implant and graft: Secondary | ICD-10-CM | POA: Diagnosis not present

## 2021-04-09 DIAGNOSIS — I252 Old myocardial infarction: Secondary | ICD-10-CM | POA: Diagnosis not present

## 2021-04-09 DIAGNOSIS — N179 Acute kidney failure, unspecified: Secondary | ICD-10-CM | POA: Diagnosis not present

## 2021-04-09 DIAGNOSIS — M25462 Effusion, left knee: Secondary | ICD-10-CM | POA: Diagnosis not present

## 2021-04-09 DIAGNOSIS — M47812 Spondylosis without myelopathy or radiculopathy, cervical region: Secondary | ICD-10-CM | POA: Diagnosis not present

## 2021-04-09 DIAGNOSIS — Z743 Need for continuous supervision: Secondary | ICD-10-CM | POA: Diagnosis not present

## 2021-04-09 DIAGNOSIS — K661 Hemoperitoneum: Secondary | ICD-10-CM | POA: Diagnosis not present

## 2021-04-10 DIAGNOSIS — S199XXA Unspecified injury of neck, initial encounter: Secondary | ICD-10-CM | POA: Diagnosis not present

## 2021-04-10 DIAGNOSIS — S0240EB Zygomatic fracture, right side, initial encounter for open fracture: Secondary | ICD-10-CM | POA: Diagnosis not present

## 2021-04-10 DIAGNOSIS — S065X0A Traumatic subdural hemorrhage without loss of consciousness, initial encounter: Secondary | ICD-10-CM | POA: Diagnosis not present

## 2021-04-10 DIAGNOSIS — T797XXA Traumatic subcutaneous emphysema, initial encounter: Secondary | ICD-10-CM | POA: Diagnosis not present

## 2021-04-10 DIAGNOSIS — M47812 Spondylosis without myelopathy or radiculopathy, cervical region: Secondary | ICD-10-CM | POA: Diagnosis not present

## 2021-04-10 DIAGNOSIS — S73004A Unspecified dislocation of right hip, initial encounter: Secondary | ICD-10-CM | POA: Diagnosis not present

## 2021-04-10 DIAGNOSIS — S73034A Other anterior dislocation of right hip, initial encounter: Secondary | ICD-10-CM | POA: Diagnosis not present

## 2021-04-10 DIAGNOSIS — S0181XA Laceration without foreign body of other part of head, initial encounter: Secondary | ICD-10-CM | POA: Diagnosis not present

## 2021-04-10 DIAGNOSIS — S32461A Displaced associated transverse-posterior fracture of right acetabulum, initial encounter for closed fracture: Secondary | ICD-10-CM | POA: Diagnosis not present

## 2021-04-10 DIAGNOSIS — Y33XXXA Other specified events, undetermined intent, initial encounter: Secondary | ICD-10-CM | POA: Diagnosis not present

## 2021-04-10 DIAGNOSIS — S0003XA Contusion of scalp, initial encounter: Secondary | ICD-10-CM | POA: Diagnosis not present

## 2021-04-10 DIAGNOSIS — S299XXA Unspecified injury of thorax, initial encounter: Secondary | ICD-10-CM | POA: Diagnosis not present

## 2021-04-10 DIAGNOSIS — S32401A Unspecified fracture of right acetabulum, initial encounter for closed fracture: Secondary | ICD-10-CM | POA: Diagnosis not present

## 2021-04-10 DIAGNOSIS — S0240EA Zygomatic fracture, right side, initial encounter for closed fracture: Secondary | ICD-10-CM | POA: Diagnosis not present

## 2021-04-11 DIAGNOSIS — S32401D Unspecified fracture of right acetabulum, subsequent encounter for fracture with routine healing: Secondary | ICD-10-CM | POA: Diagnosis not present

## 2021-04-11 DIAGNOSIS — T1490XA Injury, unspecified, initial encounter: Secondary | ICD-10-CM | POA: Diagnosis not present

## 2021-04-11 DIAGNOSIS — S065X0A Traumatic subdural hemorrhage without loss of consciousness, initial encounter: Secondary | ICD-10-CM | POA: Diagnosis not present

## 2021-04-11 DIAGNOSIS — S0003XA Contusion of scalp, initial encounter: Secondary | ICD-10-CM | POA: Diagnosis not present

## 2021-04-11 DIAGNOSIS — S32461A Displaced associated transverse-posterior fracture of right acetabulum, initial encounter for closed fracture: Secondary | ICD-10-CM | POA: Diagnosis not present

## 2021-04-11 DIAGNOSIS — Z4682 Encounter for fitting and adjustment of non-vascular catheter: Secondary | ICD-10-CM | POA: Diagnosis not present

## 2021-04-11 DIAGNOSIS — R143 Flatulence: Secondary | ICD-10-CM | POA: Diagnosis not present

## 2021-04-11 DIAGNOSIS — Y33XXXD Other specified events, undetermined intent, subsequent encounter: Secondary | ICD-10-CM | POA: Diagnosis not present

## 2021-04-11 DIAGNOSIS — S0240EA Zygomatic fracture, right side, initial encounter for closed fracture: Secondary | ICD-10-CM | POA: Diagnosis not present

## 2021-04-11 DIAGNOSIS — K661 Hemoperitoneum: Secondary | ICD-10-CM | POA: Diagnosis not present

## 2021-04-13 DIAGNOSIS — T1490XA Injury, unspecified, initial encounter: Secondary | ICD-10-CM | POA: Diagnosis not present

## 2021-04-13 DIAGNOSIS — S32461A Displaced associated transverse-posterior fracture of right acetabulum, initial encounter for closed fracture: Secondary | ICD-10-CM | POA: Diagnosis not present

## 2021-04-16 DIAGNOSIS — R41841 Cognitive communication deficit: Secondary | ICD-10-CM | POA: Diagnosis not present

## 2021-04-17 DIAGNOSIS — R41841 Cognitive communication deficit: Secondary | ICD-10-CM | POA: Diagnosis not present

## 2021-04-18 DIAGNOSIS — R41841 Cognitive communication deficit: Secondary | ICD-10-CM | POA: Diagnosis not present

## 2021-04-19 DIAGNOSIS — T1490XA Injury, unspecified, initial encounter: Secondary | ICD-10-CM | POA: Diagnosis not present

## 2021-04-19 DIAGNOSIS — R918 Other nonspecific abnormal finding of lung field: Secondary | ICD-10-CM | POA: Diagnosis not present

## 2021-04-19 DIAGNOSIS — S32401A Unspecified fracture of right acetabulum, initial encounter for closed fracture: Secondary | ICD-10-CM | POA: Diagnosis not present

## 2021-04-19 DIAGNOSIS — J9811 Atelectasis: Secondary | ICD-10-CM | POA: Diagnosis not present

## 2021-04-19 DIAGNOSIS — S32461A Displaced associated transverse-posterior fracture of right acetabulum, initial encounter for closed fracture: Secondary | ICD-10-CM | POA: Diagnosis not present

## 2021-04-20 DIAGNOSIS — R41841 Cognitive communication deficit: Secondary | ICD-10-CM | POA: Diagnosis not present

## 2021-04-21 DIAGNOSIS — R41841 Cognitive communication deficit: Secondary | ICD-10-CM | POA: Diagnosis not present

## 2021-04-22 DIAGNOSIS — R41841 Cognitive communication deficit: Secondary | ICD-10-CM | POA: Diagnosis not present

## 2021-04-22 DIAGNOSIS — M1712 Unilateral primary osteoarthritis, left knee: Secondary | ICD-10-CM | POA: Diagnosis not present

## 2021-04-22 DIAGNOSIS — M25462 Effusion, left knee: Secondary | ICD-10-CM | POA: Diagnosis not present

## 2021-04-23 DIAGNOSIS — R41841 Cognitive communication deficit: Secondary | ICD-10-CM | POA: Diagnosis not present

## 2021-04-24 DIAGNOSIS — S32431A Displaced fracture of anterior column [iliopubic] of right acetabulum, initial encounter for closed fracture: Secondary | ICD-10-CM | POA: Diagnosis not present

## 2021-04-24 DIAGNOSIS — R531 Weakness: Secondary | ICD-10-CM | POA: Diagnosis not present

## 2021-04-24 DIAGNOSIS — R41841 Cognitive communication deficit: Secondary | ICD-10-CM | POA: Diagnosis not present

## 2021-04-24 DIAGNOSIS — Z743 Need for continuous supervision: Secondary | ICD-10-CM | POA: Diagnosis not present

## 2021-04-29 DIAGNOSIS — I129 Hypertensive chronic kidney disease with stage 1 through stage 4 chronic kidney disease, or unspecified chronic kidney disease: Secondary | ICD-10-CM | POA: Diagnosis not present

## 2021-04-29 DIAGNOSIS — S32461D Displaced associated transverse-posterior fracture of right acetabulum, subsequent encounter for fracture with routine healing: Secondary | ICD-10-CM | POA: Diagnosis not present

## 2021-04-29 DIAGNOSIS — S32401A Unspecified fracture of right acetabulum, initial encounter for closed fracture: Secondary | ICD-10-CM | POA: Diagnosis not present

## 2021-04-29 DIAGNOSIS — I2510 Atherosclerotic heart disease of native coronary artery without angina pectoris: Secondary | ICD-10-CM | POA: Diagnosis not present

## 2021-04-29 DIAGNOSIS — M7989 Other specified soft tissue disorders: Secondary | ICD-10-CM | POA: Diagnosis not present

## 2021-04-29 DIAGNOSIS — E785 Hyperlipidemia, unspecified: Secondary | ICD-10-CM | POA: Diagnosis not present

## 2021-04-29 DIAGNOSIS — N189 Chronic kidney disease, unspecified: Secondary | ICD-10-CM | POA: Diagnosis not present

## 2021-04-29 DIAGNOSIS — M79671 Pain in right foot: Secondary | ICD-10-CM | POA: Diagnosis not present

## 2021-04-29 DIAGNOSIS — E039 Hypothyroidism, unspecified: Secondary | ICD-10-CM | POA: Diagnosis not present

## 2021-04-30 ENCOUNTER — Other Ambulatory Visit: Payer: Self-pay

## 2021-04-30 ENCOUNTER — Emergency Department
Admission: EM | Admit: 2021-04-30 | Discharge: 2021-05-01 | Disposition: A | Discharge status: Home / Self Care | Payer: PPO | Attending: Emergency Medicine | Admitting: Emergency Medicine

## 2021-04-30 DIAGNOSIS — Z79899 Other long term (current) drug therapy: Secondary | ICD-10-CM | POA: Insufficient documentation

## 2021-04-30 DIAGNOSIS — Z9889 Other specified postprocedural states: Secondary | ICD-10-CM | POA: Insufficient documentation

## 2021-04-30 DIAGNOSIS — R2241 Localized swelling, mass and lump, right lower limb: Secondary | ICD-10-CM | POA: Diagnosis not present

## 2021-04-30 DIAGNOSIS — Z87891 Personal history of nicotine dependence: Secondary | ICD-10-CM | POA: Diagnosis not present

## 2021-04-30 DIAGNOSIS — Z7982 Long term (current) use of aspirin: Secondary | ICD-10-CM | POA: Insufficient documentation

## 2021-04-30 DIAGNOSIS — Z5189 Encounter for other specified aftercare: Secondary | ICD-10-CM | POA: Diagnosis not present

## 2021-04-30 DIAGNOSIS — E039 Hypothyroidism, unspecified: Secondary | ICD-10-CM | POA: Insufficient documentation

## 2021-04-30 DIAGNOSIS — Z20822 Contact with and (suspected) exposure to covid-19: Secondary | ICD-10-CM | POA: Diagnosis not present

## 2021-04-30 DIAGNOSIS — I10 Essential (primary) hypertension: Secondary | ICD-10-CM | POA: Insufficient documentation

## 2021-04-30 DIAGNOSIS — Z85850 Personal history of malignant neoplasm of thyroid: Secondary | ICD-10-CM | POA: Diagnosis not present

## 2021-04-30 DIAGNOSIS — Z91040 Latex allergy status: Secondary | ICD-10-CM | POA: Diagnosis not present

## 2021-04-30 DIAGNOSIS — R600 Localized edema: Secondary | ICD-10-CM | POA: Diagnosis not present

## 2021-04-30 LAB — CBC WITH DIFFERENTIAL/PLATELET
Abs Immature Granulocytes: 0.03 10*3/uL (ref 0.00–0.07)
Basophils Absolute: 0.1 10*3/uL (ref 0.0–0.1)
Basophils Relative: 1 %
Eosinophils Absolute: 0.5 10*3/uL (ref 0.0–0.5)
Eosinophils Relative: 5 %
HCT: 27 % — ABNORMAL LOW (ref 39.0–52.0)
Hemoglobin: 8.5 g/dL — ABNORMAL LOW (ref 13.0–17.0)
Immature Granulocytes: 0 %
Lymphocytes Relative: 14 %
Lymphs Abs: 1.3 10*3/uL (ref 0.7–4.0)
MCH: 28.4 pg (ref 26.0–34.0)
MCHC: 31.5 g/dL (ref 30.0–36.0)
MCV: 90.3 fL (ref 80.0–100.0)
Monocytes Absolute: 0.7 10*3/uL (ref 0.1–1.0)
Monocytes Relative: 8 %
Neutro Abs: 6.5 10*3/uL (ref 1.7–7.7)
Neutrophils Relative %: 72 %
Platelets: 558 10*3/uL — ABNORMAL HIGH (ref 150–400)
RBC: 2.99 MIL/uL — ABNORMAL LOW (ref 4.22–5.81)
RDW: 15.4 % (ref 11.5–15.5)
WBC: 9 10*3/uL (ref 4.0–10.5)
nRBC: 0 % (ref 0.0–0.2)

## 2021-04-30 LAB — COMPREHENSIVE METABOLIC PANEL
ALT: 88 U/L — ABNORMAL HIGH (ref 0–44)
AST: 56 U/L — ABNORMAL HIGH (ref 15–41)
Albumin: 3.4 g/dL — ABNORMAL LOW (ref 3.5–5.0)
Alkaline Phosphatase: 176 U/L — ABNORMAL HIGH (ref 38–126)
Anion gap: 10 (ref 5–15)
BUN: 16 mg/dL (ref 8–23)
CO2: 23 mmol/L (ref 22–32)
Calcium: 8.9 mg/dL (ref 8.9–10.3)
Chloride: 104 mmol/L (ref 98–111)
Creatinine, Ser: 1.31 mg/dL — ABNORMAL HIGH (ref 0.61–1.24)
GFR, Estimated: 56 mL/min — ABNORMAL LOW (ref >60)
Glucose, Bld: 98 mg/dL (ref 70–99)
Potassium: 3.9 mmol/L (ref 3.5–5.1)
Sodium: 137 mmol/L (ref 135–145)
Total Bilirubin: 0.9 mg/dL (ref 0.3–1.2)
Total Protein: 7.3 g/dL (ref 6.5–8.1)

## 2021-04-30 LAB — BRAIN NATRIURETIC PEPTIDE: B Natriuretic Peptide: 46.1 pg/mL (ref 0.0–100.0)

## 2021-04-30 NOTE — ED Notes (Signed)
This RN to bedside. Pt noted to be HOH. Per pt, post-op from R hip replacement. Has not had any follow-up rehab or exercise per wife. R foot noted to be swollen at this time, no redness noted. Per wife, pt's MD wanted pt to be evaluated here and to be able go to [facility] for rehab

## 2021-04-30 NOTE — ED Provider Notes (Signed)
[facility] Emergency Department Provider Note   ____________________________________________   I have reviewed the triage vital signs and the nursing notes.   HISTORY  Chief Complaint Rehab placement  History limited by: Not Limited   HPI Keith Williamson is a 75 y.o. male who presents to the emergency department today to help get placement at [facility]. Family states that patient had recent hospitalization at [facility] and right hip surgery. Was discharged and states that they have not been able to receive any home health care because of staffing issues.  The doctor then told them to present to the emergency department to help with placement.  Apparently they already have a bed waiting for them at [place] [facility] center but was told that they needed to come to the emergency department to get the appropriate paperwork for transfer. Patient does have some complaints of right leg swelling.   Records reviewed. Per medical record review patient has a history of recent US of RLE which was negative for DVT.   Past Medical History:  Diagnosis Date  . Arthritis   . BPH (benign prostatic hypertrophy)   . CAD (coronary artery disease)   . Diverticulosis   . Enlarged prostate   . GERD (gastroesophageal reflux disease)   . Headache(784.0)    frequent sinus headache  . Hurthle cell tumor   . Hyperlipidemia   . Hypertension   . Hypothyroidism   . MI (myocardial infarction) ([place]) 2014   inferior; s/p PCI with stent placement to RCA  . OA (osteoarthritis)   . Retina hole   . Senile nuclear sclerosis   . Thyroid cancer ([place])    iodine tx  . Valvular heart disease     Patient Active Problem List   Diagnosis Date Noted  . Spinal stenosis, lumbar 08/07/2012    Past Surgical History:  Procedure Laterality Date  . ANGIOPLASTY / STENTING FEMORAL    . BACK SURGERY    . COLONOSCOPY WITH PROPOFOL N/A 05/07/2016   Procedure: COLONOSCOPY WITH  PROPOFOL;  Surgeon: Lollie Sails, MD;  Location: [facility] ENDOSCOPY;  Service: Endoscopy;  Laterality: N/A;  . ESOPHAGOGASTRODUODENOSCOPY (EGD) WITH PROPOFOL N/A 05/07/2016   Procedure: ESOPHAGOGASTRODUODENOSCOPY (EGD) WITH PROPOFOL;  Surgeon: Lollie Sails, MD;  Location: [facility] ENDOSCOPY;  Service: Endoscopy;  Laterality: N/A;  . GREEN LIGHT LASER TURP (TRANSURETHRAL RESECTION OF PROSTATE N/A 01/01/2021   Procedure: GREEN LIGHT LASER TURP (TRANSURETHRAL RESECTION OF PROSTATE;  Surgeon: Royston Cowper, MD;  Location: [facility] ORS;  Service: Urology;  Laterality: N/A;  . KNEE ARTHROSCOPY Left    Medial and lateral meniscectomy, partial  . LUMBAR LAMINECTOMY  08/16/2012   L 3 4 & 5  . LUMBAR MICRODISCECTOMY     L3-L4; with partial hemilaminectomy, partial facetectomy and foraminotomy  . TOTAL THYROIDECTOMY    . TURP VAPORIZATION      Prior to Admission medications   Medication Sig Start Date End Date Taking? Authorizing Provider  allopurinol (ZYLOPRIM) 100 MG tablet Take 100 mg by mouth daily.    [provider]  Alpha-Lipoic Acid 600 MG CAPS Take 600 mg by mouth daily.    [provider]  aspirin 81 MG tablet Take 81 mg by mouth daily.    [provider]  candesartan (ATACAND) 32 MG tablet Take 32 mg by mouth daily.    [provider]  carvedilol (COREG) 3.125 MG tablet Take 3.125 mg by mouth 2 (two) times daily with a meal.  [provider]  ciprofloxacin (CIPRO) 500 MG tablet Take 1 tablet (500 mg total) by mouth 2 (two) times daily. 01/01/21   Royston Cowper, MD  diltiazem (CARDIZEM CD) 240 MG 24 hr capsule Take 240 mg by mouth at bedtime.    [provider]  docusate sodium (COLACE) 100 MG capsule Take 2 capsules (200 mg total) by mouth 2 (two) times daily. 01/01/21   Royston Cowper, MD  dutasteride (AVODART) 0.5 MG capsule Take 0.5 mg by mouth daily.    [provider]  gabapentin (NEURONTIN) 300 MG capsule Take 300 mg by  mouth 2 (two) times daily.    [provider]  levothyroxine (SYNTHROID, LEVOTHROID) 137 MCG tablet Take 137 mcg by mouth daily before breakfast.    [provider]  Multiple Vitamins-Minerals (MULTIVITAMIN WITH MINERALS) tablet Take 1 tablet by mouth daily.    [provider]  nitroGLYCERIN (NITROSTAT) 0.4 MG SL tablet Place 0.4 mg under the tongue every 5 (five) minutes as needed for chest pain.    [provider]  phenazopyridine (PYRIDIUM) 200 MG tablet Take 1 tablet (200 mg total) by mouth 3 (three) times daily as needed for pain (dysuria). 01/01/21   Royston Cowper, MD  rosuvastatin (CRESTOR) 40 MG tablet Take 40 mg by mouth daily.    [provider]  silodosin (RAPAFLO) 8 MG CAPS capsule Take 8 mg by mouth every evening.    [provider]    Allergies Levsin [hyoscyamine], Beta adrenergic blockers, Hydralazine, Hydrochlorothiazide, Norvasc [amlodipine], Other, and Zetia [ezetimibe]  History reviewed. No pertinent family history.  Social History Social History   Tobacco Use  . Smoking status: Former Smoker    Quit date: 12/17/1981    Years since quitting: 39.3  . Smokeless tobacco: Never Used  Substance Use Topics  . Alcohol use: No  . Drug use: No    Review of Systems Constitutional: No fever/chills Eyes: No visual changes. ENT: No sore throat. Cardiovascular: Denies chest pain. Respiratory: Denies shortness of breath. Gastrointestinal: No abdominal pain.  No nausea, no vomiting.  No diarrhea.   Genitourinary: Negative for dysuria. Musculoskeletal: Positive for right lower extremity edema.  Skin: Negative for rash. Neurological: Negative for headaches, focal weakness or numbness.  ____________________________________________   PHYSICAL EXAM:  VITAL SIGNS: ED Triage Vitals  Enc Vitals Group     BP 04/30/21 1650 (!) 157/99     Pulse Rate 04/30/21 1650 88     Resp 04/30/21 1650 18     Temp 04/30/21 1650 98.3  F (36.8 C)     Temp Source 04/30/21 1650 Oral     SpO2 04/30/21 1650 100 %     Weight 04/30/21 1648 242 lb (109.8 kg)     Height 04/30/21 1648 6\' 1"  (1.854 m)     Head Circumference --      Peak Flow --      Pain Score 04/30/21 1648 7   Constitutional: Alert and oriented.  Eyes: Conjunctivae are normal.  ENT      Head: Normocephalic and atraumatic.      Nose: No congestion/rhinnorhea.      Mouth/Throat: Mucous membranes are moist.      Neck: No stridor. Hematological/Lymphatic/Immunilogical: No cervical lymphadenopathy. Cardiovascular: Normal rate, regular rhythm.  No murmurs, rubs, or gallops.  Respiratory: Normal respiratory effort without tachypnea nor retractions. Breath sounds are clear and equal bilaterally. No wheezes/rales/rhonchi. Gastrointestinal: Soft and non tender. No rebound. No guarding.  Genitourinary: Deferred  Musculoskeletal: Normal range of motion in all extremities. Positive for lower extremity edema.  Neurologic:  Normal speech and language. No gross focal neurologic deficits are appreciated.  Skin:  Skin is warm, dry and intact. No rash noted. Psychiatric: Mood and affect are normal. Speech and behavior are normal. Patient exhibits appropriate insight and judgment.  ____________________________________________    LABS (pertinent positives/negatives)  BNP 46.1 CBC wbc 9.0, hgb 8.5, plt 558 CMP na 137, k 3.9, cr 1.31  ____________________________________________   EKG  None  ____________________________________________    RADIOLOGY  None  ____________________________________________   PROCEDURES  Procedures  ____________________________________________   INITIAL IMPRESSION / ASSESSMENT AND PLAN / ED COURSE  Pertinent labs & imaging results that were available during my care of the patient were reviewed by me and considered in my medical decision making (see chart for details).   Patient presents to the emergency department as  suggested by outpatient physician to help facilitate placement at [place] [facility] for rehab.  Patient did have some complaint of right leg swelling and it is somewhat swollen although recent outpatient work-up was negative for DVT.  Will have social work and PT evaluate.    ____________________________________________   FINAL CLINICAL IMPRESSION(S) / ED DIAGNOSES  Final diagnoses:  Encounter for rehabilitation     Note: This dictation was prepared with Dragon dictation. Any transcriptional errors that result from this process are unintentional     Nance Pear, MD 05/01/21 0005

## 2021-04-30 NOTE — ED Provider Notes (Incomplete)
[facility] Emergency Department Provider Note   ____________________________________________   I have reviewed the triage vital signs and the nursing notes.   HISTORY  Chief Complaint Rehab placement  History limited by: Not Limited   HPI Keith Williamson is a 75 y.o. male who presents to the emergency department today to help get placement at [facility]. Family states that patient had recent hospitalization at [facility] and right hip surgery. Was discharged and states that they have not been able to receive any home health care because of staffing issues.  The doctor then told them to present to the emergency department to help with placement.  Apparently they already have a bed waiting for them at [place] [facility] center but was told that they needed to come to the emergency department to get the appropriate paperwork for transfer. Patient does have some complaints of right leg swelling.   Records reviewed. Per medical record review patient has a history of recent US of RLE which was negative for DVT.   Past Medical History:  Diagnosis Date  . Arthritis   . BPH (benign prostatic hypertrophy)   . CAD (coronary artery disease)   . Diverticulosis   . Enlarged prostate   . GERD (gastroesophageal reflux disease)   . Headache(784.0)    frequent sinus headache  . Hurthle cell tumor   . Hyperlipidemia   . Hypertension   . Hypothyroidism   . MI (myocardial infarction) ([place]) 2014   inferior; s/p PCI with stent placement to RCA  . OA (osteoarthritis)   . Retina hole   . Senile nuclear sclerosis   . Thyroid cancer ([place])    iodine tx  . Valvular heart disease     Patient Active Problem List   Diagnosis Date Noted  . Spinal stenosis, lumbar 08/07/2012    Past Surgical History:  Procedure Laterality Date  . ANGIOPLASTY / STENTING FEMORAL    . BACK SURGERY    . COLONOSCOPY WITH PROPOFOL N/A 05/07/2016   Procedure: COLONOSCOPY WITH  PROPOFOL;  Surgeon: Lollie Sails, MD;  Location: [facility] ENDOSCOPY;  Service: Endoscopy;  Laterality: N/A;  . ESOPHAGOGASTRODUODENOSCOPY (EGD) WITH PROPOFOL N/A 05/07/2016   Procedure: ESOPHAGOGASTRODUODENOSCOPY (EGD) WITH PROPOFOL;  Surgeon: Lollie Sails, MD;  Location: [facility] ENDOSCOPY;  Service: Endoscopy;  Laterality: N/A;  . GREEN LIGHT LASER TURP (TRANSURETHRAL RESECTION OF PROSTATE N/A 01/01/2021   Procedure: GREEN LIGHT LASER TURP (TRANSURETHRAL RESECTION OF PROSTATE;  Surgeon: Royston Cowper, MD;  Location: [facility] ORS;  Service: Urology;  Laterality: N/A;  . KNEE ARTHROSCOPY Left    Medial and lateral meniscectomy, partial  . LUMBAR LAMINECTOMY  08/16/2012   L 3 4 & 5  . LUMBAR MICRODISCECTOMY     L3-L4; with partial hemilaminectomy, partial facetectomy and foraminotomy  . TOTAL THYROIDECTOMY    . TURP VAPORIZATION      Prior to Admission medications   Medication Sig Start Date End Date Taking? Authorizing Provider  allopurinol (ZYLOPRIM) 100 MG tablet Take 100 mg by mouth daily.    [provider]  Alpha-Lipoic Acid 600 MG CAPS Take 600 mg by mouth daily.    [provider]  aspirin 81 MG tablet Take 81 mg by mouth daily.    [provider]  candesartan (ATACAND) 32 MG tablet Take 32 mg by mouth daily.    [provider]  carvedilol (COREG) 3.125 MG tablet Take 3.125 mg by mouth 2 (two) times daily with a meal.  [provider]  ciprofloxacin (CIPRO) 500 MG tablet Take 1 tablet (500 mg total) by mouth 2 (two) times daily. 01/01/21   Royston Cowper, MD  diltiazem (CARDIZEM CD) 240 MG 24 hr capsule Take 240 mg by mouth at bedtime.    [provider]  docusate sodium (COLACE) 100 MG capsule Take 2 capsules (200 mg total) by mouth 2 (two) times daily. 01/01/21   Royston Cowper, MD  dutasteride (AVODART) 0.5 MG capsule Take 0.5 mg by mouth daily.    [provider]  gabapentin (NEURONTIN) 300 MG capsule Take 300 mg by  mouth 2 (two) times daily.    [provider]  levothyroxine (SYNTHROID, LEVOTHROID) 137 MCG tablet Take 137 mcg by mouth daily before breakfast.    [provider]  Multiple Vitamins-Minerals (MULTIVITAMIN WITH MINERALS) tablet Take 1 tablet by mouth daily.    [provider]  nitroGLYCERIN (NITROSTAT) 0.4 MG SL tablet Place 0.4 mg under the tongue every 5 (five) minutes as needed for chest pain.    [provider]  phenazopyridine (PYRIDIUM) 200 MG tablet Take 1 tablet (200 mg total) by mouth 3 (three) times daily as needed for pain (dysuria). 01/01/21   Royston Cowper, MD  rosuvastatin (CRESTOR) 40 MG tablet Take 40 mg by mouth daily.    [provider]  silodosin (RAPAFLO) 8 MG CAPS capsule Take 8 mg by mouth every evening.    [provider]    Allergies Levsin [hyoscyamine], Beta adrenergic blockers, Hydralazine, Hydrochlorothiazide, Norvasc [amlodipine], Other, and Zetia [ezetimibe]  History reviewed. No pertinent family history.  Social History Social History   Tobacco Use  . Smoking status: Former Smoker    Quit date: 12/17/1981    Years since quitting: 39.3  . Smokeless tobacco: Never Used  Substance Use Topics  . Alcohol use: No  . Drug use: No    Review of Systems Constitutional: No fever/chills Eyes: No visual changes. ENT: No sore throat. Cardiovascular: Denies chest pain. Respiratory: Denies shortness of breath. Gastrointestinal: No abdominal pain.  No nausea, no vomiting.  No diarrhea.   Genitourinary: Negative for dysuria. Musculoskeletal: Positive for right lower extremity edema.  Skin: Negative for rash. Neurological: Negative for headaches, focal weakness or numbness.  ____________________________________________   PHYSICAL EXAM:  VITAL SIGNS: ED Triage Vitals  Enc Vitals Group     BP 04/30/21 1650 (!) 157/99     Pulse Rate 04/30/21 1650 88     Resp 04/30/21 1650 18     Temp 04/30/21 1650 98.3  F (36.8 C)     Temp Source 04/30/21 1650 Oral     SpO2 04/30/21 1650 100 %     Weight 04/30/21 1648 242 lb (109.8 kg)     Height 04/30/21 1648 6\' 1"  (1.854 m)     Head Circumference --      Peak Flow --      Pain Score 04/30/21 1648 7   Constitutional: Alert and oriented.  Eyes: Conjunctivae are normal.  ENT      Head: Normocephalic and atraumatic.      Nose: No congestion/rhinnorhea.      Mouth/Throat: Mucous membranes are moist.      Neck: No stridor. Hematological/Lymphatic/Immunilogical: No cervical lymphadenopathy. Cardiovascular: Normal rate, regular rhythm.  No murmurs, rubs, or gallops.  Respiratory: Normal respiratory effort without tachypnea nor retractions. Breath sounds are clear and equal bilaterally. No wheezes/rales/rhonchi. Gastrointestinal: Soft and non tender. No rebound. No guarding.  Genitourinary: Deferred  Musculoskeletal: Normal range of motion in all extremities. Positive for lower extremity edema.  Neurologic:  Normal speech and language. No gross focal neurologic deficits are appreciated.  Skin:  Skin is warm, dry and intact. No rash noted. Psychiatric: Mood and affect are normal. Speech and behavior are normal. Patient exhibits appropriate insight and judgment.  ____________________________________________    LABS (pertinent positives/negatives)  BNP 46.1 CBC wbc 9.0, hgb 8.5, plt 558 CMP na 137, k 3.9, cr 1.31  ____________________________________________   EKG  None  ____________________________________________    RADIOLOGY  None  ____________________________________________   PROCEDURES  Procedures  ____________________________________________   INITIAL IMPRESSION / ASSESSMENT AND PLAN / ED COURSE  Pertinent labs & imaging results that were available during my care of the patient were reviewed by me and considered in my medical decision making (see chart for details).   Patient presents to the emergency department as  suggested by outpatient physician to help facilitate placement at [place] [facility] for rehab.  Patient did have some complaint of right leg swelling and it is somewhat swollen although recent outpatient work-up was negative for DVT.  Will have social work and PT evaluate.    ____________________________________________   FINAL CLINICAL IMPRESSION(S) / ED DIAGNOSES  Final diagnoses:  Encounter for rehabilitation     Note: This dictation was prepared with Dragon dictation. Any transcriptional errors that result from this process are unintentional

## 2021-04-30 NOTE — ED Triage Notes (Signed)
Pt had surgery at [facility] 04/10/2021, right hip surgery for right hip fx. Pt has had no follow up since coming home, no PT or exercise per wife. Pt returned to [place] and has [place] to r/o blood clot. No blood clot seen per wife. Pt's MD wants pt to go rehab and is coming to ER to get to rehab at [facility] center. Pt with swollen right foot and calf. Pt states it is painful. No redness noted.

## 2021-05-01 LAB — RESP PANEL BY RT-PCR (FLU A&B, COVID) ARPGX2
Influenza A by PCR: NEGATIVE
Influenza B by PCR: NEGATIVE
SARS Coronavirus 2 by RT PCR: NEGATIVE

## 2021-05-01 MED ORDER — DOCUSATE SODIUM 100 MG PO CAPS
200.0000 mg | ORAL_CAPSULE | Freq: Two times a day (BID) | ORAL | Status: DC
  Filled 2021-05-01: qty 2

## 2021-05-01 MED ORDER — CARVEDILOL 6.25 MG PO TABS
3.1250 mg | ORAL_TABLET | Freq: Two times a day (BID) | ORAL | Status: DC
  Administered 2021-05-01: 3.125 mg via ORAL
  Filled 2021-05-01: qty 1

## 2021-05-01 MED ORDER — LEVOTHYROXINE SODIUM 137 MCG PO TABS
137.0000 ug | ORAL_TABLET | Freq: Every day | ORAL | Status: DC
  Administered 2021-05-01: 137 ug via ORAL
  Filled 2021-05-01: qty 1

## 2021-05-01 MED ORDER — IRBESARTAN 150 MG PO TABS
300.0000 mg | ORAL_TABLET | Freq: Every day | ORAL | Status: DC
  Administered 2021-05-01: 300 mg via ORAL
  Filled 2021-05-01: qty 2

## 2021-05-01 MED ORDER — ALLOPURINOL 100 MG PO TABS
100.0000 mg | ORAL_TABLET | Freq: Every day | ORAL | Status: DC
  Administered 2021-05-01: 100 mg via ORAL
  Filled 2021-05-01: qty 1

## 2021-05-01 MED ORDER — DILTIAZEM HCL ER COATED BEADS 240 MG PO CP24
240.0000 mg | ORAL_CAPSULE | Freq: Every day | ORAL | Status: DC
  Filled 2021-05-01: qty 1

## 2021-05-01 MED ORDER — GABAPENTIN 300 MG PO CAPS: 300.0000 mg | ORAL_CAPSULE | Freq: Two times a day (BID) | ORAL | Status: DC

## 2021-05-01 MED ORDER — DUTASTERIDE 0.5 MG PO CAPS
0.5000 mg | ORAL_CAPSULE | Freq: Every day | ORAL | Status: DC
  Administered 2021-05-01: 0.5 mg via ORAL
  Filled 2021-05-01: qty 1

## 2021-05-01 MED ORDER — ASPIRIN EC 81 MG PO TBEC: 81.0000 mg | DELAYED_RELEASE_TABLET | Freq: Every day | ORAL | Status: DC

## 2021-05-01 MED ORDER — ROSUVASTATIN CALCIUM 20 MG PO TABS
40.0000 mg | ORAL_TABLET | Freq: Every day | ORAL | Status: DC
  Administered 2021-05-01: 40 mg via ORAL
  Filled 2021-05-01: qty 2

## 2021-05-01 MED ORDER — ENOXAPARIN SODIUM 30 MG/0.3ML IJ SOSY
30.0000 mg | PREFILLED_SYRINGE | Freq: Two times a day (BID) | INTRAMUSCULAR | Status: DC
  Administered 2021-05-01: 30 mg via SUBCUTANEOUS
  Filled 2021-05-01 (×2): qty 0.3

## 2021-05-01 MED ORDER — ADULT MULTIVITAMIN W/MINERALS CH
1.0000 | ORAL_TABLET | Freq: Every day | ORAL | Status: DC
  Administered 2021-05-01: 1 via ORAL
  Filled 2021-05-01: qty 1

## 2021-05-01 NOTE — ED Notes (Signed)
Physical Therapy here to see pt at this time
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
## 2021-05-01 NOTE — ED Notes (Signed)
Pt in room with spouse urinating.
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
## 2021-05-01 NOTE — ED Notes (Signed)
Pt states he was told the MD would order compression stockings last night and he wants something to provide compression to reduce swelling while he waits for D/C/dispo. Ace wrap applied to right foot and calf. Right foot appears slightly swollen in comparison to left, CMS intact, capillary refill <3 seconds.

## 2021-05-13 DIAGNOSIS — M549 Dorsalgia, unspecified: Secondary | ICD-10-CM | POA: Diagnosis not present

## 2021-05-13 DIAGNOSIS — S0240ED Zygomatic fracture, right side, subsequent encounter for fracture with routine healing: Secondary | ICD-10-CM | POA: Diagnosis not present

## 2021-05-14 DIAGNOSIS — Z041 Encounter for examination and observation following transport accident: Secondary | ICD-10-CM | POA: Diagnosis not present

## 2021-05-14 DIAGNOSIS — S065X9A Traumatic subdural hemorrhage with loss of consciousness of unspecified duration, initial encounter: Secondary | ICD-10-CM | POA: Diagnosis not present

## 2021-05-21 DIAGNOSIS — R29898 Other symptoms and signs involving the musculoskeletal system: Secondary | ICD-10-CM | POA: Diagnosis not present

## 2021-05-21 DIAGNOSIS — Z8781 Personal history of (healed) traumatic fracture: Secondary | ICD-10-CM | POA: Diagnosis not present

## 2021-05-21 DIAGNOSIS — M25551 Pain in right hip: Secondary | ICD-10-CM | POA: Diagnosis not present

## 2021-05-21 DIAGNOSIS — Z9889 Other specified postprocedural states: Secondary | ICD-10-CM | POA: Diagnosis not present

## 2021-05-21 DIAGNOSIS — M25651 Stiffness of right hip, not elsewhere classified: Secondary | ICD-10-CM | POA: Diagnosis not present

## 2021-05-25 DIAGNOSIS — S32431A Displaced fracture of anterior column [iliopubic] of right acetabulum, initial encounter for closed fracture: Secondary | ICD-10-CM | POA: Diagnosis not present

## 2021-05-26 DIAGNOSIS — Z9889 Other specified postprocedural states: Secondary | ICD-10-CM | POA: Diagnosis not present

## 2021-05-26 DIAGNOSIS — M25551 Pain in right hip: Secondary | ICD-10-CM | POA: Diagnosis not present

## 2021-05-26 DIAGNOSIS — Z8781 Personal history of (healed) traumatic fracture: Secondary | ICD-10-CM | POA: Diagnosis not present

## 2021-05-30 DIAGNOSIS — I82401 Acute embolism and thrombosis of unspecified deep veins of right lower extremity: Secondary | ICD-10-CM | POA: Insufficient documentation

## 2021-05-30 DIAGNOSIS — I129 Hypertensive chronic kidney disease with stage 1 through stage 4 chronic kidney disease, or unspecified chronic kidney disease: Secondary | ICD-10-CM | POA: Diagnosis not present

## 2021-05-30 DIAGNOSIS — Z7982 Long term (current) use of aspirin: Secondary | ICD-10-CM | POA: Diagnosis not present

## 2021-05-30 DIAGNOSIS — Z79899 Other long term (current) drug therapy: Secondary | ICD-10-CM | POA: Insufficient documentation

## 2021-05-30 DIAGNOSIS — R55 Syncope and collapse: Principal | ICD-10-CM | POA: Insufficient documentation

## 2021-05-30 DIAGNOSIS — Z85850 Personal history of malignant neoplasm of thyroid: Secondary | ICD-10-CM | POA: Insufficient documentation

## 2021-05-30 DIAGNOSIS — E039 Hypothyroidism, unspecified: Secondary | ICD-10-CM | POA: Diagnosis not present

## 2021-05-30 DIAGNOSIS — Z20822 Contact with and (suspected) exposure to covid-19: Secondary | ICD-10-CM | POA: Diagnosis not present

## 2021-05-30 DIAGNOSIS — J9811 Atelectasis: Secondary | ICD-10-CM | POA: Diagnosis not present

## 2021-05-30 DIAGNOSIS — Z87891 Personal history of nicotine dependence: Secondary | ICD-10-CM | POA: Diagnosis not present

## 2021-05-30 DIAGNOSIS — N1831 Chronic kidney disease, stage 3a: Secondary | ICD-10-CM | POA: Diagnosis not present

## 2021-05-30 DIAGNOSIS — R0602 Shortness of breath: Secondary | ICD-10-CM | POA: Insufficient documentation

## 2021-05-30 DIAGNOSIS — I2510 Atherosclerotic heart disease of native coronary artery without angina pectoris: Secondary | ICD-10-CM | POA: Insufficient documentation

## 2021-05-30 DIAGNOSIS — I517 Cardiomegaly: Secondary | ICD-10-CM | POA: Diagnosis not present

## 2021-05-31 ENCOUNTER — Emergency Department: Payer: PPO

## 2021-05-31 ENCOUNTER — Observation Stay
Admission: EM | Admit: 2021-05-31 | Discharge: 2021-06-01 | Disposition: A | Discharge status: Home / Self Care | Payer: PPO | Attending: Internal Medicine | Admitting: Internal Medicine

## 2021-05-31 ENCOUNTER — Encounter: Payer: Self-pay | Admitting: Family Medicine

## 2021-05-31 ENCOUNTER — Other Ambulatory Visit: Payer: Self-pay

## 2021-05-31 ENCOUNTER — Observation Stay (HOSPITAL_BASED_OUTPATIENT_CLINIC_OR_DEPARTMENT_OTHER)
Admit: 2021-05-31 | Discharge: 2021-05-31 | Disposition: A | Discharge status: Home / Self Care | Payer: PPO | Attending: Family Medicine | Admitting: Family Medicine

## 2021-05-31 ENCOUNTER — Observation Stay: Payer: PPO

## 2021-05-31 DIAGNOSIS — R55 Syncope and collapse: Secondary | ICD-10-CM

## 2021-05-31 DIAGNOSIS — I340 Nonrheumatic mitral (valve) insufficiency: Secondary | ICD-10-CM

## 2021-05-31 DIAGNOSIS — E039 Hypothyroidism, unspecified: Secondary | ICD-10-CM

## 2021-05-31 DIAGNOSIS — I82461 Acute embolism and thrombosis of right calf muscular vein: Secondary | ICD-10-CM | POA: Diagnosis not present

## 2021-05-31 DIAGNOSIS — I6523 Occlusion and stenosis of bilateral carotid arteries: Secondary | ICD-10-CM | POA: Diagnosis not present

## 2021-05-31 DIAGNOSIS — I517 Cardiomegaly: Secondary | ICD-10-CM | POA: Diagnosis not present

## 2021-05-31 DIAGNOSIS — I10 Essential (primary) hypertension: Secondary | ICD-10-CM

## 2021-05-31 DIAGNOSIS — R0602 Shortness of breath: Secondary | ICD-10-CM | POA: Diagnosis not present

## 2021-05-31 DIAGNOSIS — J9811 Atelectasis: Secondary | ICD-10-CM | POA: Diagnosis not present

## 2021-05-31 DIAGNOSIS — I82401 Acute embolism and thrombosis of unspecified deep veins of right lower extremity: Secondary | ICD-10-CM

## 2021-05-31 DIAGNOSIS — R7989 Other specified abnormal findings of blood chemistry: Secondary | ICD-10-CM | POA: Diagnosis not present

## 2021-05-31 LAB — BASIC METABOLIC PANEL
Anion gap: 6 (ref 5–15)
BUN: 23 mg/dL (ref 8–23)
CO2: 28 mmol/L (ref 22–32)
Calcium: 9.3 mg/dL (ref 8.9–10.3)
Chloride: 101 mmol/L (ref 98–111)
Creatinine, Ser: 1.79 mg/dL — ABNORMAL HIGH (ref 0.61–1.24)
GFR, Estimated: 39 mL/min — ABNORMAL LOW (ref >60)
Glucose, Bld: 115 mg/dL — ABNORMAL HIGH (ref 70–99)
Potassium: 3.4 mmol/L — ABNORMAL LOW (ref 3.5–5.1)
Sodium: 135 mmol/L (ref 135–145)

## 2021-05-31 LAB — ECHOCARDIOGRAM COMPLETE
AR max vel: 3.77 cm2
AV Area VTI: 3.75 cm2
AV Area mean vel: 4.02 cm2
AV Mean grad: 3 mmHg
AV Peak grad: 5 mmHg
Ao pk vel: 1.12 m/s
Height: 73 in
S' Lateral: 2.6 cm
Weight: 3880.1 oz

## 2021-05-31 LAB — URINALYSIS, COMPLETE (UACMP) WITH MICROSCOPIC
Bacteria, UA: NONE SEEN
Bilirubin Urine: NEGATIVE
Glucose, UA: NEGATIVE mg/dL
Hgb urine dipstick: NEGATIVE
Ketones, ur: NEGATIVE mg/dL
Nitrite: NEGATIVE
Protein, ur: NEGATIVE mg/dL
Specific Gravity, Urine: 1.017 (ref 1.005–1.030)
pH: 5 (ref 5.0–8.0)

## 2021-05-31 LAB — CBC
HCT: 33.2 % — ABNORMAL LOW (ref 39.0–52.0)
Hemoglobin: 10.6 g/dL — ABNORMAL LOW (ref 13.0–17.0)
MCH: 27.5 pg (ref 26.0–34.0)
MCHC: 31.9 g/dL (ref 30.0–36.0)
MCV: 86.2 fL (ref 80.0–100.0)
Platelets: 313 10*3/uL (ref 150–400)
RBC: 3.85 MIL/uL — ABNORMAL LOW (ref 4.22–5.81)
RDW: 14.8 % (ref 11.5–15.5)
WBC: 7.9 10*3/uL (ref 4.0–10.5)
nRBC: 0 % (ref 0.0–0.2)

## 2021-05-31 LAB — RESP PANEL BY RT-PCR (FLU A&B, COVID) ARPGX2
Influenza A by PCR: NEGATIVE
Influenza B by PCR: NEGATIVE
SARS Coronavirus 2 by RT PCR: NEGATIVE

## 2021-05-31 LAB — D-DIMER, QUANTITATIVE: D-Dimer, Quant: 2.72 ug/mL-FEU — ABNORMAL HIGH (ref 0.00–0.50)

## 2021-05-31 LAB — BRAIN NATRIURETIC PEPTIDE: B Natriuretic Peptide: 13.7 pg/mL (ref 0.0–100.0)

## 2021-05-31 LAB — TROPONIN I (HIGH SENSITIVITY)
Troponin I (High Sensitivity): 2 ng/L (ref <18)
Troponin I (High Sensitivity): 2 ng/L (ref <18)

## 2021-05-31 MED ORDER — ALPHA-LIPOIC ACID 600 MG PO CAPS: 600.0000 mg | ORAL_CAPSULE | Freq: Every day | ORAL | Status: DC

## 2021-05-31 MED ORDER — ACETAMINOPHEN 325 MG PO TABS
650.0000 mg | ORAL_TABLET | Freq: Four times a day (QID) | ORAL | Status: DC | PRN
  Administered 2021-05-31: 23:00:00 650 mg via ORAL
  Filled 2021-05-31: qty 2

## 2021-05-31 MED ORDER — APIXABAN 5 MG PO TABS: 5.0000 mg | ORAL_TABLET | Freq: Two times a day (BID) | ORAL | Status: DC

## 2021-05-31 MED ORDER — MECLIZINE HCL 25 MG PO TABS
25.0000 mg | ORAL_TABLET | Freq: Four times a day (QID) | ORAL | Status: DC | PRN
  Administered 2021-05-31: 25 mg via ORAL
  Filled 2021-05-31 (×2): qty 1

## 2021-05-31 MED ORDER — ONDANSETRON HCL 4 MG/2ML IJ SOLN: 4.0000 mg | Freq: Four times a day (QID) | INTRAMUSCULAR | Status: DC | PRN

## 2021-05-31 MED ORDER — ACETAMINOPHEN 650 MG RE SUPP: 650.0000 mg | Freq: Four times a day (QID) | RECTAL | Status: DC | PRN

## 2021-05-31 MED ORDER — SODIUM CHLORIDE 0.9% FLUSH
3.0000 mL | Freq: Two times a day (BID) | INTRAVENOUS | Status: DC
  Administered 2021-05-31 – 2021-06-01 (×2): 3 mL via INTRAVENOUS

## 2021-05-31 MED ORDER — ASPIRIN EC 81 MG PO TBEC
81.0000 mg | DELAYED_RELEASE_TABLET | Freq: Every day | ORAL | Status: DC
  Administered 2021-05-31 – 2021-06-01 (×2): 81 mg via ORAL
  Filled 2021-05-31 (×2): qty 1

## 2021-05-31 MED ORDER — TRAZODONE HCL 50 MG PO TABS: 25.0000 mg | ORAL_TABLET | Freq: Every evening | ORAL | Status: DC | PRN

## 2021-05-31 MED ORDER — TAMSULOSIN HCL 0.4 MG PO CAPS: 0.4000 mg | ORAL_CAPSULE | Freq: Every day | ORAL | Status: DC

## 2021-05-31 MED ORDER — APIXABAN 5 MG PO TABS
10.0000 mg | ORAL_TABLET | Freq: Two times a day (BID) | ORAL | Status: DC
  Administered 2021-05-31 – 2021-06-01 (×3): 10 mg via ORAL
  Filled 2021-05-31 (×3): qty 2

## 2021-05-31 MED ORDER — SODIUM CHLORIDE 0.9 % IV BOLUS (SEPSIS)
500.0000 mL | Freq: Once | INTRAVENOUS | Status: AC
  Administered 2021-05-31: 500 mL via INTRAVENOUS

## 2021-05-31 MED ORDER — LEVOTHYROXINE SODIUM 137 MCG PO TABS
137.0000 ug | ORAL_TABLET | Freq: Every day | ORAL | Status: DC
  Administered 2021-05-31 – 2021-06-01 (×2): 137 ug via ORAL
  Filled 2021-05-31 (×3): qty 1

## 2021-05-31 MED ORDER — POTASSIUM CHLORIDE IN NACL 20-0.9 MEQ/L-% IV SOLN
INTRAVENOUS | Status: DC
  Filled 2021-05-31: qty 1000

## 2021-05-31 MED ORDER — MAGNESIUM HYDROXIDE 400 MG/5ML PO SUSP
30.0000 mL | Freq: Every day | ORAL | Status: DC | PRN
  Filled 2021-05-31: qty 30

## 2021-05-31 MED ORDER — CARVEDILOL 3.125 MG PO TABS
3.1250 mg | ORAL_TABLET | Freq: Two times a day (BID) | ORAL | Status: DC
  Administered 2021-05-31 – 2021-06-01 (×3): 3.125 mg via ORAL
  Filled 2021-05-31 (×3): qty 1

## 2021-05-31 MED ORDER — FENTANYL CITRATE (PF) 100 MCG/2ML IJ SOLN
50.0000 ug | Freq: Once | INTRAMUSCULAR | Status: AC
  Administered 2021-05-31: 50 ug via INTRAVENOUS
  Filled 2021-05-31: qty 2

## 2021-05-31 MED ORDER — GABAPENTIN 300 MG PO CAPS
300.0000 mg | ORAL_CAPSULE | Freq: Two times a day (BID) | ORAL | Status: DC
  Administered 2021-05-31 – 2021-06-01 (×3): 300 mg via ORAL
  Filled 2021-05-31 (×3): qty 1

## 2021-05-31 MED ORDER — ADULT MULTIVITAMIN W/MINERALS CH
1.0000 | ORAL_TABLET | Freq: Every day | ORAL | Status: DC
  Administered 2021-05-31 – 2021-06-01 (×2): 1 via ORAL
  Filled 2021-05-31 (×4): qty 1

## 2021-05-31 MED ORDER — POTASSIUM CHLORIDE 20 MEQ PO PACK
40.0000 meq | PACK | Freq: Once | ORAL | Status: AC
  Administered 2021-05-31: 40 meq via ORAL
  Filled 2021-05-31: qty 2

## 2021-05-31 MED ORDER — IOHEXOL 350 MG/ML SOLN
80.0000 mL | Freq: Once | INTRAVENOUS | Status: AC | PRN
  Administered 2021-05-31: 80 mL via INTRAVENOUS

## 2021-05-31 MED ORDER — NITROGLYCERIN 0.4 MG SL SUBL: 0.4000 mg | SUBLINGUAL_TABLET | SUBLINGUAL | Status: DC | PRN

## 2021-05-31 MED ORDER — DUTASTERIDE 0.5 MG PO CAPS
0.5000 mg | ORAL_CAPSULE | Freq: Every day | ORAL | Status: DC
  Administered 2021-05-31 – 2021-06-01 (×2): 0.5 mg via ORAL
  Filled 2021-05-31 (×2): qty 1

## 2021-05-31 MED ORDER — IRBESARTAN 75 MG PO TABS
37.5000 mg | ORAL_TABLET | Freq: Every day | ORAL | Status: DC
  Administered 2021-06-01: 37.5 mg via ORAL
  Filled 2021-05-31: qty 0.5

## 2021-05-31 MED ORDER — ALLOPURINOL 100 MG PO TABS
100.0000 mg | ORAL_TABLET | Freq: Every day | ORAL | Status: DC
  Administered 2021-05-31 – 2021-06-01 (×2): 100 mg via ORAL
  Filled 2021-05-31 (×3): qty 1

## 2021-05-31 MED ORDER — ENOXAPARIN SODIUM 60 MG/0.6ML IJ SOSY
0.5000 mg/kg | PREFILLED_SYRINGE | INTRAMUSCULAR | Status: DC
  Administered 2021-05-31: 55 mg via SUBCUTANEOUS
  Filled 2021-05-31: qty 0.6

## 2021-05-31 MED ORDER — ONDANSETRON HCL 4 MG PO TABS: 4.0000 mg | ORAL_TABLET | Freq: Four times a day (QID) | ORAL | Status: DC | PRN

## 2021-05-31 MED ORDER — FLUTICASONE PROPIONATE 50 MCG/ACT NA SUSP
1.0000 | Freq: Two times a day (BID) | NASAL | Status: DC
  Administered 2021-05-31 – 2021-06-01 (×2): 1 via NASAL
  Filled 2021-05-31 (×2): qty 16

## 2021-05-31 MED ORDER — DILTIAZEM HCL ER COATED BEADS 240 MG PO CP24
240.0000 mg | ORAL_CAPSULE | Freq: Every day | ORAL | Status: DC
  Administered 2021-05-31: 240 mg via ORAL
  Filled 2021-05-31 (×3): qty 1

## 2021-05-31 MED ORDER — DOCUSATE SODIUM 100 MG PO CAPS
200.0000 mg | ORAL_CAPSULE | Freq: Two times a day (BID) | ORAL | Status: DC
  Administered 2021-05-31 – 2021-06-01 (×3): 200 mg via ORAL
  Filled 2021-05-31 (×3): qty 2

## 2021-05-31 NOTE — Consult Note (Signed)
ANTICOAGULATION CONSULT NOTE - Consult  Pharmacy Consult for Apixaban initiation Indication: RLE DVT  Allergies  Allergen Reactions   Amlodipine Swelling    Tongue swelling Other reaction(s): SWELLING   Levsin [Hyoscyamine] Anaphylaxis, Swelling and Rash   Beta Adrenergic Blockers Other (See Comments)    unknown   Hydralazine    Hydrochlorothiazide Other (See Comments)    "draws out too much fluid"   Other     Influenza virus vaccine tvs 2012-13 (18+) cell der   Zetia [Ezetimibe]     Muscle spasms, numbness, tingling     Patient Measurements: Height: 6\' 1"  (185.4 cm) Weight: 110 kg (242 lb 8.1 oz) IBW/kg (Calculated) : 79.9  Vital Signs: Temp: 97.9 F (36.6 C) (06/19 0251) Temp Source: Rectal (06/19 0251) BP: 168/102 (06/19 1300) Pulse Rate: 78 (06/19 1300)  Labs: Recent Labs    05/31/21 0015 05/31/21 0204  HGB 10.6*  --   HCT 33.2*  --   PLT 313  --   CREATININE 1.79*  --   TROPONINIHS 2 2    Estimated Creatinine Clearance: 45.6 mL/min (A) (by C-G formula based on SCr of 1.79 mg/dL (H)).   Medications:  No relevant AC/APT allergies and no AC PTA only ASA 81. Inpatient: ASA 81mg  QD, & Pt received single prophylactic dose of lovenox 6/19 0912.   Assessment: 75yo AAM w/ h/o CAD, GERD, HTN, HLD, hypothyroidism, VHD, presenting with syncope. LE US shows RLE calf DVT & CT chest negative for PE. No AC/APT PTA. Pharmacy consulted for the initiation of apixaban for new acute DVT.  D-Dimer 2.72; Hgb 10.6; Plts 313; trop 2  Goal of Therapy:  Monitor platelets by anticoagulation protocol: Yes   Plan:  Pt received single prophylactic dose of lovenox 6/19 0912. Stopped lovenox. Initiate apixaban 10mg  BID x7d; then 5mg  BID thereafter.  Shanon Brow Jenner Rosier 05/31/2021,1:59 PM

## 2021-05-31 NOTE — ED Triage Notes (Signed)
Pt's wife states pt was using the urinal tonight when he fainted. Per spouse lasted approx a few minutes. Pt states he feels like he is going to pass out.

## 2021-05-31 NOTE — ED Notes (Signed)
Pt assisted with transfer to hospital bed. No additional requests at this time.

## 2021-05-31 NOTE — ED Notes (Signed)
Pt given a cup of ice water

## 2021-05-31 NOTE — ED Notes (Signed)
Patient had questions about how soon he will be going to a room and if the MD ordered his Flonase. Dr. Sidney Ace was informed via text that patient requested Flonase. Wife remains at bedside and states she will take all the patient's belongings with her, including his cell phone and hearing aids.

## 2021-05-31 NOTE — Progress Notes (Signed)
PT Cancellation Note  Patient Details Name: Keith Williamson MRN: [id number] DOB: 06/02/1946   Cancelled Treatment:     Pt found to have new RLE DVT but only receiving prophylatic does of anticoagulation. Unable to reach MD via telephone nor epic chat and nurse reports pt has not received any additional anticoagulation. Will plan to hold PT evaluation at this time until pt can be assessed by MD and/or PT can discuss POC with MD.   Lavone Nian, PT, DPT 05/31/21, 1:16 PM    [name] 05/31/2021, 1:15 PM

## 2021-05-31 NOTE — H&P (Signed)
PATIENT NAME: Keith Williamson    MR#:  [id number]  DATE OF BIRTH:  06-Jul-1946  DATE OF ADMISSION:  05/31/2021  PRIMARY CARE PHYSICIAN: Tracie Harrier, MD   Patient is coming from: Home  REQUESTING/REFERRING PHYSICIAN: Ward, Delice Bison, DO  CHIEF COMPLAINT:   Chief Complaint  Patient presents with   Loss of Consciousness    HISTORY OF PRESENT ILLNESS:  Laurance Williamson is a 75 y.o. African-American male with medical history significant for coronary artery disease, GERD, hypertension, dyslipidemia, hypothyroidism, valvular heart disease, who presented to the emergency room with acute onset of syncope while sitting in his wheelchair and using his urinal.  The patient denied any chest pain or palpitations.  No nausea or vomiting or diaphoresis.  No fever or chills at home.  No cough or wheezing or hemoptysis.  He denied any paresthesias beyond his usual with his right hand or new muscle weakness.  No urinary or stool incontinence.  No witnessed seizures. ED Course: Initially in the ER, blood pressure was 157/99 with otherwise normal vital signs. Labs revealed mild hypokalemia of 3.4 and creatinine 1.79 with BUN of 23 compared to 16/1.3 on 04/30/2021.  Troponin I was 2 twice and BNP 13.7.  CBC showed mild anemia better than previous levels.  Influenza antigens and COVID-19 PCR came back negative.  UA showed 21-50 WBCs.   D-dimer came back elevated at 2.72.  EKG as reviewed by me : Showed sinus rhythm with a rate of 66 with first-degree AV block, left anterior fascicular block, incomplete right bundle blanch block and minimal voltage criteria for LVH and Q waves in V1. Imaging: Two-view chest x-ray showed inflation with atelectasis versus infiltrate in both lung bases. Chest CTA revealed no acute pulm embolism.  It showed low lung volumes with mild atelectasis and calcified coronary artery with aortic atherosclerosis.  The patient will be admitted to an observation  medically monitored bed for further evaluation and management. PAST MEDICAL HISTORY:   Past Medical History:  Diagnosis Date   Arthritis    BPH (benign prostatic hypertrophy)    CAD (coronary artery disease)    Diverticulosis    Enlarged prostate    GERD (gastroesophageal reflux disease)    Headache(784.0)    frequent sinus headache   Hurthle cell tumor    Hyperlipidemia    Hypertension    Hypothyroidism    MI (myocardial infarction) ([place]) 2014   inferior; s/p PCI with stent placement to RCA   OA (osteoarthritis)    Retina hole    Senile nuclear sclerosis    Thyroid cancer ([place])    iodine tx   Valvular heart disease     PAST SURGICAL HISTORY:   Past Surgical History:  Procedure Laterality Date   ANGIOPLASTY / STENTING FEMORAL     BACK SURGERY     COLONOSCOPY WITH PROPOFOL N/A 05/07/2016   Procedure: COLONOSCOPY WITH PROPOFOL;  Surgeon: Lollie Sails, MD;  Location: [facility] ENDOSCOPY;  Service: Endoscopy;  Laterality: N/A;   ESOPHAGOGASTRODUODENOSCOPY (EGD) WITH PROPOFOL N/A 05/07/2016   Procedure: ESOPHAGOGASTRODUODENOSCOPY (EGD) WITH PROPOFOL;  Surgeon: Lollie Sails, MD;  Location: [facility] ENDOSCOPY;  Service: Endoscopy;  Laterality: N/A;   GREEN LIGHT LASER TURP (TRANSURETHRAL RESECTION OF PROSTATE N/A 01/01/2021   Procedure: GREEN LIGHT LASER TURP (TRANSURETHRAL RESECTION OF PROSTATE;  Surgeon: Royston Cowper, MD;  Location: [facility] ORS;  Service: Urology;  Laterality: N/A;   KNEE ARTHROSCOPY Left    Medial and  lateral meniscectomy, partial   LUMBAR LAMINECTOMY  08/16/2012   L 3 4 & 5   LUMBAR MICRODISCECTOMY     L3-L4; with partial hemilaminectomy, partial facetectomy and foraminotomy   TOTAL THYROIDECTOMY     TURP VAPORIZATION      SOCIAL HISTORY:   Social History   Tobacco Use   Smoking status: Former    Pack years: 0.00    Types: Cigarettes    Quit date: 12/17/1981    Years since quitting: 39.4   Smokeless tobacco: Never  Substance Use Topics   Alcohol  use: No    FAMILY HISTORY:  No family history on file. No pertinent familial diseases. DRUG ALLERGIES:   Allergies  Allergen Reactions   Levsin [Hyoscyamine] Anaphylaxis, Swelling and Rash   Beta Adrenergic Blockers Other (See Comments)    unknown   Hydralazine    Hydrochlorothiazide Other (See Comments)    "draws out too much fluid"   Norvasc [Amlodipine] Swelling    Tongue swelling   Other     Influenza virus vaccine tvs 2012-13 (18+) cell der   Zetia [Ezetimibe]     Muscle spasms, numbness, tingling     REVIEW OF SYSTEMS:   ROS As per history of present illness. All pertinent systems were reviewed above. Constitutional, HEENT, cardiovascular, respiratory, GI, GU, musculoskeletal, neuro, psychiatric, endocrine, integumentary and hematologic systems were reviewed and are otherwise negative/unremarkable except for positive findings mentioned above in the HPI.   MEDICATIONS AT HOME:   Prior to Admission medications   Medication Sig Start Date End Date Taking? Authorizing Provider  allopurinol (ZYLOPRIM) 100 MG tablet Take 100 mg by mouth daily.    [provider]  Alpha-Lipoic Acid 600 MG CAPS Take 600 mg by mouth daily.    [provider]  aspirin 81 MG tablet Take 81 mg by mouth daily.    [provider]  candesartan (ATACAND) 32 MG tablet Take 32 mg by mouth daily.    [provider]  carvedilol (COREG) 3.125 MG tablet Take 3.125 mg by mouth 2 (two) times daily with a meal.    [provider]  diltiazem (CARDIZEM CD) 240 MG 24 hr capsule Take 240 mg by mouth at bedtime.    [provider]  docusate sodium (COLACE) 100 MG capsule Take 2 capsules (200 mg total) by mouth 2 (two) times daily. 01/01/21   Royston Cowper, MD  dutasteride (AVODART) 0.5 MG capsule Take 0.5 mg by mouth daily.    [provider]  gabapentin (NEURONTIN) 300 MG capsule Take 300 mg by mouth 2 (two) times daily.    [provider]   levothyroxine (SYNTHROID, LEVOTHROID) 137 MCG tablet Take 137 mcg by mouth daily before breakfast.    [provider]  Multiple Vitamins-Minerals (MULTIVITAMIN WITH MINERALS) tablet Take 1 tablet by mouth daily.    [provider]  nitroGLYCERIN (NITROSTAT) 0.4 MG SL tablet Place 0.4 mg under the tongue every 5 (five) minutes as needed for chest pain.    [provider]  silodosin (RAPAFLO) 8 MG CAPS capsule Take 8 mg by mouth every evening.    [provider]      VITAL SIGNS:  Blood pressure (!) 160/94, pulse 67, temperature 97.9 F (36.6 C), temperature source Rectal, resp. rate (!) 23, height 6\' 1"  (1.854 m), weight 110 kg, SpO2 99 %.  PHYSICAL EXAMINATION:  Physical Exam  GENERAL:  75 y.o.-year-old African-American male patient lying in the bed with no  acute distress.  EYES: Pupils equal, round, reactive to light and accommodation. No scleral icterus. Extraocular muscles intact.  HEENT: Head atraumatic, normocephalic. Oropharynx and nasopharynx clear.  NECK:  Supple, no jugular venous distention. No thyroid enlargement, no tenderness.  LUNGS: Normal breath sounds bilaterally, no wheezing, rales,rhonchi or crepitation. No use of accessory muscles of respiration.  CARDIOVASCULAR: Regular rate and rhythm, S1, S2 normal. No murmurs, rubs, or gallops.  ABDOMEN: Soft, nondistended, nontender. Bowel sounds present. No organomegaly or mass.  EXTREMITIES: Bilateral 1+ lower extremity pitting edema with no cyanosis, or clubbing.  NEUROLOGIC: Cranial nerves II through XII are intact. Muscle strength 5/5 in all extremities. Sensation intact. Gait not checked.  PSYCHIATRIC: The patient is alert and oriented x 3.  Normal affect and good eye contact. SKIN: No obvious rash, lesion, or ulcer.   LABORATORY PANEL:   CBC Recent Labs  Lab 05/31/21 0015  WBC 7.9  HGB 10.6*  HCT 33.2*  PLT 313    ------------------------------------------------------------------------------------------------------------------  Chemistries  Recent Labs  Lab 05/31/21 0015  NA 135  K 3.4*  CL 101  CO2 28  GLUCOSE 115*  BUN 23  CREATININE 1.79*  CALCIUM 9.3   ------------------------------------------------------------------------------------------------------------------  Cardiac Enzymes No results for input(s): TROPONINI in the last 168 hours. ------------------------------------------------------------------------------------------------------------------  RADIOLOGY:  DG Chest 2 View  Result Date: 05/31/2021 CLINICAL DATA:  Shortness of breath. Fainted. Feels like going to pass out. EXAM: CHEST - 2 VIEW COMPARISON:  06/24/2013 FINDINGS: Shallow inspiration with infiltration or atelectasis in both lung bases. Heart size and pulmonary vascularity are normal for technique. No pleural effusions. No pneumothorax. Mediastinal contours appear intact. Calcification of the aorta. Degenerative changes in the spine. IMPRESSION: Shallow inspiration with infiltration or atelectasis in both lung bases. Electronically Signed   By: Lucienne Capers M.D.   On: 05/31/2021 01:28   CT Angio Chest PE W and/or Wo Contrast  Result Date: 05/31/2021 CLINICAL DATA:  75 year old male with shortness of breath. EXAM: CT ANGIOGRAPHY CHEST WITH CONTRAST TECHNIQUE: Multidetector CT imaging of the chest was performed using the standard protocol during bolus administration of intravenous contrast. Multiplanar CT image reconstructions and MIPs were obtained to evaluate the vascular anatomy. CONTRAST:  42mL OMNIPAQUE IOHEXOL 350 MG/ML SOLN COMPARISON:  Chest radiographs 0113 hours today. CT Abdomen and Pelvis 07/22/2017. Chest CT 02/03/2007. FINDINGS: Cardiovascular: Adequate contrast bolus timing in the pulmonary arterial tree. No focal filling defect identified in the pulmonary arteries to suggest acute pulmonary embolism.  Calcified coronary artery atherosclerosis and/or stents on series 5, image 178. Lower lung volumes which might accentuate cardiac size, but borderline to mild cardiomegaly appears new since 2008. No pericardial effusion. Negative visible aorta aside from atherosclerosis. Mediastinum/Nodes: Negative.  No lymphadenopathy. Lungs/Pleura: Major airways are patent. Lower lung volumes compared to 2008 with mild dependent atelectasis bilaterally. Lungs are otherwise negative. No pleural effusion. Upper Abdomen: Negative visible liver, gallbladder, spleen, pancreas, adrenal glands, kidneys and bowel in the upper abdomen. Musculoskeletal: Flowing anterior endplate osteophytes in the thoracic spine resulting in multiple levels of interbody ankylosis. No acute osseous abnormality identified. Review of the MIP images confirms the above findings. IMPRESSION: 1. Negative for acute pulmonary embolus. 2. Lower lung volumes with mild atelectasis. 3. Calcified coronary artery and Aortic Atherosclerosis (ICD10-I70.0). Electronically Signed   By: Genevie Ann M.D.   On: 05/31/2021 04:35      IMPRESSION AND PLAN:  Active Problems:   Syncope  1.  Syncope. -The patient will be admitted to an observation medically  monitored bed. - Will check orthostatics q 12 hours. - Will obtain a bilateral carotid Doppler and 2D echo. - The patient will be gently hydrated with IV normal saline and monitored for arrhythmias. Differential diagnosis would include neurally mediated syncope especially given incidents during urination, cardiogenic, arrhythmias related,  orthostatic hypotension and less likely hypoglycemia.  2.  Elevated D-dimer. - We will obtain lateral lower extremity venous Doppler especially given lower extremity edema rule out DVT. - The patient was ruled out acute pulmonary embolism.   3.  Essential hypertension. - We will continue Coreg and Atacand as well as Cardizem CD.  4.  Coronary artery disease. - We will continue  aspirin, Coreg and ARB therapy as well as as needed sublingual nitroglycerin.  5.  Hypothyroidism. - We will continue Synthroid.  6.  BPH. - We will continue Avodart.  7.  Gout. - We will continue allopurinol   DVT prophylaxis: Lovenox.  Code Status: full code.  Family Communication:  The plan of care was discussed in details with the patient (and family). I answered all questions. The patient agreed to proceed with the above mentioned plan. Further management will depend upon hospital course. Disposition Plan: Back to previous home environment Consults called: none.  All the records are reviewed and case discussed with ED provider.  Status is: Observation  The patient remains OBS appropriate and will d/c before 2 midnights.  Dispo: The patient is from: Home              Anticipated d/c is to: Home              Patient currently is not medically stable to d/c.   Difficult to place patient No   TOTAL TIME TAKING CARE OF THIS PATIENT: 55 minutes.    Christel Mormon M.D on 05/31/2021 at 5:03 AM  [facility]   From 7 PM-7 AM, contact night-coverage [email or web address]  CC: Primary care physician; Tracie Harrier, MD

## 2021-05-31 NOTE — ED Notes (Signed)
Report given to [employer] RN

## 2021-05-31 NOTE — Progress Notes (Signed)
PHARMACIST - PHYSICIAN ORDER COMMUNICATION  CONCERNING: P&T Medication Policy on Herbal Medications  DESCRIPTION:  This patient's order for:  Alpha-Lipoic Acid CAPS 600 mg  has been noted.  This product(s) is classified as an "herbal" or natural product. Due to a lack of definitive safety studies or FDA approval, nonstandard manufacturing practices, plus the potential risk of unknown drug-drug interactions while on inpatient medications, the Pharmacy and Therapeutics Committee does not permit the use of "herbal" or natural products of this type within [facility].   ACTION TAKEN: The pharmacy department is unable to verify this order at this time. Please reevaluate patient's clinical condition at discharge and address if the herbal or natural product(s) should be resumed at that time.   Renda Rolls, PharmD, [facility] 05/31/2021 6:04 AM

## 2021-05-31 NOTE — Progress Notes (Signed)
PT Cancellation Note  Patient Details Name: Keith Williamson MRN: [id number] DOB: [birth year]   Cancelled Treatment:    Reason Eval/Treat Not Completed: Patient not medically ready. PT orders received, chart reviewed. Pt received in bed with wife present with them reporting new findings of R LE DVT. Will hold PT evaluation until MD can assess pt & pt is medically appropriate for PT intervention.  Lavone Nian, PT, DPT 05/31/21, 9:14 AM    [name] 05/31/2021, 9:13 AM
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
## 2021-05-31 NOTE — Progress Notes (Signed)
PHARMACIST - PHYSICIAN COMMUNICATION  CONCERNING:  Enoxaparin (Lovenox) for DVT Prophylaxis    RECOMMENDATION: Patient was prescribed enoxaprin 40mg  q24 hours for VTE prophylaxis.   Filed Weights   05/31/21 0007  Weight: 110 kg (242 lb 8.1 oz)    Body mass index is 31.99 kg/m.  Estimated Creatinine Clearance: 45.6 mL/min (A) (by C-G formula based on SCr of 1.79 mg/dL (H)).   Based on [place] patient is candidate for enoxaparin 0.5mg /kg TBW SQ every 24 hours based on BMI being >30.  DESCRIPTION: Pharmacy has adjusted enoxaparin dose per [facility] policy.  Patient is now receiving enoxaparin 0.5 mg/kg every 24 hours   Renda Rolls, PharmD, [facility] 05/31/2021 5:21 AM

## 2021-05-31 NOTE — Progress Notes (Signed)
PROGRESS NOTE    Keith Williamson  VEH:[id number] DOB: 31-Oct-1946 DOA: 05/31/2021 PCP: Tracie Harrier, MD   Chief complaint.  Syncope. Brief Narrative:  Kimon Loewen is a 75 y.o. African-American male with medical history significant for coronary artery disease, GERD, hypertension, dyslipidemia, hypothyroidism, valvular heart disease, who presented to the emergency room with acute onset of syncope while sitting in his wheelchair and using his urinal. Patient has elevated D-dimer, CT angiogram chest did not show any PE.  Duplex ultrasound showed a right lower extremity DVT below the knee.  Patient had a recent hip surgery less than 2 months ago.   Assessment & Plan:   Active Problems:   Syncope  #1.  Syncope and collapse. Spoke with the patient and wife, the incident was witnessed by wife, patient was sitting in a chair, sudden loss consciousness.  No seizure activity.  Telemetry so far has not arrhythmia.  EKG reviewed, showed first degree AV block.  I will continue telemetry monitoring.  Obtain echocardiogram.  Most likely will need an event recorder at time of discharge.  #2.  Right lower extremity DVT. DVT was below the knee, according to guideline, it does not require treatment until patient is asymptomatic.  Discussed with the patient and wife, they feel that patient has significant leg swelling, affecting their life, they wish to be treated.  We will start Eliquis and treated for 3 months.  This is postop related DVT.  #3 essential hypertension plan Continue home medicines per  4.  Hypothyroidism.  5.  Chronic kidney disease stage IIIa Hypokalemia. Supplement potassium orally.  Slightly worsening renal function today.  Will follow.  DVT prophylaxis: Eliquis Code Status: full Family Communication: Wife at bedside. Disposition Plan:    Status is: Observation  The patient remains OBS appropriate and will d/c before 2 midnights.  Dispo: The patient is from: Home               Anticipated d/c is to: Home              Patient currently is not medically stable to d/c.   Difficult to place patient No        I/O last 3 completed shifts: In: 500 [IV Piggyback:500] Out: -  No intake/output data recorded.     Consultants:  None  Procedures: None  Antimicrobials: None  Subjective: Patient doing well today.  He does not have any dizziness or lightheadedness.  No confusion. He has some right leg swelling and some pain. Denies any fever or chills. No abdominal pain or nausea vomiting. No short of breath or cough. No chest pain or palpitation.   Objective: Vitals:   05/31/21 1030 05/31/21 1100 05/31/21 1140 05/31/21 1300  BP: (!) 155/99 (!) 163/104 (!) 169/97 (!) 168/102  Pulse:   77 78  Resp: 13 12 19  (!) 21  Temp:      TempSrc:      SpO2:   98% 99%  Weight:      Height:        Intake/Output Summary (Last 24 hours) at 05/31/2021 1351 Last data filed at 05/31/2021 0248 Gross per 24 hour  Intake 500 ml  Output --  Net 500 ml   Filed Weights   05/31/21 0007  Weight: 110 kg    Examination:  General exam: Appears calm and comfortable  Respiratory system: Clear to auscultation. Respiratory effort normal. Cardiovascular system: S1 & S2 heard, RRR. No JVD, murmurs, rubs, gallops or clicks.  Gastrointestinal system:  Abdomen is nondistended, soft and nontender. No organomegaly or masses felt. Normal bowel sounds heard. Central nervous system: Alert and oriented. No focal neurological deficits. Extremities: Right leg swelling. Skin: No rashes, lesions or ulcers Psychiatry: Judgement and insight appear normal. Mood & affect appropriate.     Data Reviewed: I have personally reviewed following labs and imaging studies  CBC: Recent Labs  Lab 05/31/21 0015  WBC 7.9  HGB 10.6*  HCT 33.2*  MCV 86.2  PLT 403   Basic Metabolic Panel: Recent Labs  Lab 05/31/21 0015  NA 135  K 3.4*  CL 101  CO2 28  GLUCOSE 115*  BUN 23   CREATININE 1.79*  CALCIUM 9.3   GFR: Estimated Creatinine Clearance: 45.6 mL/min (A) (by C-G formula based on SCr of 1.79 mg/dL (H)). Liver Function Tests: No results for input(s): AST, ALT, ALKPHOS, BILITOT, PROT, ALBUMIN in the last 168 hours. No results for input(s): LIPASE, AMYLASE in the last 168 hours. No results for input(s): AMMONIA in the last 168 hours. Coagulation Profile: No results for input(s): INR, PROTIME in the last 168 hours. Cardiac Enzymes: No results for input(s): CKTOTAL, CKMB, CKMBINDEX, TROPONINI in the last 168 hours. BNP (last 3 results) No results for input(s): PROBNP in the last 8760 hours. HbA1C: No results for input(s): HGBA1C in the last 72 hours. CBG: No results for input(s): GLUCAP in the last 168 hours. Lipid Profile: No results for input(s): CHOL, HDL, LDLCALC, TRIG, CHOLHDL, LDLDIRECT in the last 72 hours. Thyroid Function Tests: No results for input(s): TSH, T4TOTAL, FREET4, T3FREE, THYROIDAB in the last 72 hours. Anemia Panel: No results for input(s): VITAMINB12, FOLATE, FERRITIN, TIBC, IRON, RETICCTPCT in the last 72 hours. Sepsis Labs: No results for input(s): PROCALCITON, LATICACIDVEN in the last 168 hours.  Recent Results (from the past 240 hour(s))  Resp Panel by RT-PCR (Flu A&B, Covid) Nasopharyngeal Swab     Status: None   Collection Time: 05/31/21  2:04 AM   Specimen: Nasopharyngeal Swab; Nasopharyngeal(NP) swabs in vial transport medium  Result Value Ref Range Status   SARS Coronavirus 2 by RT PCR NEGATIVE NEGATIVE Final    Comment: (NOTE) SARS-CoV-2 target nucleic acids are NOT DETECTED.  The SARS-CoV-2 RNA is generally detectable in upper respiratory specimens during the acute phase of infection. The lowest concentration of SARS-CoV-2 viral copies this assay can detect is 138 copies/mL. A negative result does not preclude SARS-Cov-2 infection and should not be used as the sole basis for treatment or other patient management  decisions. A negative result may occur with  improper specimen collection/handling, submission of specimen other than nasopharyngeal swab, presence of viral mutation(s) within the areas targeted by this assay, and inadequate number of viral copies(<138 copies/mL). A negative result must be combined with clinical observations, patient history, and epidemiological information. The expected result is Negative.  Fact Sheet for Patients:  EntrepreneurPulse.com.au  Fact Sheet for Healthcare Providers:  IncredibleEmployment.be  This test is no t yet approved or cleared by the [place] FDA and  has been authorized for detection and/or diagnosis of SARS-CoV-2 by FDA under an Emergency Use Authorization (EUA). This EUA will remain  in effect (meaning this test can be used) for the duration of the COVID-19 declaration under Section 564(b)(1) of the Act, 21 U.S.C.section 360bbb-3(b)(1), unless the authorization is terminated  or revoked sooner.       Influenza A by PCR NEGATIVE NEGATIVE Final   Influenza B by PCR NEGATIVE NEGATIVE Final    Comment: (  NOTE) The Xpert Xpress SARS-CoV-2/FLU/RSV plus assay is intended as an aid in the diagnosis of influenza from Nasopharyngeal swab specimens and should not be used as a sole basis for treatment. Nasal washings and aspirates are unacceptable for Xpert Xpress SARS-CoV-2/FLU/RSV testing.  Fact Sheet for Patients: EntrepreneurPulse.com.au  Fact Sheet for Healthcare Providers: IncredibleEmployment.be  This test is not yet approved or cleared by the [place] FDA and has been authorized for detection and/or diagnosis of SARS-CoV-2 by FDA under an Emergency Use Authorization (EUA). This EUA will remain in effect (meaning this test can be used) for the duration of the COVID-19 declaration under Section 564(b)(1) of the Act, 21 U.S.C. section 360bbb-3(b)(1), unless the  authorization is terminated or revoked.  Performed at [facility], [address], [place], [place] [address]          Radiology Studies: DG Chest 2 View  Result Date: 05/31/2021 CLINICAL DATA:  Shortness of breath. Fainted. Feels like going to pass out. EXAM: CHEST - 2 VIEW COMPARISON:  06/24/2013 FINDINGS: Shallow inspiration with infiltration or atelectasis in both lung bases. Heart size and pulmonary vascularity are normal for technique. No pleural effusions. No pneumothorax. Mediastinal contours appear intact. Calcification of the aorta. Degenerative changes in the spine. IMPRESSION: Shallow inspiration with infiltration or atelectasis in both lung bases. Electronically Signed   By: Lucienne Capers M.D.   On: 05/31/2021 01:28   CT Angio Chest PE W and/or Wo Contrast  Result Date: 05/31/2021 CLINICAL DATA:  75 year old male with shortness of breath. EXAM: CT ANGIOGRAPHY CHEST WITH CONTRAST TECHNIQUE: Multidetector CT imaging of the chest was performed using the standard protocol during bolus administration of intravenous contrast. Multiplanar CT image reconstructions and MIPs were obtained to evaluate the vascular anatomy. CONTRAST:  18mL OMNIPAQUE IOHEXOL 350 MG/ML SOLN COMPARISON:  Chest radiographs 0113 hours today. CT Abdomen and Pelvis 07/22/2017. Chest CT 02/03/2007. FINDINGS: Cardiovascular: Adequate contrast bolus timing in the pulmonary arterial tree. No focal filling defect identified in the pulmonary arteries to suggest acute pulmonary embolism. Calcified coronary artery atherosclerosis and/or stents on series 5, image 178. Lower lung volumes which might accentuate cardiac size, but borderline to mild cardiomegaly appears new since 2008. No pericardial effusion. Negative visible aorta aside from atherosclerosis. Mediastinum/Nodes: Negative.  No lymphadenopathy. Lungs/Pleura: Major airways are patent. Lower lung volumes compared to 2008 with mild dependent atelectasis  bilaterally. Lungs are otherwise negative. No pleural effusion. Upper Abdomen: Negative visible liver, gallbladder, spleen, pancreas, adrenal glands, kidneys and bowel in the upper abdomen. Musculoskeletal: Flowing anterior endplate osteophytes in the thoracic spine resulting in multiple levels of interbody ankylosis. No acute osseous abnormality identified. Review of the MIP images confirms the above findings. IMPRESSION: 1. Negative for acute pulmonary embolus. 2. Lower lung volumes with mild atelectasis. 3. Calcified coronary artery and Aortic Atherosclerosis (ICD10-I70.0). Electronically Signed   By: Genevie Ann M.D.   On: 05/31/2021 04:35   US Carotid Bilateral  Result Date: 05/31/2021 CLINICAL DATA:  75 year old male with syncope. EXAM: BILATERAL CAROTID DUPLEX ULTRASOUND TECHNIQUE: Pearline Cables scale imaging, color Doppler and duplex ultrasound were performed of bilateral carotid and vertebral arteries in the neck. COMPARISON:  Neck CT 09/05/2007. FINDINGS: Criteria: Quantification of carotid stenosis is based on velocity parameters that correlate the residual internal carotid diameter with NASCET-based stenosis levels, using the diameter of the distal internal carotid lumen as the denominator for stenosis measurement. The following velocity measurements were obtained: RIGHT ICA: 45 cm/sec CCA: 62 cm/sec SYSTOLIC ICA/CCA RATIO:  0.7  ECA: 67 cm/sec LEFT ICA: 50 cm/sec CCA: 67 cm/sec SYSTOLIC ICA/CCA RATIO:  0.7 ECA: 75 cm/sec RIGHT CAROTID ARTERY: Minimal atherosclerosis, similar to the 2008 CT. RIGHT VERTEBRAL ARTERY:  Patent with antegrade flow. LEFT CAROTID ARTERY: Minimal to mild atherosclerosis similar to the 2008 CT. Mildly tortuous left ICA. LEFT VERTEBRAL ARTERY:  Patent with antegrade flow. IMPRESSION: Mild cervical carotid atherosclerosis with no Doppler evidence of stenosis. Electronically Signed   By: Genevie Ann M.D.   On: 05/31/2021 07:20   US Venous Img Lower Bilateral (DVT)  Addendum Date: 05/31/2021    ADDENDUM REPORT: 05/31/2021 07:24 ADDENDUM: Study discussed by telephone with Dr. Charna Archer on 05/31/2021 at 0721 hours. Electronically Signed   By: Genevie Ann M.D.   On: 05/31/2021 07:24   Result Date: 05/31/2021 CLINICAL DATA:  75 year old male with lower extremity edema. Abnormal D-dimer. EXAM: BILATERAL LOWER EXTREMITY VENOUS DOPPLER ULTRASOUND TECHNIQUE: Gray-scale sonography with compression, as well as color and duplex ultrasound, were performed to evaluate the deep venous system(s) from the level of the common femoral vein through the popliteal and proximal calf veins. COMPARISON:  None. FINDINGS: RIGHT: Normal compressibility of the common femoral, superficial femoral, and popliteal veins. Visualized portions of profunda femoral vein and great saphenous vein unremarkable. But they a right calf veins are incompressible with hypoechoic thrombus (image 22, 25). Posterior tibial and peroneal vessels are affected. No other filling defects to suggest DVT on grayscale or color Doppler imaging. LEFT: Normal compressibility of the common femoral, superficial femoral, and popliteal veins, as well as the visualized calf veins. Visualized portions of profunda femoral vein and great saphenous vein unremarkable. No filling defects to suggest DVT on grayscale or color Doppler imaging. Doppler waveforms show normal direction of venous flow, normal respiratory plasticity and response to augmentation. OTHER None. Limitations: none IMPRESSION: 1. Positive for Right Calf DVT. 2. Negative for left lower extremity deep venous thrombosis. Electronically Signed: By: Genevie Ann M.D. On: 05/31/2021 07:16        Scheduled Meds:  allopurinol  100 mg Oral Daily   aspirin EC  81 mg Oral Daily   carvedilol  3.125 mg Oral BID WC   diltiazem  240 mg Oral QHS   docusate sodium  200 mg Oral BID   dutasteride  0.5 mg Oral Daily   enoxaparin (LOVENOX) injection  0.5 mg/kg Subcutaneous Q24H   gabapentin  300 mg Oral BID   irbesartan   37.5 mg Oral Daily   levothyroxine  137 mcg Oral QAC breakfast   multivitamin with minerals  1 tablet Oral Daily   sodium chloride flush  3 mL Intravenous Q12H   Continuous Infusions:   LOS: 0 days    Time spent: no charge    Sharen Hones, MD [facility]   To contact the attending provider between 7A-7P or the covering provider during after hours 7P-7A, please log into the web site [email or web address] and access using universal [place] password for that web site. If you do not have the password, please call the hospital operator.  05/31/2021, 1:51 PM

## 2021-05-31 NOTE — ED Notes (Signed)
Patient is alert and oriented. Family at bedside. Introduced self to family. Pain assessed.

## 2021-06-01 DIAGNOSIS — I5033 Acute on chronic diastolic (congestive) heart failure: Secondary | ICD-10-CM | POA: Diagnosis not present

## 2021-06-01 DIAGNOSIS — R55 Syncope and collapse: Secondary | ICD-10-CM | POA: Diagnosis not present

## 2021-06-01 DIAGNOSIS — I824Z1 Acute embolism and thrombosis of unspecified deep veins of right distal lower extremity: Secondary | ICD-10-CM

## 2021-06-01 LAB — CBC
HCT: 33.7 % — ABNORMAL LOW (ref 39.0–52.0)
Hemoglobin: 10.8 g/dL — ABNORMAL LOW (ref 13.0–17.0)
MCH: 27.8 pg (ref 26.0–34.0)
MCHC: 32 g/dL (ref 30.0–36.0)
MCV: 86.6 fL (ref 80.0–100.0)
Platelets: 288 10*3/uL (ref 150–400)
RBC: 3.89 MIL/uL — ABNORMAL LOW (ref 4.22–5.81)
RDW: 14.9 % (ref 11.5–15.5)
WBC: 7.3 10*3/uL (ref 4.0–10.5)
nRBC: 0 % (ref 0.0–0.2)

## 2021-06-01 LAB — BASIC METABOLIC PANEL
Anion gap: 6 (ref 5–15)
BUN: 17 mg/dL (ref 8–23)
CO2: 27 mmol/L (ref 22–32)
Calcium: 9.1 mg/dL (ref 8.9–10.3)
Chloride: 104 mmol/L (ref 98–111)
Creatinine, Ser: 1.23 mg/dL (ref 0.61–1.24)
GFR, Estimated: >60 mL/min (ref >60)
Glucose, Bld: 91 mg/dL (ref 70–99)
Potassium: 3.9 mmol/L (ref 3.5–5.1)
Sodium: 137 mmol/L (ref 135–145)

## 2021-06-01 LAB — GLUCOSE, CAPILLARY
Glucose-Capillary: 94 mg/dL (ref 70–99)
Glucose-Capillary: 89 mg/dL (ref 70–99)

## 2021-06-01 LAB — MAGNESIUM: Magnesium: 2.2 mg/dL (ref 1.7–2.4)

## 2021-06-01 MED ORDER — APIXABAN 5 MG PO TABS: 5.0000 mg | ORAL_TABLET | Freq: Two times a day (BID) | ORAL | 0 refills | Status: DC

## 2021-06-01 MED ORDER — APIXABAN 5 MG PO TABS
10.0000 mg | ORAL_TABLET | Freq: Two times a day (BID) | ORAL | 0 refills | Status: DC
Start: 2021-06-01 — End: 2021-08-06

## 2021-06-01 MED ORDER — OXYCODONE-ACETAMINOPHEN 5-325 MG PO TABS
1.0000 | ORAL_TABLET | Freq: Four times a day (QID) | ORAL | Status: DC | PRN
  Administered 2021-06-01: 10:00:00 1 via ORAL
  Filled 2021-06-01: qty 1
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
## 2021-06-01 NOTE — Progress Notes (Signed)
Received Md order to discharge patient to home and continue OP PT, reviewed homes meds, prescriptions and follow up appointments with patient and patient verbalized understanding

## 2021-06-01 NOTE — Discharge Summary (Signed)
Physician Discharge Summary  Patient ID: Keith Williamson MRN: [id number] DOB/AGE: February 16, 1946 75 y.o.  Admit date: 05/31/2021 Discharge date: 06/01/2021  Admission Diagnoses:  Discharge Diagnoses:  Active Problems:   Syncope   Right leg DVT [facility])   Discharged Condition: good  Hospital Course: Anay Rathe is a 75 y.o. African-American male with medical history significant for coronary artery disease, GERD, hypertension, dyslipidemia, hypothyroidism, valvular heart disease, who presented to the emergency room with acute onset of syncope while sitting in his wheelchair and using his urinal. Patient has elevated D-dimer, CT angiogram chest did not show any PE.  Duplex ultrasound showed a right lower extremity DVT below the knee.  Patient had a recent hip surgery less than 2 months ago.    #1.  Syncope and collapse. First-degree AV block. RBBB  LAFB Spoke with the patient and wife, the incident was witnessed by wife, patient was sitting in a chair, sudden loss consciousness.  No seizure activity.  Telemetry so far has not arrhythmia.  EKG reviewed, showed first degree AV block, RBBB and LAFB.  Echocardiogram did not show significant abnormality.  Normal ejection fraction.  Cardiology consult was obtained, Dr. Nehemiah Massed seen the patient, believe that that could be due to medication effect.  At this point, I will discontinue diltiazem for now.  Patient has scheduled follow-up with cardiology in 1 week.   #2.  Right lower extremity DVT. DVT was below the knee, according to guideline, it does not require treatment until patient is symptomatic.  Discussed with the patient and wife, they feel that patient has significant leg swelling and pain, affecting his daily living, they wish to be treated.  We will start Eliquis and treated for 3 months.  This is postop related DVT.   #3 essential hypertension  Continue home medicines, but discontinue the diltiazem.  4.  Hypothyroidism.   5.  Chronic  kidney disease stage IIIa Hypokalemia. Supplement potassium orally.  Renal function better.  Consults: cardiology  Significant Diagnostic Studies:  Echo: 1. Left ventricular ejection fraction, by estimation, is 60 to 65%. The left ventricle has normal function. The left ventricle has no regional wall motion abnormalities. There is moderate left ventricular hypertrophy. Left ventricular diastolic parameters are consistent with Grade I diastolic dysfunction (impaired relaxation). 2. Right ventricular systolic function is normal. The right ventricular size is normal. There is normal pulmonary artery systolic pressure. The estimated right ventricular systolic pressure is 95.2 mmHg. 3. The mitral valve is normal in structure. Mild mitral valve regurgitation. No evidence of mitral stenosis.  BILATERAL CAROTID DUPLEX ULTRASOUND   TECHNIQUE: Pearline Cables scale imaging, color Doppler and duplex ultrasound were performed of bilateral carotid and vertebral arteries in the neck.   COMPARISON:  Neck CT 09/05/2007.   FINDINGS: Criteria: Quantification of carotid stenosis is based on velocity parameters that correlate the residual internal carotid diameter with NASCET-based stenosis levels, using the diameter of the distal internal carotid lumen as the denominator for stenosis measurement.   The following velocity measurements were obtained:   RIGHT   ICA: 45 cm/sec   CCA: 62 cm/sec   SYSTOLIC ICA/CCA RATIO:  0.7   ECA: 67 cm/sec   LEFT   ICA: 50 cm/sec   CCA: 67 cm/sec   SYSTOLIC ICA/CCA RATIO:  0.7   ECA: 75 cm/sec   RIGHT CAROTID ARTERY: Minimal atherosclerosis, similar to the 2008 CT.   RIGHT VERTEBRAL ARTERY:  Patent with antegrade flow.   LEFT CAROTID ARTERY: Minimal to mild atherosclerosis similar to the  2008 CT. Mildly tortuous left ICA.   LEFT VERTEBRAL ARTERY:  Patent with antegrade flow.   IMPRESSION: Mild cervical carotid atherosclerosis with no Doppler evidence  of stenosis.     Electronically Signed   By: Genevie Ann M.D.   On: 05/31/2021 07:20   BILATERAL LOWER EXTREMITY VENOUS DOPPLER ULTRASOUND   TECHNIQUE: Gray-scale sonography with compression, as well as color and duplex ultrasound, were performed to evaluate the deep venous system(s) from the level of the common femoral vein through the popliteal and proximal calf veins.   COMPARISON:  None.   FINDINGS: RIGHT:   Normal compressibility of the common femoral, superficial femoral, and popliteal veins. Visualized portions of profunda femoral vein and great saphenous vein unremarkable.   But they a right calf veins are incompressible with hypoechoic thrombus (image 22, 25). Posterior tibial and peroneal vessels are affected.   No other filling defects to suggest DVT on grayscale or color Doppler imaging.   LEFT:   Normal compressibility of the common femoral, superficial femoral, and popliteal veins, as well as the visualized calf veins. Visualized portions of profunda femoral vein and great saphenous vein unremarkable. No filling defects to suggest DVT on grayscale or color Doppler imaging. Doppler waveforms show normal direction of venous flow, normal respiratory plasticity and response to augmentation.   OTHER   None.   Limitations: none   IMPRESSION: 1. Positive for Right Calf DVT. 2. Negative for left lower extremity deep venous thrombosis.   Electronically Signed: By: Genevie Ann M.D. On: 05/31/2021 07:16  CT ANGIOGRAPHY CHEST WITH CONTRAST   TECHNIQUE: Multidetector CT imaging of the chest was performed using the standard protocol during bolus administration of intravenous contrast. Multiplanar CT image reconstructions and MIPs were obtained to evaluate the vascular anatomy.   CONTRAST:  46mL OMNIPAQUE IOHEXOL 350 MG/ML SOLN   COMPARISON:  Chest radiographs 0113 hours today. CT Abdomen and Pelvis 07/22/2017. Chest CT 02/03/2007.    FINDINGS: Cardiovascular: Adequate contrast bolus timing in the pulmonary arterial tree.   No focal filling defect identified in the pulmonary arteries to suggest acute pulmonary embolism.   Calcified coronary artery atherosclerosis and/or stents on series 5, image 178. Lower lung volumes which might accentuate cardiac size, but borderline to mild cardiomegaly appears new since 2008. No pericardial effusion. Negative visible aorta aside from atherosclerosis.   Mediastinum/Nodes: Negative.  No lymphadenopathy.   Lungs/Pleura: Major airways are patent. Lower lung volumes compared to 2008 with mild dependent atelectasis bilaterally. Lungs are otherwise negative. No pleural effusion.   Upper Abdomen: Negative visible liver, gallbladder, spleen, pancreas, adrenal glands, kidneys and bowel in the upper abdomen.   Musculoskeletal: Flowing anterior endplate osteophytes in the thoracic spine resulting in multiple levels of interbody ankylosis. No acute osseous abnormality identified.   Review of the MIP images confirms the above findings.   IMPRESSION: 1. Negative for acute pulmonary embolus. 2. Lower lung volumes with mild atelectasis. 3. Calcified coronary artery and Aortic Atherosclerosis (ICD10-I70.0).     Electronically Signed   By: Genevie Ann M.D.   On: 05/31/2021 04:35   Treatments: eliquis  Discharge Exam: Blood pressure 130/82, pulse 66, temperature 97.8 F (36.6 C), resp. rate 18, height 6\' 2"  (1.88 m), weight 104.4 kg, SpO2 98 %. General appearance: alert and cooperative Resp: clear to auscultation bilaterally Cardio: regular rate and rhythm, S1, S2 normal, no murmur, click, rub or gallop GI: soft, non-tender; bowel sounds normal; no masses,  no organomegaly Extremities: Right ankle edema  Disposition:  Discharge disposition: 01-Home or Self Care       Discharge Instructions     Diet - low sodium heart healthy   Complete by: As directed    Increase  activity slowly   Complete by: As directed       Allergies as of 06/01/2021       Reactions   Amlodipine Swelling   Tongue swelling Other reaction(s): SWELLING   Levsin [hyoscyamine] Anaphylaxis, Swelling, Rash   Beta Adrenergic Blockers Other (See Comments)   unknown   Hydralazine    Hydrochlorothiazide Other (See Comments)   "draws out too much fluid"   Other    Influenza virus vaccine tvs 2012-13 (18+) cell der   Zetia [ezetimibe]    Muscle spasms, numbness, tingling         Medication List     STOP taking these medications    Alpha-Lipoic Acid 600 MG Caps   diltiazem 240 MG 24 hr capsule Commonly known as: CARDIZEM CD   silodosin 8 MG Caps capsule Commonly known as: RAPAFLO       TAKE these medications    allopurinol 100 MG tablet Commonly known as: ZYLOPRIM Take 100 mg by mouth daily.   apixaban 5 MG Tabs tablet Commonly known as: ELIQUIS Take 2 tablets (10 mg total) by mouth 2 (two) times daily for 6 days.   apixaban 5 MG Tabs tablet Commonly known as: ELIQUIS Take 1 tablet (5 mg total) by mouth 2 (two) times daily. Start taking on: June 07, 2021   candesartan 32 MG tablet Commonly known as: ATACAND Take 32 mg by mouth daily.   carvedilol 3.125 MG tablet Commonly known as: COREG Take 3.125 mg by mouth 2 (two) times daily with a meal.   docusate sodium 100 MG capsule Commonly known as: COLACE Take 2 capsules (200 mg total) by mouth 2 (two) times daily.   dutasteride 0.5 MG capsule Commonly known as: AVODART Take 0.5 mg by mouth daily.   fluticasone 50 MCG/ACT nasal spray Commonly known as: FLONASE Place 2 sprays into both nostrils daily.   gabapentin 300 MG capsule Commonly known as: NEURONTIN Take 300 mg by mouth 2 (two) times daily.   levothyroxine 137 MCG tablet Commonly known as: SYNTHROID Take 137 mcg by mouth daily before breakfast.   multivitamin with minerals tablet Take 1 tablet by mouth daily.   nitroGLYCERIN 0.4 MG  SL tablet Commonly known as: NITROSTAT Place 0.4 mg under the tongue every 5 (five) minutes as needed for chest pain.   rosuvastatin 40 MG tablet Commonly known as: CRESTOR Take 40 mg by mouth daily.       ASK your doctor about these medications    aspirin 81 MG tablet Take 81 mg by mouth daily.        Follow-up Information     Corey Skains, MD Follow up in 1 week(s).   Specialty: Cardiology Contact information: [facility] [place] [address] [phone]         Tracie Harrier, MD Follow up in 1 week(s).   Specialty: Internal Medicine Contact information: [place] [address] [phone]                 Signed: Sharen Hones 06/01/2021, 10:42 AM

## 2021-06-02 DIAGNOSIS — R7309 Other abnormal glucose: Secondary | ICD-10-CM | POA: Diagnosis not present

## 2021-06-02 DIAGNOSIS — D649 Anemia, unspecified: Secondary | ICD-10-CM | POA: Diagnosis not present

## 2021-06-02 DIAGNOSIS — I10 Essential (primary) hypertension: Secondary | ICD-10-CM | POA: Diagnosis not present

## 2021-06-02 DIAGNOSIS — I824Y1 Acute embolism and thrombosis of unspecified deep veins of right proximal lower extremity: Secondary | ICD-10-CM | POA: Diagnosis not present

## 2021-06-02 DIAGNOSIS — M25551 Pain in right hip: Secondary | ICD-10-CM | POA: Diagnosis not present

## 2021-06-02 DIAGNOSIS — Z09 Encounter for follow-up examination after completed treatment for conditions other than malignant neoplasm: Secondary | ICD-10-CM | POA: Diagnosis not present

## 2021-06-02 DIAGNOSIS — Z9889 Other specified postprocedural states: Secondary | ICD-10-CM | POA: Diagnosis not present

## 2021-06-02 DIAGNOSIS — Z85850 Personal history of malignant neoplasm of thyroid: Secondary | ICD-10-CM | POA: Diagnosis not present

## 2021-06-02 DIAGNOSIS — S32461A Displaced associated transverse-posterior fracture of right acetabulum, initial encounter for closed fracture: Secondary | ICD-10-CM | POA: Diagnosis not present

## 2021-06-02 DIAGNOSIS — R55 Syncope and collapse: Secondary | ICD-10-CM | POA: Diagnosis not present

## 2021-06-02 DIAGNOSIS — Z6830 Body mass index (BMI) 30.0-30.9, adult: Secondary | ICD-10-CM | POA: Diagnosis not present

## 2021-06-02 DIAGNOSIS — I2510 Atherosclerotic heart disease of native coronary artery without angina pectoris: Secondary | ICD-10-CM | POA: Diagnosis not present

## 2021-06-02 DIAGNOSIS — Z87828 Personal history of other (healed) physical injury and trauma: Secondary | ICD-10-CM | POA: Diagnosis not present

## 2021-06-04 DIAGNOSIS — Z9889 Other specified postprocedural states: Secondary | ICD-10-CM | POA: Diagnosis not present

## 2021-06-04 DIAGNOSIS — M25551 Pain in right hip: Secondary | ICD-10-CM | POA: Diagnosis not present

## 2021-06-04 DIAGNOSIS — Z8781 Personal history of (healed) traumatic fracture: Secondary | ICD-10-CM | POA: Diagnosis not present

## 2021-06-09 DIAGNOSIS — Z9889 Other specified postprocedural states: Secondary | ICD-10-CM | POA: Diagnosis not present

## 2021-06-09 DIAGNOSIS — Z8781 Personal history of (healed) traumatic fracture: Secondary | ICD-10-CM | POA: Diagnosis not present

## 2021-06-09 DIAGNOSIS — M25551 Pain in right hip: Secondary | ICD-10-CM | POA: Diagnosis not present

## 2021-06-11 DIAGNOSIS — M25551 Pain in right hip: Secondary | ICD-10-CM | POA: Diagnosis not present

## 2021-06-11 DIAGNOSIS — Z9889 Other specified postprocedural states: Secondary | ICD-10-CM | POA: Diagnosis not present

## 2021-06-11 DIAGNOSIS — Z8781 Personal history of (healed) traumatic fracture: Secondary | ICD-10-CM | POA: Diagnosis not present

## 2021-06-12 DIAGNOSIS — M25561 Pain in right knee: Secondary | ICD-10-CM | POA: Diagnosis not present

## 2021-06-12 DIAGNOSIS — M25562 Pain in left knee: Secondary | ICD-10-CM | POA: Diagnosis not present

## 2021-06-12 DIAGNOSIS — G8929 Other chronic pain: Secondary | ICD-10-CM | POA: Diagnosis not present

## 2021-06-16 DIAGNOSIS — Z9889 Other specified postprocedural states: Secondary | ICD-10-CM | POA: Diagnosis not present

## 2021-06-16 DIAGNOSIS — M25551 Pain in right hip: Secondary | ICD-10-CM | POA: Diagnosis not present

## 2021-06-16 DIAGNOSIS — Z8781 Personal history of (healed) traumatic fracture: Secondary | ICD-10-CM | POA: Diagnosis not present

## 2021-06-18 ENCOUNTER — Other Ambulatory Visit: Payer: Self-pay | Admitting: Internal Medicine

## 2021-06-18 ENCOUNTER — Other Ambulatory Visit (HOSPITAL_COMMUNITY): Payer: Self-pay | Admitting: Internal Medicine

## 2021-06-18 DIAGNOSIS — R42 Dizziness and giddiness: Secondary | ICD-10-CM

## 2021-06-18 DIAGNOSIS — R55 Syncope and collapse: Secondary | ICD-10-CM

## 2021-06-18 DIAGNOSIS — Z9889 Other specified postprocedural states: Secondary | ICD-10-CM | POA: Diagnosis not present

## 2021-06-18 DIAGNOSIS — Z8781 Personal history of (healed) traumatic fracture: Secondary | ICD-10-CM | POA: Diagnosis not present

## 2021-06-18 DIAGNOSIS — M25551 Pain in right hip: Secondary | ICD-10-CM | POA: Diagnosis not present

## 2021-06-22 ENCOUNTER — Ambulatory Visit
Admission: RE | Admit: 2021-06-22 | Discharge: 2021-06-22 | Disposition: A | Discharge status: Home / Self Care | Payer: PPO | Source: Ambulatory Visit | Attending: Internal Medicine | Admitting: Internal Medicine

## 2021-06-22 ENCOUNTER — Other Ambulatory Visit: Payer: Self-pay

## 2021-06-22 DIAGNOSIS — M1711 Unilateral primary osteoarthritis, right knee: Secondary | ICD-10-CM | POA: Diagnosis not present

## 2021-06-22 DIAGNOSIS — R42 Dizziness and giddiness: Secondary | ICD-10-CM | POA: Diagnosis not present

## 2021-06-22 DIAGNOSIS — R55 Syncope and collapse: Secondary | ICD-10-CM

## 2021-06-22 DIAGNOSIS — J342 Deviated nasal septum: Secondary | ICD-10-CM | POA: Diagnosis not present

## 2021-06-22 DIAGNOSIS — M25562 Pain in left knee: Secondary | ICD-10-CM | POA: Diagnosis not present

## 2021-06-22 DIAGNOSIS — M25461 Effusion, right knee: Secondary | ICD-10-CM | POA: Diagnosis not present

## 2021-06-22 DIAGNOSIS — M25561 Pain in right knee: Secondary | ICD-10-CM | POA: Diagnosis not present

## 2021-06-22 DIAGNOSIS — M1712 Unilateral primary osteoarthritis, left knee: Secondary | ICD-10-CM | POA: Diagnosis not present

## 2021-06-22 DIAGNOSIS — M25462 Effusion, left knee: Secondary | ICD-10-CM | POA: Diagnosis not present

## 2021-06-22 DIAGNOSIS — G8929 Other chronic pain: Secondary | ICD-10-CM | POA: Diagnosis not present

## 2021-06-24 DIAGNOSIS — S32431A Displaced fracture of anterior column [iliopubic] of right acetabulum, initial encounter for closed fracture: Secondary | ICD-10-CM | POA: Diagnosis not present

## 2021-06-25 DIAGNOSIS — I2510 Atherosclerotic heart disease of native coronary artery without angina pectoris: Secondary | ICD-10-CM | POA: Diagnosis not present

## 2021-06-25 DIAGNOSIS — E785 Hyperlipidemia, unspecified: Secondary | ICD-10-CM | POA: Diagnosis not present

## 2021-06-25 DIAGNOSIS — N189 Chronic kidney disease, unspecified: Secondary | ICD-10-CM | POA: Diagnosis not present

## 2021-06-25 DIAGNOSIS — Z79899 Other long term (current) drug therapy: Secondary | ICD-10-CM | POA: Diagnosis not present

## 2021-06-25 DIAGNOSIS — Z7982 Long term (current) use of aspirin: Secondary | ICD-10-CM | POA: Diagnosis not present

## 2021-06-25 DIAGNOSIS — Z87891 Personal history of nicotine dependence: Secondary | ICD-10-CM | POA: Diagnosis not present

## 2021-06-25 DIAGNOSIS — M47816 Spondylosis without myelopathy or radiculopathy, lumbar region: Secondary | ICD-10-CM | POA: Diagnosis not present

## 2021-06-25 DIAGNOSIS — I129 Hypertensive chronic kidney disease with stage 1 through stage 4 chronic kidney disease, or unspecified chronic kidney disease: Secondary | ICD-10-CM | POA: Diagnosis not present

## 2021-06-25 DIAGNOSIS — S32461A Displaced associated transverse-posterior fracture of right acetabulum, initial encounter for closed fracture: Secondary | ICD-10-CM | POA: Diagnosis not present

## 2021-06-25 DIAGNOSIS — E039 Hypothyroidism, unspecified: Secondary | ICD-10-CM | POA: Diagnosis not present

## 2021-06-25 DIAGNOSIS — M160 Bilateral primary osteoarthritis of hip: Secondary | ICD-10-CM | POA: Diagnosis not present

## 2021-06-30 DIAGNOSIS — Z9889 Other specified postprocedural states: Secondary | ICD-10-CM | POA: Diagnosis not present

## 2021-06-30 DIAGNOSIS — M25551 Pain in right hip: Secondary | ICD-10-CM | POA: Diagnosis not present

## 2021-06-30 DIAGNOSIS — J328 Other chronic sinusitis: Secondary | ICD-10-CM | POA: Diagnosis not present

## 2021-06-30 DIAGNOSIS — Z8781 Personal history of (healed) traumatic fracture: Secondary | ICD-10-CM | POA: Diagnosis not present

## 2021-06-30 DIAGNOSIS — J301 Allergic rhinitis due to pollen: Secondary | ICD-10-CM | POA: Diagnosis not present

## 2021-07-07 DIAGNOSIS — Z9889 Other specified postprocedural states: Secondary | ICD-10-CM | POA: Diagnosis not present

## 2021-07-07 DIAGNOSIS — Z8781 Personal history of (healed) traumatic fracture: Secondary | ICD-10-CM | POA: Diagnosis not present

## 2021-07-09 DIAGNOSIS — Z8781 Personal history of (healed) traumatic fracture: Secondary | ICD-10-CM | POA: Diagnosis not present

## 2021-07-09 DIAGNOSIS — Z9889 Other specified postprocedural states: Secondary | ICD-10-CM | POA: Diagnosis not present

## 2021-07-09 DIAGNOSIS — M25551 Pain in right hip: Secondary | ICD-10-CM | POA: Diagnosis not present

## 2021-07-10 DIAGNOSIS — G629 Polyneuropathy, unspecified: Secondary | ICD-10-CM | POA: Diagnosis not present

## 2021-07-10 DIAGNOSIS — I10 Essential (primary) hypertension: Secondary | ICD-10-CM | POA: Diagnosis not present

## 2021-07-10 DIAGNOSIS — R202 Paresthesia of skin: Secondary | ICD-10-CM | POA: Diagnosis not present

## 2021-07-10 DIAGNOSIS — E118 Type 2 diabetes mellitus with unspecified complications: Secondary | ICD-10-CM | POA: Diagnosis not present

## 2021-07-10 DIAGNOSIS — E559 Vitamin D deficiency, unspecified: Secondary | ICD-10-CM | POA: Diagnosis not present

## 2021-07-10 DIAGNOSIS — Z79899 Other long term (current) drug therapy: Secondary | ICD-10-CM | POA: Diagnosis not present

## 2021-07-10 DIAGNOSIS — I2510 Atherosclerotic heart disease of native coronary artery without angina pectoris: Secondary | ICD-10-CM | POA: Diagnosis not present

## 2021-07-10 DIAGNOSIS — G5603 Carpal tunnel syndrome, bilateral upper limbs: Secondary | ICD-10-CM | POA: Diagnosis not present

## 2021-07-14 ENCOUNTER — Other Ambulatory Visit: Payer: Self-pay | Admitting: Neurology

## 2021-07-14 DIAGNOSIS — R202 Paresthesia of skin: Secondary | ICD-10-CM

## 2021-07-14 DIAGNOSIS — Z8781 Personal history of (healed) traumatic fracture: Secondary | ICD-10-CM | POA: Diagnosis not present

## 2021-07-14 DIAGNOSIS — Z9889 Other specified postprocedural states: Secondary | ICD-10-CM | POA: Diagnosis not present

## 2021-07-14 DIAGNOSIS — M25551 Pain in right hip: Secondary | ICD-10-CM | POA: Diagnosis not present

## 2021-07-15 ENCOUNTER — Other Ambulatory Visit: Payer: Self-pay | Admitting: Neurology

## 2021-07-15 DIAGNOSIS — R202 Paresthesia of skin: Secondary | ICD-10-CM

## 2021-07-15 DIAGNOSIS — R972 Elevated prostate specific antigen [PSA]: Secondary | ICD-10-CM | POA: Diagnosis not present

## 2021-07-15 DIAGNOSIS — N401 Enlarged prostate with lower urinary tract symptoms: Secondary | ICD-10-CM | POA: Diagnosis not present

## 2021-07-16 DIAGNOSIS — Z9889 Other specified postprocedural states: Secondary | ICD-10-CM | POA: Diagnosis not present

## 2021-07-16 DIAGNOSIS — M25551 Pain in right hip: Secondary | ICD-10-CM | POA: Diagnosis not present

## 2021-07-16 DIAGNOSIS — Z8781 Personal history of (healed) traumatic fracture: Secondary | ICD-10-CM | POA: Diagnosis not present

## 2021-07-21 DIAGNOSIS — Z8781 Personal history of (healed) traumatic fracture: Secondary | ICD-10-CM | POA: Diagnosis not present

## 2021-07-21 DIAGNOSIS — M25551 Pain in right hip: Secondary | ICD-10-CM | POA: Diagnosis not present

## 2021-07-21 DIAGNOSIS — Z9889 Other specified postprocedural states: Secondary | ICD-10-CM | POA: Diagnosis not present

## 2021-07-23 DIAGNOSIS — Z8781 Personal history of (healed) traumatic fracture: Secondary | ICD-10-CM | POA: Diagnosis not present

## 2021-07-23 DIAGNOSIS — Z9889 Other specified postprocedural states: Secondary | ICD-10-CM | POA: Diagnosis not present

## 2021-07-23 DIAGNOSIS — M25551 Pain in right hip: Secondary | ICD-10-CM | POA: Diagnosis not present

## 2021-07-30 DIAGNOSIS — Z8781 Personal history of (healed) traumatic fracture: Secondary | ICD-10-CM | POA: Diagnosis not present

## 2021-07-30 DIAGNOSIS — Z9889 Other specified postprocedural states: Secondary | ICD-10-CM | POA: Diagnosis not present

## 2021-08-02 ENCOUNTER — Ambulatory Visit
Admission: RE | Admit: 2021-08-02 | Discharge: 2021-08-02 | Disposition: A | Discharge status: Home / Self Care | Payer: PPO | Source: Ambulatory Visit | Attending: Neurology | Admitting: Neurology

## 2021-08-02 ENCOUNTER — Other Ambulatory Visit: Payer: Self-pay

## 2021-08-02 DIAGNOSIS — M4802 Spinal stenosis, cervical region: Secondary | ICD-10-CM | POA: Diagnosis not present

## 2021-08-02 DIAGNOSIS — R202 Paresthesia of skin: Secondary | ICD-10-CM

## 2021-08-03 DIAGNOSIS — Z8781 Personal history of (healed) traumatic fracture: Secondary | ICD-10-CM | POA: Diagnosis not present

## 2021-08-03 DIAGNOSIS — M25551 Pain in right hip: Secondary | ICD-10-CM | POA: Diagnosis not present

## 2021-08-03 DIAGNOSIS — Z9889 Other specified postprocedural states: Secondary | ICD-10-CM | POA: Diagnosis not present

## 2021-08-05 ENCOUNTER — Other Ambulatory Visit: Payer: PPO

## 2021-08-06 ENCOUNTER — Other Ambulatory Visit: Payer: Self-pay

## 2021-08-06 ENCOUNTER — Emergency Department: Payer: PPO

## 2021-08-06 ENCOUNTER — Inpatient Hospital Stay
Admission: EM | Admit: 2021-08-06 | Discharge: 2021-08-08 | DRG: 314 | Disposition: A | Discharge status: Home / Self Care | Payer: PPO | Attending: Internal Medicine | Admitting: Internal Medicine

## 2021-08-06 ENCOUNTER — Encounter: Payer: Self-pay | Admitting: Emergency Medicine

## 2021-08-06 DIAGNOSIS — I959 Hypotension, unspecified: Principal | ICD-10-CM | POA: Diagnosis present

## 2021-08-06 DIAGNOSIS — E039 Hypothyroidism, unspecified: Secondary | ICD-10-CM

## 2021-08-06 DIAGNOSIS — Z79899 Other long term (current) drug therapy: Secondary | ICD-10-CM

## 2021-08-06 DIAGNOSIS — I824Z1 Acute embolism and thrombosis of unspecified deep veins of right distal lower extremity: Secondary | ICD-10-CM | POA: Diagnosis not present

## 2021-08-06 DIAGNOSIS — Z28310 Unvaccinated for covid-19: Secondary | ICD-10-CM

## 2021-08-06 DIAGNOSIS — M1990 Unspecified osteoarthritis, unspecified site: Secondary | ICD-10-CM | POA: Diagnosis not present

## 2021-08-06 DIAGNOSIS — M1611 Unilateral primary osteoarthritis, right hip: Secondary | ICD-10-CM | POA: Diagnosis not present

## 2021-08-06 DIAGNOSIS — I10 Essential (primary) hypertension: Secondary | ICD-10-CM | POA: Diagnosis present

## 2021-08-06 DIAGNOSIS — Z955 Presence of coronary angioplasty implant and graft: Secondary | ICD-10-CM

## 2021-08-06 DIAGNOSIS — M19011 Primary osteoarthritis, right shoulder: Secondary | ICD-10-CM | POA: Diagnosis present

## 2021-08-06 DIAGNOSIS — I440 Atrioventricular block, first degree: Secondary | ICD-10-CM | POA: Diagnosis present

## 2021-08-06 DIAGNOSIS — K219 Gastro-esophageal reflux disease without esophagitis: Secondary | ICD-10-CM | POA: Diagnosis not present

## 2021-08-06 DIAGNOSIS — N400 Enlarged prostate without lower urinary tract symptoms: Secondary | ICD-10-CM | POA: Diagnosis present

## 2021-08-06 DIAGNOSIS — R001 Bradycardia, unspecified: Secondary | ICD-10-CM | POA: Diagnosis not present

## 2021-08-06 DIAGNOSIS — R531 Weakness: Secondary | ICD-10-CM | POA: Diagnosis not present

## 2021-08-06 DIAGNOSIS — I2510 Atherosclerotic heart disease of native coronary artery without angina pectoris: Secondary | ICD-10-CM | POA: Diagnosis present

## 2021-08-06 DIAGNOSIS — Z7982 Long term (current) use of aspirin: Secondary | ICD-10-CM | POA: Diagnosis not present

## 2021-08-06 DIAGNOSIS — R55 Syncope and collapse: Secondary | ICD-10-CM | POA: Diagnosis not present

## 2021-08-06 DIAGNOSIS — I82441 Acute embolism and thrombosis of right tibial vein: Secondary | ICD-10-CM | POA: Diagnosis present

## 2021-08-06 DIAGNOSIS — I82401 Acute embolism and thrombosis of unspecified deep veins of right lower extremity: Secondary | ICD-10-CM | POA: Diagnosis present

## 2021-08-06 DIAGNOSIS — E782 Mixed hyperlipidemia: Secondary | ICD-10-CM | POA: Diagnosis not present

## 2021-08-06 DIAGNOSIS — I252 Old myocardial infarction: Secondary | ICD-10-CM | POA: Diagnosis not present

## 2021-08-06 DIAGNOSIS — M7989 Other specified soft tissue disorders: Secondary | ICD-10-CM | POA: Diagnosis not present

## 2021-08-06 DIAGNOSIS — Z888 Allergy status to other drugs, medicaments and biological substances status: Secondary | ICD-10-CM

## 2021-08-06 DIAGNOSIS — M25511 Pain in right shoulder: Secondary | ICD-10-CM

## 2021-08-06 DIAGNOSIS — Z87891 Personal history of nicotine dependence: Secondary | ICD-10-CM | POA: Diagnosis not present

## 2021-08-06 DIAGNOSIS — R4182 Altered mental status, unspecified: Secondary | ICD-10-CM | POA: Diagnosis not present

## 2021-08-06 DIAGNOSIS — Z79890 Hormone replacement therapy (postmenopausal): Secondary | ICD-10-CM | POA: Diagnosis not present

## 2021-08-06 DIAGNOSIS — M4802 Spinal stenosis, cervical region: Secondary | ICD-10-CM | POA: Diagnosis present

## 2021-08-06 DIAGNOSIS — Z7901 Long term (current) use of anticoagulants: Secondary | ICD-10-CM | POA: Diagnosis not present

## 2021-08-06 DIAGNOSIS — E890 Postprocedural hypothyroidism: Secondary | ICD-10-CM | POA: Diagnosis present

## 2021-08-06 DIAGNOSIS — G629 Polyneuropathy, unspecified: Secondary | ICD-10-CM | POA: Diagnosis present

## 2021-08-06 DIAGNOSIS — U071 COVID-19: Secondary | ICD-10-CM | POA: Diagnosis present

## 2021-08-06 DIAGNOSIS — Z85850 Personal history of malignant neoplasm of thyroid: Secondary | ICD-10-CM

## 2021-08-06 DIAGNOSIS — E785 Hyperlipidemia, unspecified: Secondary | ICD-10-CM | POA: Diagnosis present

## 2021-08-06 DIAGNOSIS — Z86718 Personal history of other venous thrombosis and embolism: Secondary | ICD-10-CM

## 2021-08-06 DIAGNOSIS — I129 Hypertensive chronic kidney disease with stage 1 through stage 4 chronic kidney disease, or unspecified chronic kidney disease: Secondary | ICD-10-CM | POA: Diagnosis not present

## 2021-08-06 DIAGNOSIS — S32461A Displaced associated transverse-posterior fracture of right acetabulum, initial encounter for closed fracture: Secondary | ICD-10-CM | POA: Diagnosis not present

## 2021-08-06 DIAGNOSIS — R0902 Hypoxemia: Secondary | ICD-10-CM | POA: Diagnosis not present

## 2021-08-06 DIAGNOSIS — S32461D Displaced associated transverse-posterior fracture of right acetabulum, subsequent encounter for fracture with routine healing: Secondary | ICD-10-CM | POA: Diagnosis not present

## 2021-08-06 DIAGNOSIS — M75101 Unspecified rotator cuff tear or rupture of right shoulder, not specified as traumatic: Secondary | ICD-10-CM | POA: Diagnosis present

## 2021-08-06 DIAGNOSIS — N189 Chronic kidney disease, unspecified: Secondary | ICD-10-CM | POA: Diagnosis not present

## 2021-08-06 DIAGNOSIS — M79605 Pain in left leg: Secondary | ICD-10-CM | POA: Diagnosis not present

## 2021-08-06 DIAGNOSIS — J9811 Atelectasis: Secondary | ICD-10-CM | POA: Diagnosis not present

## 2021-08-06 DIAGNOSIS — M79604 Pain in right leg: Secondary | ICD-10-CM | POA: Diagnosis not present

## 2021-08-06 DIAGNOSIS — S32401D Unspecified fracture of right acetabulum, subsequent encounter for fracture with routine healing: Secondary | ICD-10-CM

## 2021-08-06 DIAGNOSIS — M5030 Other cervical disc degeneration, unspecified cervical region: Secondary | ICD-10-CM | POA: Diagnosis present

## 2021-08-06 DIAGNOSIS — I517 Cardiomegaly: Secondary | ICD-10-CM | POA: Diagnosis not present

## 2021-08-06 DIAGNOSIS — G319 Degenerative disease of nervous system, unspecified: Secondary | ICD-10-CM | POA: Diagnosis not present

## 2021-08-06 DIAGNOSIS — M12811 Other specific arthropathies, not elsewhere classified, right shoulder: Secondary | ICD-10-CM | POA: Diagnosis not present

## 2021-08-06 LAB — URINALYSIS, COMPLETE (UACMP) WITH MICROSCOPIC
Bacteria, UA: NONE SEEN
Bilirubin Urine: NEGATIVE
Glucose, UA: NEGATIVE mg/dL
Hgb urine dipstick: NEGATIVE
Ketones, ur: NEGATIVE mg/dL
Leukocytes,Ua: NEGATIVE
Nitrite: NEGATIVE
Protein, ur: NEGATIVE mg/dL
Specific Gravity, Urine: 1.017 (ref 1.005–1.030)
pH: 5 (ref 5.0–8.0)

## 2021-08-06 LAB — RESP PANEL BY RT-PCR (FLU A&B, COVID) ARPGX2
Influenza A by PCR: NEGATIVE
Influenza B by PCR: NEGATIVE
SARS Coronavirus 2 by RT PCR: POSITIVE — AB

## 2021-08-06 LAB — D-DIMER, QUANTITATIVE: D-Dimer, Quant: 3.17 ug/mL-FEU — ABNORMAL HIGH (ref 0.00–0.50)

## 2021-08-06 LAB — CBC
HCT: 33.8 % — ABNORMAL LOW (ref 39.0–52.0)
Hemoglobin: 11 g/dL — ABNORMAL LOW (ref 13.0–17.0)
MCH: 26.8 pg (ref 26.0–34.0)
MCHC: 32.5 g/dL (ref 30.0–36.0)
MCV: 82.2 fL (ref 80.0–100.0)
Platelets: 188 10*3/uL (ref 150–400)
RBC: 4.11 MIL/uL — ABNORMAL LOW (ref 4.22–5.81)
RDW: 17.5 % — ABNORMAL HIGH (ref 11.5–15.5)
WBC: 4.3 10*3/uL (ref 4.0–10.5)
nRBC: 0 % (ref 0.0–0.2)

## 2021-08-06 LAB — BASIC METABOLIC PANEL
Anion gap: 7 (ref 5–15)
BUN: 29 mg/dL — ABNORMAL HIGH (ref 8–23)
CO2: 26 mmol/L (ref 22–32)
Calcium: 8.6 mg/dL — ABNORMAL LOW (ref 8.9–10.3)
Chloride: 105 mmol/L (ref 98–111)
Creatinine, Ser: 1.54 mg/dL — ABNORMAL HIGH (ref 0.61–1.24)
GFR, Estimated: 46 mL/min — ABNORMAL LOW (ref >60)
Glucose, Bld: 101 mg/dL — ABNORMAL HIGH (ref 70–99)
Potassium: 3.6 mmol/L (ref 3.5–5.1)
Sodium: 138 mmol/L (ref 135–145)

## 2021-08-06 LAB — TROPONIN I (HIGH SENSITIVITY): Troponin I (High Sensitivity): 4 ng/L (ref <18)

## 2021-08-06 MED ORDER — ONDANSETRON HCL 4 MG PO TABS: 4.0000 mg | ORAL_TABLET | Freq: Four times a day (QID) | ORAL | Status: DC | PRN

## 2021-08-06 MED ORDER — SODIUM CHLORIDE 0.9 % IV SOLN
200.0000 mg | Freq: Once | INTRAVENOUS | Status: AC
  Administered 2021-08-07: 200 mg via INTRAVENOUS
  Filled 2021-08-06: qty 200

## 2021-08-06 MED ORDER — LEVOTHYROXINE SODIUM 137 MCG PO TABS
137.0000 ug | ORAL_TABLET | Freq: Every day | ORAL | Status: DC
  Administered 2021-08-07 – 2021-08-08 (×2): 137 ug via ORAL
  Filled 2021-08-06 (×2): qty 1

## 2021-08-06 MED ORDER — SODIUM CHLORIDE 0.9 % IV SOLN
100.0000 mg | Freq: Every day | INTRAVENOUS | Status: DC
  Administered 2021-08-07 – 2021-08-08 (×2): 100 mg via INTRAVENOUS
  Filled 2021-08-06: qty 100
  Filled 2021-08-06: qty 20

## 2021-08-06 MED ORDER — APIXABAN 5 MG PO TABS: 10.0000 mg | ORAL_TABLET | Freq: Two times a day (BID) | ORAL | Status: DC

## 2021-08-06 MED ORDER — ONDANSETRON HCL 4 MG/2ML IJ SOLN
4.0000 mg | Freq: Four times a day (QID) | INTRAMUSCULAR | Status: DC | PRN
Start: 2021-08-06 — End: 2021-08-08

## 2021-08-06 MED ORDER — GUAIFENESIN-DM 100-10 MG/5ML PO SYRP
10.0000 mL | ORAL_SOLUTION | ORAL | Status: DC | PRN
  Administered 2021-08-07 – 2021-08-08 (×3): 10 mL via ORAL
  Filled 2021-08-06 (×4): qty 10

## 2021-08-06 MED ORDER — DOCUSATE SODIUM 100 MG PO CAPS
200.0000 mg | ORAL_CAPSULE | Freq: Two times a day (BID) | ORAL | Status: DC
  Administered 2021-08-07 (×2): 200 mg via ORAL
  Filled 2021-08-06 (×3): qty 2

## 2021-08-06 MED ORDER — APIXABAN 5 MG PO TABS: 5.0000 mg | ORAL_TABLET | Freq: Two times a day (BID) | ORAL | Status: DC

## 2021-08-06 MED ORDER — ALLOPURINOL 100 MG PO TABS
100.0000 mg | ORAL_TABLET | Freq: Every day | ORAL | Status: DC
  Administered 2021-08-07 – 2021-08-08 (×2): 100 mg via ORAL
  Filled 2021-08-06 (×2): qty 1

## 2021-08-06 MED ORDER — ROSUVASTATIN CALCIUM 20 MG PO TABS
40.0000 mg | ORAL_TABLET | Freq: Every day | ORAL | Status: DC
  Administered 2021-08-07: 21:00:00 40 mg via ORAL
  Filled 2021-08-06 (×2): qty 2

## 2021-08-06 MED ORDER — ACETAMINOPHEN 325 MG PO TABS
325.0000 mg | ORAL_TABLET | Freq: Four times a day (QID) | ORAL | Status: DC | PRN
  Administered 2021-08-07 – 2021-08-08 (×3): 325 mg via ORAL
  Filled 2021-08-06 (×3): qty 1

## 2021-08-06 MED ORDER — IOHEXOL 350 MG/ML SOLN
75.0000 mL | Freq: Once | INTRAVENOUS | Status: AC | PRN
  Administered 2021-08-06: 75 mL via INTRAVENOUS

## 2021-08-06 MED ORDER — ASPIRIN EC 81 MG PO TBEC
81.0000 mg | DELAYED_RELEASE_TABLET | Freq: Every day | ORAL | Status: DC
  Administered 2021-08-08: 10:00:00 81 mg via ORAL
  Filled 2021-08-06 (×2): qty 1

## 2021-08-06 MED ORDER — LORAZEPAM 2 MG/ML IJ SOLN
1.0000 mg | Freq: Once | INTRAMUSCULAR | Status: AC | PRN
  Administered 2021-08-06: 1 mg via INTRAVENOUS
  Filled 2021-08-06: qty 1

## 2021-08-06 MED ORDER — LACTATED RINGERS IV BOLUS
1000.0000 mL | Freq: Once | INTRAVENOUS | Status: AC
  Administered 2021-08-06: 1000 mL via INTRAVENOUS
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
## 2021-08-06 NOTE — Consult Note (Signed)
Remdesivir - Pharmacy Brief Note   O:  ALT: N/A CXR: Bibasilar atelectasis.  Mild cardiomegaly. SpO2: 100% on RA   A/P:  Remdesivir 200 mg IVPB once followed by 100 mg IVPB daily x 4 days.   Pearla Dubonnet, PharmD Clinical Pharmacist 08/06/2021 10:41 PM

## 2021-08-06 NOTE — ED Triage Notes (Signed)
Pt via EMS from home. Per EMS, pt had a syncopal episode today. Per wife pt was out of approx 20 seconds. Pt initial BP was 90/40. Pt was diaphoretic and pale. EMS gave 547m of fluid. Pt denies hx of seizure. On arrival, pt is responsive but lethargic. CBG 140. Pt is A&Ox4.

## 2021-08-06 NOTE — ED Provider Notes (Signed)
[facility] Emergency Department Provider Note   ____________________________________________   Event Date/Time   First MD Initiated Contact with Patient 08/06/21 1826     (approximate)  I have reviewed the triage vital signs and the nursing notes.   HISTORY  Chief Complaint Loss of Consciousness    HPI Keith Williamson is a 75 y.o. male with past medical history of hypertension, hyperlipidemia, CAD, CKD, GERD, and DVT who presents to the ED for syncope.  Wife states that patient was sitting on the couch watching TV this evening when he suddenly lost consciousness.  During this episode, wife had a hard time arousing the patient and he did not seem to regain consciousness until EMS arrived about 15 minutes later.  Wife states that patient had been feeling ill for the past couple of days with a cough and weakness, noted to have a temperature of 101 at his doctor's office yesterday.  He denies any chest pain, shortness of breath, vomiting, diarrhea, dysuria, or abdominal pain.  He has not had any sick contacts, has not been vaccinated against COVID-19.  Wife reports similar syncopal episode 2 months ago requiring admission.        Past Medical History:  Diagnosis Date   Arthritis    BPH (benign prostatic hypertrophy)    CAD (coronary artery disease)    Diverticulosis    Enlarged prostate    GERD (gastroesophageal reflux disease)    Headache(784.0)    frequent sinus headache   Hurthle cell tumor    Hyperlipidemia    Hypertension    Hypothyroidism    MI (myocardial infarction) ([place]) 2014   inferior; s/p PCI with stent placement to RCA   OA (osteoarthritis)    Retina hole    Senile nuclear sclerosis    Thyroid cancer ([place])    iodine tx   Valvular heart disease     Patient Active Problem List   Diagnosis Date Noted   Syncope 05/31/2021   Right leg DVT ([place]) 05/31/2021   Spinal stenosis, lumbar 08/07/2012    Past Surgical History:  Procedure  Laterality Date   ANGIOPLASTY / STENTING FEMORAL     BACK SURGERY     COLONOSCOPY WITH PROPOFOL N/A 05/07/2016   Procedure: COLONOSCOPY WITH PROPOFOL;  Surgeon: Lollie Sails, MD;  Location: [facility] ENDOSCOPY;  Service: Endoscopy;  Laterality: N/A;   ESOPHAGOGASTRODUODENOSCOPY (EGD) WITH PROPOFOL N/A 05/07/2016   Procedure: ESOPHAGOGASTRODUODENOSCOPY (EGD) WITH PROPOFOL;  Surgeon: Lollie Sails, MD;  Location: [facility] ENDOSCOPY;  Service: Endoscopy;  Laterality: N/A;   GREEN LIGHT LASER TURP (TRANSURETHRAL RESECTION OF PROSTATE N/A 01/01/2021   Procedure: GREEN LIGHT LASER TURP (TRANSURETHRAL RESECTION OF PROSTATE;  Surgeon: Royston Cowper, MD;  Location: [facility] ORS;  Service: Urology;  Laterality: N/A;   KNEE ARTHROSCOPY Left    Medial and lateral meniscectomy, partial   LUMBAR LAMINECTOMY  08/16/2012   L 3 4 & 5   LUMBAR MICRODISCECTOMY     L3-L4; with partial hemilaminectomy, partial facetectomy and foraminotomy   TOTAL THYROIDECTOMY     TURP VAPORIZATION      Prior to Admission medications   Medication Sig Start Date End Date Taking? Authorizing Provider  allopurinol (ZYLOPRIM) 100 MG tablet Take 100 mg by mouth daily.    [provider]  apixaban (ELIQUIS) 5 MG TABS tablet Take 2 tablets (10 mg total) by mouth 2 (two) times daily for 6 days. 06/01/21 06/07/21  Sharen Hones, MD  apixaban (ELIQUIS) 5 MG TABS  tablet Take 1 tablet (5 mg total) by mouth 2 (two) times daily. 06/07/21   Sharen Hones, MD  aspirin 81 MG tablet Take 81 mg by mouth daily. Patient not taking: No sig reported    [provider]  candesartan (ATACAND) 32 MG tablet Take 32 mg by mouth daily.    [provider]  carvedilol (COREG) 3.125 MG tablet Take 3.125 mg by mouth 2 (two) times daily with a meal.    [provider]  docusate sodium (COLACE) 100 MG capsule Take 2 capsules (200 mg total) by mouth 2 (two) times daily. 01/01/21   Royston Cowper, MD  dutasteride (AVODART) 0.5 MG  capsule Take 0.5 mg by mouth daily.    [provider]  fluticasone (FLONASE) 50 MCG/ACT nasal spray Place 2 sprays into both nostrils daily.    [provider]  gabapentin (NEURONTIN) 300 MG capsule Take 300 mg by mouth 2 (two) times daily.    [provider]  levothyroxine (SYNTHROID, LEVOTHROID) 137 MCG tablet Take 137 mcg by mouth daily before breakfast.    [provider]  Multiple Vitamins-Minerals (MULTIVITAMIN WITH MINERALS) tablet Take 1 tablet by mouth daily.    [provider]  nitroGLYCERIN (NITROSTAT) 0.4 MG SL tablet Place 0.4 mg under the tongue every 5 (five) minutes as needed for chest pain.    [provider]  rosuvastatin (CRESTOR) 40 MG tablet Take 40 mg by mouth daily.    [provider]    Allergies Amlodipine, Levsin [hyoscyamine], Beta adrenergic blockers, Hydralazine, Hydrochlorothiazide, Other, and Zetia [ezetimibe]  History reviewed. No pertinent family history.  Social History Social History   Tobacco Use   Smoking status: Former    Types: Cigarettes    Quit date: 12/17/1981    Years since quitting: 39.6   Smokeless tobacco: Never  Substance Use Topics   Alcohol use: No   Drug use: No    Review of Systems  Constitutional: Positive for fever/chills.  Positive for generalized weakness and fatigue. Eyes: No visual changes. ENT: No sore throat. Cardiovascular: Denies chest pain.  Positive for syncope. Respiratory: Denies shortness of breath.  Positive for cough. Gastrointestinal: No abdominal pain.  No nausea, no vomiting.  No diarrhea.  No constipation. Genitourinary: Negative for dysuria. Musculoskeletal: Negative for back pain. Skin: Negative for rash. Neurological: Negative for headaches, focal weakness or numbness.  ____________________________________________   PHYSICAL EXAM:  VITAL SIGNS: ED Triage Vitals [08/06/21 1557]  Enc Vitals Group     BP 95/62     Pulse Rate 62      Resp 18     Temp 98 F (36.7 C)     Temp Source Oral     SpO2 96 %     Weight 221 lb (100.2 kg)     Height '6\' 2"'$  (1.88 m)     Head Circumference      Peak Flow      Pain Score 0     Pain Loc      Pain Edu?      Excl. in [place]?     Constitutional: Somnolent but arousable to voice, oriented. Eyes: Conjunctivae are normal. Head: Atraumatic. Nose: No congestion/rhinnorhea. Mouth/Throat: Mucous membranes are moist. Neck: Normal ROM Cardiovascular: Bradycardic, regular rhythm. Grossly normal heart sounds.  2+ radial pulses bilaterally. Respiratory: Normal respiratory effort.  No retractions. Lungs CTAB. Gastrointestinal: Soft and nontender. No distention. Genitourinary: deferred Musculoskeletal: No lower extremity tenderness nor edema. Neurologic:  Normal speech and  language. No gross focal neurologic deficits are appreciated. Skin:  Skin is warm, dry and intact. No rash noted. Psychiatric: Mood and affect are normal. Speech and behavior are normal.  ____________________________________________   LABS (all labs ordered are listed, but only abnormal results are displayed)  Labs Reviewed  RESP PANEL BY RT-PCR (FLU A&B, COVID) ARPGX2 - Abnormal; Notable for the following components:      Result Value   SARS Coronavirus 2 by RT PCR POSITIVE (*)    All other components within normal limits  BASIC METABOLIC PANEL - Abnormal; Notable for the following components:   Glucose, Bld 101 (*)    BUN 29 (*)    Creatinine, Ser 1.54 (*)    Calcium 8.6 (*)    GFR, Estimated 46 (*)    All other components within normal limits  CBC - Abnormal; Notable for the following components:   RBC 4.11 (*)    Hemoglobin 11.0 (*)    HCT 33.8 (*)    RDW 17.5 (*)    All other components within normal limits  D-DIMER, QUANTITATIVE - Abnormal; Notable for the following components:   D-Dimer, Quant 3.17 (*)    All other components within normal limits  URINALYSIS, COMPLETE (UACMP) WITH MICROSCOPIC  CBG  MONITORING, ED  TROPONIN I (HIGH SENSITIVITY)   ____________________________________________  EKG  ED ECG REPORT I, Blake Divine, the attending physician, personally viewed and interpreted this ECG.   Date: 08/06/2021  EKG Time: 16:02  Rate: 59  Rhythm: sinus bradycardia  Axis: LAD  Intervals:first-degree A-V block   ST&T Change: None   PROCEDURES  Procedure(s) performed (including Critical Care):  Procedures   ____________________________________________   INITIAL IMPRESSION / ASSESSMENT AND PLAN / ED COURSE      75 year old male with past medical history of hypertension, hyperlipidemia, CAD, CKD, and DVT who presents to the ED following syncopal episode where he was unconscious for about 15 minutes.  He denies any associated chest pain or shortness of breath, EKG shows sinus bradycardia with no ischemic changes.  Patient does have history of recent DVT, was only briefly on anticoagulants.  Suspicion for PE is relatively low, however given his history we will send D-dimer.  Initial labs are unremarkable, we will observe on cardiac monitor and add on troponin.  He is afebrile here but reports recent fever and we will screen chest x-ray as well as UA, perform testing for COVID-19.  D-dimer is elevated, however CTA shows no evidence of PE or other acute process.  Testing for COVID-19 did come back positive, which could explain patient's weakness and malaise recently.  No events noted on cardiac monitor and troponin is within normal limits.  Given patient's syncope with significant ongoing weakness, case discussed with hospitalist for admission.      ____________________________________________   FINAL CLINICAL IMPRESSION(S) / ED DIAGNOSES  Final diagnoses:  Syncope and collapse  COVID-19     ED Discharge Orders     None        Note:  This document was prepared using Dragon voice recognition software and may include unintentional dictation errors.     Blake Divine, MD 08/06/21 2151

## 2021-08-06 NOTE — ED Notes (Signed)
Lab reports positive covid test Dr. Charna Archer notified.

## 2021-08-07 ENCOUNTER — Observation Stay: Payer: PPO

## 2021-08-07 ENCOUNTER — Observation Stay (HOSPITAL_COMMUNITY): Payer: PPO

## 2021-08-07 DIAGNOSIS — M79605 Pain in left leg: Secondary | ICD-10-CM | POA: Diagnosis not present

## 2021-08-07 DIAGNOSIS — G319 Degenerative disease of nervous system, unspecified: Secondary | ICD-10-CM | POA: Diagnosis not present

## 2021-08-07 DIAGNOSIS — Z86718 Personal history of other venous thrombosis and embolism: Secondary | ICD-10-CM | POA: Diagnosis not present

## 2021-08-07 DIAGNOSIS — Z28310 Unvaccinated for covid-19: Secondary | ICD-10-CM | POA: Diagnosis not present

## 2021-08-07 DIAGNOSIS — U071 COVID-19: Secondary | ICD-10-CM | POA: Diagnosis not present

## 2021-08-07 DIAGNOSIS — I2510 Atherosclerotic heart disease of native coronary artery without angina pectoris: Secondary | ICD-10-CM | POA: Diagnosis present

## 2021-08-07 DIAGNOSIS — I824Z1 Acute embolism and thrombosis of unspecified deep veins of right distal lower extremity: Secondary | ICD-10-CM

## 2021-08-07 DIAGNOSIS — I252 Old myocardial infarction: Secondary | ICD-10-CM | POA: Diagnosis not present

## 2021-08-07 DIAGNOSIS — R55 Syncope and collapse: Secondary | ICD-10-CM | POA: Diagnosis not present

## 2021-08-07 DIAGNOSIS — K219 Gastro-esophageal reflux disease without esophagitis: Secondary | ICD-10-CM | POA: Diagnosis present

## 2021-08-07 DIAGNOSIS — I10 Essential (primary) hypertension: Secondary | ICD-10-CM | POA: Diagnosis present

## 2021-08-07 DIAGNOSIS — Z79890 Hormone replacement therapy (postmenopausal): Secondary | ICD-10-CM | POA: Diagnosis not present

## 2021-08-07 DIAGNOSIS — M1990 Unspecified osteoarthritis, unspecified site: Secondary | ICD-10-CM | POA: Diagnosis present

## 2021-08-07 DIAGNOSIS — Z85850 Personal history of malignant neoplasm of thyroid: Secondary | ICD-10-CM | POA: Diagnosis not present

## 2021-08-07 DIAGNOSIS — Z888 Allergy status to other drugs, medicaments and biological substances status: Secondary | ICD-10-CM | POA: Diagnosis not present

## 2021-08-07 DIAGNOSIS — Z7901 Long term (current) use of anticoagulants: Secondary | ICD-10-CM | POA: Diagnosis not present

## 2021-08-07 DIAGNOSIS — M19011 Primary osteoarthritis, right shoulder: Secondary | ICD-10-CM | POA: Diagnosis not present

## 2021-08-07 DIAGNOSIS — I959 Hypotension, unspecified: Secondary | ICD-10-CM | POA: Diagnosis present

## 2021-08-07 DIAGNOSIS — E890 Postprocedural hypothyroidism: Secondary | ICD-10-CM | POA: Diagnosis present

## 2021-08-07 DIAGNOSIS — M7989 Other specified soft tissue disorders: Secondary | ICD-10-CM | POA: Diagnosis not present

## 2021-08-07 DIAGNOSIS — Z79899 Other long term (current) drug therapy: Secondary | ICD-10-CM | POA: Diagnosis not present

## 2021-08-07 DIAGNOSIS — Z87891 Personal history of nicotine dependence: Secondary | ICD-10-CM | POA: Diagnosis not present

## 2021-08-07 DIAGNOSIS — I440 Atrioventricular block, first degree: Secondary | ICD-10-CM | POA: Diagnosis present

## 2021-08-07 DIAGNOSIS — M79604 Pain in right leg: Secondary | ICD-10-CM | POA: Diagnosis not present

## 2021-08-07 DIAGNOSIS — E785 Hyperlipidemia, unspecified: Secondary | ICD-10-CM | POA: Diagnosis present

## 2021-08-07 DIAGNOSIS — Z7982 Long term (current) use of aspirin: Secondary | ICD-10-CM | POA: Diagnosis not present

## 2021-08-07 DIAGNOSIS — N400 Enlarged prostate without lower urinary tract symptoms: Secondary | ICD-10-CM | POA: Diagnosis present

## 2021-08-07 DIAGNOSIS — Z955 Presence of coronary angioplasty implant and graft: Secondary | ICD-10-CM | POA: Diagnosis not present

## 2021-08-07 DIAGNOSIS — I82441 Acute embolism and thrombosis of right tibial vein: Secondary | ICD-10-CM | POA: Diagnosis present

## 2021-08-07 DIAGNOSIS — G629 Polyneuropathy, unspecified: Secondary | ICD-10-CM | POA: Diagnosis present

## 2021-08-07 DIAGNOSIS — M4802 Spinal stenosis, cervical region: Secondary | ICD-10-CM | POA: Diagnosis present

## 2021-08-07 LAB — CBC WITH DIFFERENTIAL/PLATELET
Abs Immature Granulocytes: 0 10*3/uL (ref 0.00–0.07)
Basophils Absolute: 0 10*3/uL (ref 0.0–0.1)
Basophils Relative: 1 %
Eosinophils Absolute: 0.1 10*3/uL (ref 0.0–0.5)
Eosinophils Relative: 2 %
HCT: 34.8 % — ABNORMAL LOW (ref 39.0–52.0)
Hemoglobin: 11.2 g/dL — ABNORMAL LOW (ref 13.0–17.0)
Immature Granulocytes: 0 %
Lymphocytes Relative: 36 %
Lymphs Abs: 1.2 10*3/uL (ref 0.7–4.0)
MCH: 26.1 pg (ref 26.0–34.0)
MCHC: 32.2 g/dL (ref 30.0–36.0)
MCV: 81.1 fL (ref 80.0–100.0)
Monocytes Absolute: 0.5 10*3/uL (ref 0.1–1.0)
Monocytes Relative: 14 %
Neutro Abs: 1.6 10*3/uL — ABNORMAL LOW (ref 1.7–7.7)
Neutrophils Relative %: 47 %
Platelets: 191 10*3/uL (ref 150–400)
RBC: 4.29 MIL/uL (ref 4.22–5.81)
RDW: 17.5 % — ABNORMAL HIGH (ref 11.5–15.5)
WBC: 3.4 10*3/uL — ABNORMAL LOW (ref 4.0–10.5)
nRBC: 0 % (ref 0.0–0.2)

## 2021-08-07 LAB — VITAMIN B12: Vitamin B-12: 1404 pg/mL — ABNORMAL HIGH (ref 180–914)

## 2021-08-07 LAB — COMPREHENSIVE METABOLIC PANEL
ALT: 24 U/L (ref 0–44)
AST: 24 U/L (ref 15–41)
Albumin: 2.9 g/dL — ABNORMAL LOW (ref 3.5–5.0)
Alkaline Phosphatase: 100 U/L (ref 38–126)
Anion gap: 5 (ref 5–15)
BUN: 23 mg/dL (ref 8–23)
CO2: 29 mmol/L (ref 22–32)
Calcium: 8.3 mg/dL — ABNORMAL LOW (ref 8.9–10.3)
Chloride: 107 mmol/L (ref 98–111)
Creatinine, Ser: 1.06 mg/dL (ref 0.61–1.24)
GFR, Estimated: >60 mL/min (ref >60)
Glucose, Bld: 88 mg/dL (ref 70–99)
Potassium: 3.6 mmol/L (ref 3.5–5.1)
Sodium: 141 mmol/L (ref 135–145)
Total Bilirubin: 0.5 mg/dL (ref 0.3–1.2)
Total Protein: 6.2 g/dL — ABNORMAL LOW (ref 6.5–8.1)

## 2021-08-07 LAB — TSH: TSH: 0.088 u[IU]/mL — ABNORMAL LOW (ref 0.350–4.500)

## 2021-08-07 LAB — C-REACTIVE PROTEIN: CRP: 2.3 mg/dL — ABNORMAL HIGH (ref <1.0)

## 2021-08-07 LAB — PROCALCITONIN: Procalcitonin: <0.1 ng/mL

## 2021-08-07 LAB — D-DIMER, QUANTITATIVE: D-Dimer, Quant: 3.33 ug/mL-FEU — ABNORMAL HIGH (ref 0.00–0.50)

## 2021-08-07 MED ORDER — PREGABALIN 50 MG PO CAPS
50.0000 mg | ORAL_CAPSULE | Freq: Two times a day (BID) | ORAL | Status: DC
  Administered 2021-08-07 – 2021-08-08 (×3): 50 mg via ORAL
  Filled 2021-08-07 (×3): qty 1

## 2021-08-07 MED ORDER — DICLOFENAC SODIUM 1 % EX GEL
4.0000 g | Freq: Four times a day (QID) | CUTANEOUS | Status: DC
  Administered 2021-08-07 – 2021-08-08 (×4): 4 g via TOPICAL
  Filled 2021-08-07: qty 100

## 2021-08-07 MED ORDER — APIXABAN 5 MG PO TABS
10.0000 mg | ORAL_TABLET | Freq: Two times a day (BID) | ORAL | Status: DC
  Administered 2021-08-07 – 2021-08-08 (×3): 10 mg via ORAL
  Filled 2021-08-07: qty 2
  Filled 2021-08-07 (×2): qty 4

## 2021-08-07 MED ORDER — FLUTICASONE PROPIONATE 50 MCG/ACT NA SUSP
2.0000 | Freq: Every day | NASAL | Status: DC
  Administered 2021-08-07 – 2021-08-08 (×2): 2 via NASAL
  Filled 2021-08-07: qty 16

## 2021-08-07 MED ORDER — APIXABAN 5 MG PO TABS
5.0000 mg | ORAL_TABLET | Freq: Two times a day (BID) | ORAL | Status: DC
Start: 2021-08-14 — End: 2021-08-08

## 2021-08-07 NOTE — Progress Notes (Signed)
PROGRESS NOTE    Rodrigo Pezzullo   [id number]  DOB: 04-13-1946  PCP: Tracie Harrier, MD    DOA: 08/06/2021 LOS: 0    Brief Narrative / Hospital Course to Date:   75 y.o. male with medical history significant for cad s/p pci in 2014 at [facility], hypothyroid, history of thyroidectomy in 2007, history of thyroid cancer, last stress test in 2020, history of DVT was discharged on anticoagulation presents to the emergency department for chief concerns of syncope.  See H&P for detailed history.  Admitted for evaluation of syncope.   Found to be Covid-19+ and being treated with remdesivir.  Assessment & Plan   Principal Problem:   Syncope Active Problems:   Right leg DVT ([place])   COVID-19 virus infection   Essential hypertension   Hyperlipidemia   Hypothyroid   Syncope and collapse -unclear etiology suspect multifactorial.  Patient was hypotensive on arrival so suspect this contributed. Intermittent bradycardia reported on monitor in the ED. Recent echo 05/31/2021 EF 60 to 65%, moderate LVH, grade 1 diastolic dysfunction. --Monitor on telemetry --Check orthostatic vitals daily --Hold antihypertensives --Consider Zio patch if no arrhythmias seen on telemetry here --Fall precautions  Bradycardia -notified by ED RN that patient's heart rate periodically drops into the 40s; most charted HR's in the 22s. -- Coreg is held and heart rate still in 50s and 60s today --Consider cardiology consult --Telemetry  COVID-19 infection -continue remdesivir.  Monitor inflammatory markers.  Patient is not requiring any oxygen and no infiltrates was seen on CTA chest.  Airborne and contact precautions.  Pulmonary hygiene with I-S and flutter.  Right lower extremity DVT -this was diagnosed several months ago in the setting of postop hip surgery.  He apparently was no longer taking Eliquis after the initial 1 week loading dose which was completed on 06/07/2021. DVT seen on Doppler ultrasound this  admission. --Resume Eliquis   Right shoulder pain -chronic.  Patient had just been seen at Ortho yesterday but that was for his hip and the shoulder was not discussed.  He describes weakness and difficulty with ADLs and bed mobility that requires use of his arms. -- Right shoulder x-ray pending --Voltaren gel --Outpatient orthopedic follow-up  Hypotension History of hypertension --Meds are on hold: Coreg, dutasteride, candesartan --Check orthostatic vitals  Peripheral neuropathy -resume Lyrica  CAD -stable with no active chest pain.  Continue aspirin.  Cervical degenerative disc disease and facet degeneration with C4-C5 spinal stenosis -no acute issues.  Monitor.  Hyperlipidemia -on rosuvastatin  Hypothyroidism -on levothyroxine   Patient BMI: Body mass index is 28.37 kg/m.   DVT prophylaxis: Place TED hose Start: 08/06/21 2151 apixaban (ELIQUIS) tablet 10 mg  apixaban (ELIQUIS) tablet 5 mg   Diet:  Diet Orders (From admission, onward)     Start     Ordered   08/06/21 2152  Diet Heart Room service appropriate? Yes; Fluid consistency: Thin  Diet effective now       Question Answer Comment  Room service appropriate? Yes   Fluid consistency: Thin      08/06/21 2151              Code Status: Full Code   Subjective 08/07/21    Patient seen this afternoon just after coming up to the floor from the ED.  He wants to have his right shoulder pain and weakness evaluated.  This is been going on for a long time and he feels something should be done about it while he is  here.  Denies any dizziness or lightheadedness, chest pain or palpitations, cough or shortness of breath.  No other acute complaints.   Disposition Plan & Communication   Status is: Inpatient  Remains inpatient appropriate because: Severity of illness on IV remdesivir for COVID-19 infection and evaluation of syncope is still pending  Dispo: The patient is from: Home              Anticipated d/c is to:  Home              Patient currently is not medically stable to d/c.   Difficult to place patient No   Consults, Procedures, Significant Events   Consultants:  None  Procedures:  None  Antimicrobials:  Anti-infectives (From admission, onward)    Start     Dose/Rate Route Frequency Ordered Stop   08/07/21 1000  remdesivir 100 mg in sodium chloride 0.9 % 100 mL IVPB       See Hyperspace for full Linked Orders Report.   100 mg 200 mL/hr over 30 Minutes Intravenous Daily 08/06/21 2151 08/11/21 0959   08/06/21 2230  remdesivir 200 mg in sodium chloride 0.9% 250 mL IVPB       See Hyperspace for full Linked Orders Report.   200 mg 580 mL/hr over 30 Minutes Intravenous Once 08/06/21 2151 08/07/21 0154         Micro    Objective   Vitals:   08/07/21 0530 08/07/21 0630 08/07/21 0700 08/07/21 1231  BP: 132/62 129/64 (!) 152/87 (!) 148/85  Pulse: (!) 56 (!) 51 76 62  Resp: 15 17 (!) 22 16  Temp:    98.4 F (36.9 C)  TempSrc:      SpO2: 98% 100% 100% 99%  Weight:      Height:       No intake or output data in the 24 hours ending 08/07/21 1425 Filed Weights   08/06/21 1557  Weight: 100.2 kg    Physical Exam:  General exam: awake, alert, no acute distress HEENT: atraumatic, clear conjunctiva, anicteric sclera, moist mucus membranes, hearing grossly normal  Respiratory system: CTAB, no wheezes, rales or rhonchi, normal respiratory effort. Cardiovascular system: normal S1/S2,  RRR  Gastrointestinal system: soft, NT, ND Central nervous system: A&O x4. no gross focal neurologic deficits, normal speech Extremities: Minimal trace lower extremity edema, no right calf tenderness, normal tone Skin: dry, intact, normal temperature Psychiatry: normal mood, congruent affect, judgement and insight appear normal  Labs   Data Reviewed: I have personally reviewed following labs and imaging studies  CBC: Recent Labs  Lab 08/06/21 1607 08/07/21 0658  WBC 4.3 3.4*  NEUTROABS   --  1.6*  HGB 11.0* 11.2*  HCT 33.8* 34.8*  MCV 82.2 81.1  PLT 188 [id number]   Basic Metabolic Panel: Recent Labs  Lab 08/06/21 1607 08/07/21 0658  NA 138 141  K 3.6 3.6  CL 105 107  CO2 26 29  GLUCOSE 101* 88  BUN 29* 23  CREATININE 1.54* 1.06  CALCIUM 8.6* 8.3*   GFR: Estimated Creatinine Clearance: 75 mL/min (by C-G formula based on SCr of 1.06 mg/dL). Liver Function Tests: Recent Labs  Lab 08/07/21 0658  AST 24  ALT 24  ALKPHOS 100  BILITOT 0.5  PROT 6.2*  ALBUMIN 2.9*   No results for input(s): LIPASE, AMYLASE in the last 168 hours. No results for input(s): AMMONIA in the last 168 hours. Coagulation Profile: No results for input(s): INR, PROTIME in the last 168 hours.  Cardiac Enzymes: No results for input(s): CKTOTAL, CKMB, CKMBINDEX, TROPONINI in the last 168 hours. BNP (last 3 results) No results for input(s): PROBNP in the last 8760 hours. HbA1C: No results for input(s): HGBA1C in the last 72 hours. CBG: No results for input(s): GLUCAP in the last 168 hours. Lipid Profile: No results for input(s): CHOL, HDL, LDLCALC, TRIG, CHOLHDL, LDLDIRECT in the last 72 hours. Thyroid Function Tests: Recent Labs    08/07/21 0658  TSH 0.088*   Anemia Panel: Recent Labs    08/07/21 0658  VITAMINB12 1,404*   Sepsis Labs: Recent Labs  Lab 08/06/21 2342  PROCALCITON <0.10    Recent Results (from the past 240 hour(s))  Resp Panel by RT-PCR (Flu A&B, Covid) Nasopharyngeal Swab     Status: Abnormal   Collection Time: 08/06/21  7:58 PM   Specimen: Nasopharyngeal Swab; Nasopharyngeal(NP) swabs in vial transport medium  Result Value Ref Range Status   SARS Coronavirus 2 by RT PCR POSITIVE (A) NEGATIVE Final    Comment: Ronn Melena, RN AT 2105 08/06/21 BY [facility] (NOTE) SARS-CoV-2 target nucleic acids are DETECTED.  The SARS-CoV-2 RNA is generally detectable in upper respiratory specimens during the acute phase of infection. Positive results are indicative of the  presence of the identified virus, but do not rule out bacterial infection or co-infection with other pathogens not detected by the test. Clinical correlation with patient history and other diagnostic information is necessary to determine patient infection status. The expected result is Negative.  Fact Sheet for Patients: EntrepreneurPulse.com.au  Fact Sheet for Healthcare Providers: IncredibleEmployment.be  This test is not yet approved or cleared by the [place] FDA and  has been authorized for detection and/or diagnosis of SARS-CoV-2 by FDA under an Emergency Use Authorization (EUA).  This EUA will remain in effect (meaning this test can be used) for the duration of  the COVID-19  declaration under Section 564(b)(1) of the Act, 21 U.S.C. section 360bbb-3(b)(1), unless the authorization is terminated or revoked sooner.     Influenza A by PCR NEGATIVE NEGATIVE Final   Influenza B by PCR NEGATIVE NEGATIVE Final    Comment: (NOTE) The Xpert Xpress SARS-CoV-2/FLU/RSV plus assay is intended as an aid in the diagnosis of influenza from Nasopharyngeal swab specimens and should not be used as a sole basis for treatment. Nasal washings and aspirates are unacceptable for Xpert Xpress SARS-CoV-2/FLU/RSV testing.  Fact Sheet for Patients: EntrepreneurPulse.com.au  Fact Sheet for Healthcare Providers: IncredibleEmployment.be  This test is not yet approved or cleared by the [place] FDA and has been authorized for detection and/or diagnosis of SARS-CoV-2 by FDA under an Emergency Use Authorization (EUA). This EUA will remain in effect (meaning this test can be used) for the duration of the COVID-19 declaration under Section 564(b)(1) of the Act, 21 U.S.C. section 360bbb-3(b)(1), unless the authorization is terminated or revoked.  Performed at [facility], [address], [place], [place]  [address]       Imaging Studies   DG Chest 2 View  Result Date: 08/06/2021 CLINICAL DATA:  Weakness, syncope EXAM: CHEST - 2 VIEW COMPARISON:  05/31/2021 FINDINGS: Cardiomegaly. Bibasilar atelectasis. No visible effusions or acute bony abnormality. Aortic atherosclerosis. IMPRESSION: Bibasilar atelectasis.  Mild cardiomegaly. Electronically Signed   By: Rolm Baptise M.D.   On: 08/06/2021 19:27   CT Head Wo Contrast  Result Date: 08/06/2021 CLINICAL DATA:  Mental status change, unknown cause Syncopal episode today. EXAM: CT HEAD WITHOUT CONTRAST TECHNIQUE: Contiguous axial images were  obtained from the base of the skull through the vertex without intravenous contrast. COMPARISON:  None. FINDINGS: Brain: No intracranial hemorrhage, mass effect, or midline shift. Brain volume is normal for age. No hydrocephalus. The basilar cisterns are patent. There is mild periventricular and deep white matter hypodensity typical of chronic small vessel ischemia. Remote lacunar infarct in the right caudate. No evidence of territorial infarct or acute ischemia. No extra-axial or intracranial fluid collection. Vascular: No hyperdense vessel. Skull: No fracture or focal lesion. Sinuses/Orbits: Paranasal sinuses and mastoid air cells are clear. The visualized orbits are unremarkable. Other: None. IMPRESSION: 1. No acute intracranial abnormality. 2. Mild chronic small vessel ischemia. Remote lacunar infarct in the right caudate. Electronically Signed   By: Keith Rake M.D.   On: 08/06/2021 19:29   CT Angio Chest PE W/Cm &/Or Wo Cm  Result Date: 08/06/2021 CLINICAL DATA:  PE suspected, low/intermediate prob, positive D-dimer. Syncope, hypotension. EXAM: CT ANGIOGRAPHY CHEST WITH CONTRAST TECHNIQUE: Multidetector CT imaging of the chest was performed using the standard protocol during bolus administration of intravenous contrast. Multiplanar CT image reconstructions and MIPs were obtained to evaluate the vascular anatomy.  CONTRAST:  88m OMNIPAQUE IOHEXOL 350 MG/ML SOLN COMPARISON:  None. FINDINGS: Cardiovascular: There is adequate opacification of the pulmonary arterial tree. No intraluminal filling defect identified to suggest acute pulmonary embolism. The central pulmonary arteries are of normal caliber. Extensive multi-vessel coronary artery calcification. Mild global cardiomegaly. No pericardial effusion. Mild atherosclerotic calcification within the thoracic aorta. No aortic aneurysm. Mediastinum/Nodes: No enlarged mediastinal, hilar, or axillary lymph nodes. Thyroid gland, trachea, and esophagus demonstrate no significant findings. Lungs/Pleura: Lungs are clear. No pleural effusion or pneumothorax. Upper Abdomen: No acute abnormality. Musculoskeletal: No acute bone abnormality. Osseous structures are age-appropriate. Review of the MIP images confirms the above findings. IMPRESSION: No pulmonary embolism. Extensive multi-vessel coronary artery calcification. Mild global cardiomegaly. Aortic Atherosclerosis (ICD10-I70.0). Electronically Signed   By: AFidela SalisburyM.D.   On: 08/06/2021 21:16   MR BRAIN WO CONTRAST  Result Date: 08/07/2021 CLINICAL DATA:  Initial evaluation for acute syncope. COVID positive. EXAM: MRI HEAD WITHOUT CONTRAST TECHNIQUE: Multiplanar, multiecho pulse sequences of the brain and surrounding structures were obtained without intravenous contrast. COMPARISON:  Prior CT from 08/06/2021. FINDINGS: Brain: Examination mildly degraded by motion artifact. Generalized age-related cerebral atrophy. Patchy T2/FLAIR hyperintensity within the periventricular and deep white matter both cerebral hemispheres most consistent chronic small vessel ischemic disease, mild in nature. Remote lacunar infarct present at the right caudate. No abnormal foci of restricted diffusion to suggest acute or subacute ischemia. Gray-white matter differentiation maintained. No encephalomalacia to suggest chronic cortical infarction. No  acute intracranial hemorrhage. Few small chronic micro hemorrhages noted involving the cerebellum and right cerebral hemisphere, likely small vessel/hypertensive in nature. No mass lesion, midline shift or mass effect. No hydrocephalus or extra-axial fluid collection. Pituitary gland suprasellar region within normal limits. Midline structures intact. Vascular: Major intracranial vascular flow voids are maintained. Skull and upper cervical spine: Craniocervical junction within normal limits. Bone marrow signal intensity within normal limits. Small focus of susceptibility artifact noted at the left frontal scalp, corresponding with small calcification and/or foreign body seen on corresponding CT. Scalp soft tissues demonstrate no acute finding. Sinuses/Orbits: Globes and orbital soft tissues demonstrate no acute finding. Mild scattered mucosal thickening noted within the ethmoidal air cells. Paranasal sinuses are otherwise clear. No mastoid effusion. Inner ear structures grossly normal. Other: None. IMPRESSION: 1. No acute intracranial abnormality. 2. Age-related cerebral atrophy with mild chronic  small vessel ischemic disease, with small remote lacunar infarct at the right caudate. Electronically Signed   By: Jeannine Boga M.D.   On: 08/07/2021 01:48   US Venous Img Lower Bilateral (DVT)  Result Date: 08/07/2021 CLINICAL DATA:  Bilateral leg pain and swelling, COVID-19 positivity EXAM: BILATERAL LOWER EXTREMITY VENOUS DOPPLER ULTRASOUND TECHNIQUE: Gray-scale sonography with graded compression, as well as color Doppler and duplex ultrasound were performed to evaluate the lower extremity deep venous systems from the level of the common femoral vein and including the common femoral, femoral, profunda femoral, popliteal and calf veins including the posterior tibial, peroneal and gastrocnemius veins when visible. The superficial great saphenous vein was also interrogated. Spectral Doppler was utilized to  evaluate flow at rest and with distal augmentation maneuvers in the common femoral, femoral and popliteal veins. COMPARISON:  None. FINDINGS: RIGHT LOWER EXTREMITY Common Femoral Vein: No evidence of thrombus. Normal compressibility, respiratory phasicity and response to augmentation. Saphenofemoral Junction: No evidence of thrombus. Normal compressibility and flow on color Doppler imaging. Profunda Femoral Vein: No evidence of thrombus. Normal compressibility and flow on color Doppler imaging. Femoral Vein: No evidence of thrombus. Normal compressibility, respiratory phasicity and response to augmentation. Popliteal Vein: No evidence of thrombus. Normal compressibility, respiratory phasicity and response to augmentation. Calf Veins: Thrombus is noted within the posterior tibial vein with decreased compressibility. Superficial Great Saphenous Vein: No evidence of thrombus. Normal compressibility. Venous Reflux:  None. Other Findings:  None. LEFT LOWER EXTREMITY Common Femoral Vein: No evidence of thrombus. Normal compressibility, respiratory phasicity and response to augmentation. Saphenofemoral Junction: No evidence of thrombus. Normal compressibility and flow on color Doppler imaging. Profunda Femoral Vein: No evidence of thrombus. Normal compressibility and flow on color Doppler imaging. Femoral Vein: No evidence of thrombus. Normal compressibility, respiratory phasicity and response to augmentation. Popliteal Vein: No evidence of thrombus. Normal compressibility, respiratory phasicity and response to augmentation. Calf Veins: No evidence of thrombus. Normal compressibility and flow on color Doppler imaging. Superficial Great Saphenous Vein: No evidence of thrombus. Normal compressibility. Venous Reflux:  None. Other Findings:  None. IMPRESSION: Deep venous thrombosis within the right posterior tibial vein. No other focal abnormality is noted. Electronically Signed   By: Inez Catalina M.D.   On: 08/07/2021 03:12    DG Shoulder Right Port  Result Date: 08/07/2021 CLINICAL DATA:  Right shoulder pain EXAM: PORTABLE RIGHT SHOULDER COMPARISON:  None. FINDINGS: No acute fracture or dislocation. Moderate degenerative changes of the right shoulder joint and acromioclavicular joint. Soft tissues are unremarkable. IMPRESSION: Moderate degenerative changes of the right shoulder joint and acromioclavicular joint. Narrowing of the acromial humeral interval which can be seen in the setting of chronic rotator cuff pathology. Electronically Signed   By: Yetta Glassman M.D.   On: 08/07/2021 14:05     Medications   Scheduled Meds:  allopurinol  100 mg Oral Daily   apixaban  10 mg Oral BID   Followed by   Derrill Memo ON 08/14/2021] apixaban  5 mg Oral BID   aspirin EC  81 mg Oral Daily   diclofenac Sodium  4 g Topical QID   docusate sodium  200 mg Oral BID   fluticasone  2 spray Each Nare Daily   levothyroxine  137 mcg Oral QAC breakfast   pregabalin  50 mg Oral BID   rosuvastatin  40 mg Oral QHS   Continuous Infusions:  remdesivir 100 mg in NS 100 mL Stopped (08/07/21 1117)       LOS: 0  days    Time spent: 30 minutes    Ezekiel Slocumb, DO [facility]  08/07/2021, 2:25 PM      If 7PM-7AM, please contact night-coverage. How to contact the [facility] Attending or Consulting provider [place] or covering provider during after hours [place], for this patient?    Check the care team in [facility] and look for a) attending/consulting [facility] provider listed and b) the [facility] team listed Log into [email or web address] and use [place]'s universal password to access. If you do not have the password, please contact the hospital operator. Locate the [facility] provider you are looking for under [facility] and page to a number that you can be directly reached. If you still have difficulty reaching the provider, please page the [facility] (Director on Call) for the Hospitalists listed on amion for assistance.

## 2021-08-07 NOTE — ED Notes (Signed)
Pt wife took both of pt hearing aides

## 2021-08-07 NOTE — Progress Notes (Signed)
Report given to accepting RN. Patient to be transferred to room.   Patient requesting flonase, message sent to MD.

## 2021-08-07 NOTE — Progress Notes (Signed)
Patient A&Ox4, frustrated with needing some cough medicine, urinal emptied, breakfast, and tissues. Gave PRN cough medicine, tissues, emptied urinal, explained process of breakfast. Verbalized understanding.

## 2021-08-07 NOTE — Progress Notes (Signed)
Breakfast tray taken to patient. Assisted with coffee, grits etc. Patient c/o shoulder issues stating that he cannot raise his arm and this has been going on for awhile and that he was supposed to get an MRI of it. Requested to use phone to call his MD at [facility]. Phone given to patient. Will notify MD of patient's complaints.

## 2021-08-07 NOTE — Progress Notes (Signed)
Patient has multiple complaints such as "where is the doctor, am I going to be in a room any bigger than this, I bet the people with all the money get the bigger rooms".  This RN assured patient that MD has been notified of his requests. Patient began to pull down mask and I asked him to please pull it up to which he responded he would for now but that he was congested. Informed patient that I sent a message to ask for flonase.   Patient requesting his wife's phone number. I attempted to call his wife to update her, left voicemail. Wrote wife's number on a piece of paper and gave to patient. Patient becoming increasingly frustrated and aggravated.

## 2021-08-08 DIAGNOSIS — M12811 Other specific arthropathies, not elsewhere classified, right shoulder: Secondary | ICD-10-CM | POA: Diagnosis not present

## 2021-08-08 LAB — CBC WITH DIFFERENTIAL/PLATELET
Abs Immature Granulocytes: 0 10*3/uL (ref 0.00–0.07)
Basophils Absolute: 0 10*3/uL (ref 0.0–0.1)
Basophils Relative: 0 %
Eosinophils Absolute: 0.1 10*3/uL (ref 0.0–0.5)
Eosinophils Relative: 3 %
HCT: 34.7 % — ABNORMAL LOW (ref 39.0–52.0)
Hemoglobin: 11.2 g/dL — ABNORMAL LOW (ref 13.0–17.0)
Immature Granulocytes: 0 %
Lymphocytes Relative: 33 %
Lymphs Abs: 1.1 10*3/uL (ref 0.7–4.0)
MCH: 26.4 pg (ref 26.0–34.0)
MCHC: 32.3 g/dL (ref 30.0–36.0)
MCV: 81.6 fL (ref 80.0–100.0)
Monocytes Absolute: 0.4 10*3/uL (ref 0.1–1.0)
Monocytes Relative: 13 %
Neutro Abs: 1.7 10*3/uL (ref 1.7–7.7)
Neutrophils Relative %: 51 %
Platelets: 188 10*3/uL (ref 150–400)
RBC: 4.25 MIL/uL (ref 4.22–5.81)
RDW: 17.1 % — ABNORMAL HIGH (ref 11.5–15.5)
WBC: 3.3 10*3/uL — ABNORMAL LOW (ref 4.0–10.5)
nRBC: 0 % (ref 0.0–0.2)

## 2021-08-08 LAB — COMPREHENSIVE METABOLIC PANEL
ALT: 25 U/L (ref 0–44)
AST: 26 U/L (ref 15–41)
Albumin: 3.3 g/dL — ABNORMAL LOW (ref 3.5–5.0)
Alkaline Phosphatase: 103 U/L (ref 38–126)
Anion gap: 6 (ref 5–15)
BUN: 17 mg/dL (ref 8–23)
CO2: 28 mmol/L (ref 22–32)
Calcium: 8.5 mg/dL — ABNORMAL LOW (ref 8.9–10.3)
Chloride: 103 mmol/L (ref 98–111)
Creatinine, Ser: 0.91 mg/dL (ref 0.61–1.24)
GFR, Estimated: >60 mL/min (ref >60)
Glucose, Bld: 89 mg/dL (ref 70–99)
Potassium: 3.2 mmol/L — ABNORMAL LOW (ref 3.5–5.1)
Sodium: 137 mmol/L (ref 135–145)
Total Bilirubin: 0.7 mg/dL (ref 0.3–1.2)
Total Protein: 6.2 g/dL — ABNORMAL LOW (ref 6.5–8.1)

## 2021-08-08 LAB — D-DIMER, QUANTITATIVE: D-Dimer, Quant: 4.4 ug/mL-FEU — ABNORMAL HIGH (ref 0.00–0.50)

## 2021-08-08 LAB — T4, FREE: Free T4: 1.21 ng/dL — ABNORMAL HIGH (ref 0.61–1.12)

## 2021-08-08 LAB — MAGNESIUM: Magnesium: 2.1 mg/dL (ref 1.7–2.4)

## 2021-08-08 LAB — C-REACTIVE PROTEIN: CRP: 1.6 mg/dL — ABNORMAL HIGH (ref <1.0)

## 2021-08-08 MED ORDER — APIXABAN 5 MG PO TABS: ORAL_TABLET | ORAL | 2 refills | Status: DC

## 2021-08-08 MED ORDER — ROSUVASTATIN CALCIUM 40 MG PO TABS: 40.0000 mg | ORAL_TABLET | Freq: Every day | ORAL | Status: AC

## 2021-08-08 MED ORDER — METOPROLOL SUCCINATE ER 25 MG PO TB24: 12.5000 mg | ORAL_TABLET | Freq: Every day | ORAL | Status: DC

## 2021-08-08 MED ORDER — IRBESARTAN 150 MG PO TABS
150.0000 mg | ORAL_TABLET | Freq: Every day | ORAL | Status: DC
  Administered 2021-08-08: 150 mg via ORAL
  Filled 2021-08-08: qty 1

## 2021-08-08 MED ORDER — DUTASTERIDE 0.5 MG PO CAPS
0.5000 mg | ORAL_CAPSULE | Freq: Every day | ORAL | Status: DC
  Administered 2021-08-08: 0.5 mg via ORAL
  Filled 2021-08-08: qty 1

## 2021-08-08 MED ORDER — POTASSIUM CHLORIDE CRYS ER 20 MEQ PO TBCR
40.0000 meq | EXTENDED_RELEASE_TABLET | Freq: Once | ORAL | Status: AC
  Administered 2021-08-08: 10:00:00 40 meq via ORAL
  Filled 2021-08-08: qty 2

## 2021-08-08 MED ORDER — TRAMADOL HCL 50 MG PO TABS
50.0000 mg | ORAL_TABLET | Freq: Four times a day (QID) | ORAL | Status: DC | PRN
Start: 2021-08-08 — End: 2021-08-08

## 2021-08-08 NOTE — Discharge Summary (Signed)
Physician Discharge Summary  Keith Williamson [id number] DOB: 01-23-1946 DOA: 08/06/2021  PCP: Tracie Harrier, MD  Admit date: 08/06/2021 Discharge date: 08/08/2021  Admitted From: home Disposition:  home  Recommendations for Outpatient Follow-up:  Follow up with PCP in 1-2 weeks Please obtain BMP/CBC in one week Please follow up with Cardiology Please follow up with Orthopedic surgery for right shoulder evaluation  Home Health: No  Equipment/Devices: None   Discharge Condition: Stable  CODE STATUS: Full  Diet recommendation: Heart Healthy      Discharge Diagnoses: Principal Problem:   Syncope Active Problems:   Right leg DVT ([place])   COVID-19 virus infection   Essential hypertension   Hyperlipidemia   Hypothyroid    Summary of HPI and Hospital Course:  75 y.o. male with medical history significant for cad s/p pci in 2014 at [facility], hypothyroid, history of thyroidectomy in 2007, history of thyroid cancer, last stress test in 2020, history of DVT was discharged on anticoagulation presents to the emergency department for chief concerns of syncope.  See H&P for detailed history.   Admitted for evaluation of syncope.   Found to be Covid-19+ and was treated with 3 days of remdesivir.  Pt had not Covid-specific symptoms and declined full 5 day course of remdesivir.    A&P  Syncope and collapse - multifactorial.  Patient was hypotensive on arrival so suspect this was primary cause.  Also had intermittent bradycardia reported on monitor in the ED. Recent echo 05/31/2021 EF 60 to 65%, moderate LVH, grade 1 diastolic dysfunction. --Monitor on telemetry --Orthostatic vitals negative --Antihypertensives HELD --Follow up with Cardiology for possible Zio patch  --Fall precautions   Bradycardia -notified by ED RN that patient's heart rate periodically drops into the 40s; most charted HR's in the 35s. -- Coreg is held and heart rate still in 50s and 60s today --Cardiology  consulted and agreed with holding beta blocker as a precaution and follow up outpatient with cardiology as previously scheduled. --Telemetry reviewed by Cardiology - no significant events noted.   COVID-19 infection -Completed 3 days of remdesivir.   Pt did not require any oxygen and no infiltrates was seen on CTA chest, no respiratory symptoms.    Right lower extremity DVT -this was diagnosed several months ago in the setting of postop hip surgery.  He apparently was no longer taking Eliquis after the initial 1 week loading dose which was completed on 06/07/2021. DVT seen on Doppler ultrasound this admission. --Resumed Eliquis per PE protocol - will re-load x7 days since that was two months ago now.   Right shoulder pain -chronic.  Patient had just been seen at Ortho day before admission, but that was for his hip and the shoulder was not discussed.  He describes weakness and difficulty with ADLs and bed mobility that requires use of his arms. --Right shoulder x-ray findings consistent with likely rotator cuff pathology --Seen by Dr. Roland Rack, orthopedic surgeon --Outpatient orthopedic follow-up --Voltaren gel   Hypotension History of hypertension --Meds HELD: Coreg, dutasteride, candesartan Resumed on candesartan and dutasteride. Cored held at d/c due to bradycardia and presenting hypotensive and bradycardic. Cardiology follow up outpatient.   Peripheral neuropathy -resume Lyrica   CAD -stable with no active chest pain.  Continue aspirin.  Cervical degenerative disc disease and facet degeneration with C4-C5 spinal stenosis -no acute issues.  Monitor.  Hyperlipidemia -on rosuvastatin  Hypothyroidism -on levothyroxine     Discharge Instructions   Discharge Instructions     Call MD for:  extreme fatigue   Complete by: As directed    Call MD for:  persistant dizziness or light-headedness   Complete by: As directed    Call MD for:  persistant nausea and vomiting   Complete by: As  directed    Call MD for:  severe uncontrolled pain   Complete by: As directed    Call MD for:  temperature >100.4   Complete by: As directed    Diet - low sodium heart healthy   Complete by: As directed    Discharge instructions   Complete by: As directed    Your passing out episode was most likely due to your heart rate and blood pressure both being low.  For right now, we have stopped your beta blocker (carvedilol, aka Coreg) because it lowers the heart rate and the blood pressure.  Follow up with Dr. Erin Fulling in clinic to discuss this change and if you should be restarted on it or any other medications.  If you have a blood pressure monitor at home, recommend checking your BP before you take your medication (candesartan).  If the top number is less than 120 or the bottom number less than 60, you should skip taking that dose to prevent your BP from dropping too low.    Follow up in Orthopedic clinic about your right shoulder pain and weakness.   They will schedule you for an MRI to evaluate the Rotator Cuff muscles of your shoulder.   On the xray you have arthritis in the shoulder and some joint narrowing that is seen with Rotator Cuff issues.   You most likely have a strained or torn muscle.   Increase activity slowly   Complete by: As directed       Allergies as of 08/08/2021       Reactions   Amlodipine Swelling   Tongue swelling Other reaction(s): SWELLING   Levsin [hyoscyamine] Anaphylaxis, Swelling, Rash   Beta Adrenergic Blockers Other (See Comments)   unknown   Hydralazine    Hydrochlorothiazide Other (See Comments)   "draws out too much fluid"   Other    Influenza virus vaccine tvs 2012-13 (18+) cell der   Zetia [ezetimibe]    Muscle spasms, numbness, tingling         Medication List     STOP taking these medications    carvedilol 3.125 MG tablet Commonly known as: COREG   gabapentin 300 MG capsule Commonly known as: NEURONTIN       TAKE these  medications    allopurinol 100 MG tablet Commonly known as: ZYLOPRIM Take 100 mg by mouth daily.   apixaban 5 MG Tabs tablet Commonly known as: ELIQUIS Take 2 tablets (10 mg total) by mouth 2 (two) times daily for 5 days, THEN 1 tablet (5 mg total) 2 (two) times daily. Start taking on: August 08, 2021   aspirin 81 MG tablet Take 81 mg by mouth daily.   candesartan 32 MG tablet Commonly known as: ATACAND Take 32 mg by mouth daily.   docusate sodium 100 MG capsule Commonly known as: COLACE Take 2 capsules (200 mg total) by mouth 2 (two) times daily.   dutasteride 0.5 MG capsule Commonly known as: AVODART Take 0.5 mg by mouth daily.   fluticasone 50 MCG/ACT nasal spray Commonly known as: FLONASE Place 2 sprays into both nostrils daily.   levothyroxine 137 MCG tablet Commonly known as: SYNTHROID Take 137 mcg by mouth daily before breakfast.   multivitamin with minerals tablet  Take 1 tablet by mouth daily.   nitroGLYCERIN 0.4 MG SL tablet Commonly known as: NITROSTAT Place 0.4 mg under the tongue every 5 (five) minutes as needed for chest pain.   pregabalin 50 MG capsule Commonly known as: LYRICA Take 50 mg by mouth 2 (two) times daily.   rosuvastatin 40 MG tablet Commonly known as: CRESTOR Take 1 tablet (40 mg total) by mouth at bedtime. What changed: when to take this        Allergies  Allergen Reactions   Amlodipine Swelling    Tongue swelling Other reaction(s): SWELLING   Levsin [Hyoscyamine] Anaphylaxis, Swelling and Rash   Beta Adrenergic Blockers Other (See Comments)    unknown   Hydralazine    Hydrochlorothiazide Other (See Comments)    "draws out too much fluid"   Other     Influenza virus vaccine tvs 2012-13 (18+) cell der   Zetia [Ezetimibe]     Muscle spasms, numbness, tingling      If you experience worsening of your admission symptoms, develop shortness of breath, life threatening emergency, suicidal or homicidal thoughts you must seek  medical attention immediately by calling 911 or calling your MD immediately  if symptoms less severe.    Please note   You were cared for by a hospitalist during your hospital stay. If you have any questions about your discharge medications or the care you received while you were in the hospital after you are discharged, you can call the unit and asked to speak with the hospitalist on call if the hospitalist that took care of you is not available. Once you are discharged, your primary care physician will handle any further medical issues. Please note that NO REFILLS for any discharge medications will be authorized once you are discharged, as it is imperative that you return to your primary care physician (or establish a relationship with a primary care physician if you do not have one) for your aftercare needs so that they can reassess your need for medications and monitor your lab values.   Consultations: Cardiology Orthopedic surgery    Procedures/Studies: DG Chest 2 View  Result Date: 08/06/2021 CLINICAL DATA:  Weakness, syncope EXAM: CHEST - 2 VIEW COMPARISON:  05/31/2021 FINDINGS: Cardiomegaly. Bibasilar atelectasis. No visible effusions or acute bony abnormality. Aortic atherosclerosis. IMPRESSION: Bibasilar atelectasis.  Mild cardiomegaly. Electronically Signed   By: Rolm Baptise M.D.   On: 08/06/2021 19:27   CT Head Wo Contrast  Result Date: 08/06/2021 CLINICAL DATA:  Mental status change, unknown cause Syncopal episode today. EXAM: CT HEAD WITHOUT CONTRAST TECHNIQUE: Contiguous axial images were obtained from the base of the skull through the vertex without intravenous contrast. COMPARISON:  None. FINDINGS: Brain: No intracranial hemorrhage, mass effect, or midline shift. Brain volume is normal for age. No hydrocephalus. The basilar cisterns are patent. There is mild periventricular and deep white matter hypodensity typical of chronic small vessel ischemia. Remote lacunar infarct in the  right caudate. No evidence of territorial infarct or acute ischemia. No extra-axial or intracranial fluid collection. Vascular: No hyperdense vessel. Skull: No fracture or focal lesion. Sinuses/Orbits: Paranasal sinuses and mastoid air cells are clear. The visualized orbits are unremarkable. Other: None. IMPRESSION: 1. No acute intracranial abnormality. 2. Mild chronic small vessel ischemia. Remote lacunar infarct in the right caudate. Electronically Signed   By: Keith Rake M.D.   On: 08/06/2021 19:29   CT Angio Chest PE W/Cm &/Or Wo Cm  Result Date: 08/06/2021 CLINICAL DATA:  PE suspected,  low/intermediate prob, positive D-dimer. Syncope, hypotension. EXAM: CT ANGIOGRAPHY CHEST WITH CONTRAST TECHNIQUE: Multidetector CT imaging of the chest was performed using the standard protocol during bolus administration of intravenous contrast. Multiplanar CT image reconstructions and MIPs were obtained to evaluate the vascular anatomy. CONTRAST:  67m OMNIPAQUE IOHEXOL 350 MG/ML SOLN COMPARISON:  None. FINDINGS: Cardiovascular: There is adequate opacification of the pulmonary arterial tree. No intraluminal filling defect identified to suggest acute pulmonary embolism. The central pulmonary arteries are of normal caliber. Extensive multi-vessel coronary artery calcification. Mild global cardiomegaly. No pericardial effusion. Mild atherosclerotic calcification within the thoracic aorta. No aortic aneurysm. Mediastinum/Nodes: No enlarged mediastinal, hilar, or axillary lymph nodes. Thyroid gland, trachea, and esophagus demonstrate no significant findings. Lungs/Pleura: Lungs are clear. No pleural effusion or pneumothorax. Upper Abdomen: No acute abnormality. Musculoskeletal: No acute bone abnormality. Osseous structures are age-appropriate. Review of the MIP images confirms the above findings. IMPRESSION: No pulmonary embolism. Extensive multi-vessel coronary artery calcification. Mild global cardiomegaly. Aortic  Atherosclerosis (ICD10-I70.0). Electronically Signed   By: AFidela SalisburyM.D.   On: 08/06/2021 21:16   MR BRAIN WO CONTRAST  Result Date: 08/07/2021 CLINICAL DATA:  Initial evaluation for acute syncope. COVID positive. EXAM: MRI HEAD WITHOUT CONTRAST TECHNIQUE: Multiplanar, multiecho pulse sequences of the brain and surrounding structures were obtained without intravenous contrast. COMPARISON:  Prior CT from 08/06/2021. FINDINGS: Brain: Examination mildly degraded by motion artifact. Generalized age-related cerebral atrophy. Patchy T2/FLAIR hyperintensity within the periventricular and deep white matter both cerebral hemispheres most consistent chronic small vessel ischemic disease, mild in nature. Remote lacunar infarct present at the right caudate. No abnormal foci of restricted diffusion to suggest acute or subacute ischemia. Gray-white matter differentiation maintained. No encephalomalacia to suggest chronic cortical infarction. No acute intracranial hemorrhage. Few small chronic micro hemorrhages noted involving the cerebellum and right cerebral hemisphere, likely small vessel/hypertensive in nature. No mass lesion, midline shift or mass effect. No hydrocephalus or extra-axial fluid collection. Pituitary gland suprasellar region within normal limits. Midline structures intact. Vascular: Major intracranial vascular flow voids are maintained. Skull and upper cervical spine: Craniocervical junction within normal limits. Bone marrow signal intensity within normal limits. Small focus of susceptibility artifact noted at the left frontal scalp, corresponding with small calcification and/or foreign body seen on corresponding CT. Scalp soft tissues demonstrate no acute finding. Sinuses/Orbits: Globes and orbital soft tissues demonstrate no acute finding. Mild scattered mucosal thickening noted within the ethmoidal air cells. Paranasal sinuses are otherwise clear. No mastoid effusion. Inner ear structures grossly  normal. Other: None. IMPRESSION: 1. No acute intracranial abnormality. 2. Age-related cerebral atrophy with mild chronic small vessel ischemic disease, with small remote lacunar infarct at the right caudate. Electronically Signed   By: BJeannine BogaM.D.   On: 08/07/2021 01:48   MR CERVICAL SPINE WO CONTRAST  Result Date: 08/03/2021 CLINICAL DATA:  Paresthesia. Neck and bilateral arm pain. Numbness in the fingers of both hands. EXAM: MRI CERVICAL SPINE WITHOUT CONTRAST TECHNIQUE: Multiplanar, multisequence MR imaging of the cervical spine was performed. No intravenous contrast was administered. COMPARISON:  None. FINDINGS: Alignment: Minimal retrolisthesis of C3 on C4. Minimal anterolisthesis of C7 on T1 and T1 on T2. Vertebrae: No fracture or suspicious marrow lesion. Mild right-sided facet edema at C7-T1 which appears degenerative. Cord: Abnormal T2 hyperintensity in the spinal cord bilaterally at C3-4 associated with spondylosis. Posterior Fossa, vertebral arteries, paraspinal tissues: Unremarkable. Disc levels: C2-3: Moderate to severe right and mild left facet arthrosis without stenosis. C3-4: Retrolisthesis with bulging uncovered  disc, a central disc protrusion, uncovertebral spurring, infolding of the ligamentum flavum, and severe facet arthrosis result in severe spinal stenosis with moderate cord flattening and moderate to severe right greater than left neural foraminal stenosis. C4-5: Disc bulging, a small right central disc protrusion, uncovertebral spurring, infolding of the ligamentum flavum, and severe facet arthrosis result in moderate spinal stenosis and severe bilateral neural foraminal stenosis. C5-6: Disc bulging, uncovertebral spurring, and severe facet arthrosis result in mild spinal stenosis and moderate to severe right and severe left neural foraminal stenosis. C6-7: Disc bulging, uncovertebral spurring, and severe facet arthrosis result in mild spinal stenosis and moderate to severe  bilateral neural foraminal stenosis. C7-T1: Anterolisthesis with disc uncovering, infolding of the ligamentum flavum, and severe facet arthrosis result in mild spinal stenosis and severe bilateral neural foraminal stenosis. T1-2: Only imaged sagittally. Anterolisthesis with disc uncovering and severe facet arthrosis result in mild bilateral neural foraminal stenosis without spinal stenosis. T2-3: Only imaged sagittally. A small left foraminal disc protrusion with annular fissure and severe facet arthrosis result in moderate left neural foraminal stenosis. No spinal stenosis. IMPRESSION: 1. Diffuse cervical disc and facet degeneration, most notable at C3-4 where there is severe spinal stenosis and spinal cord signal abnormality compatible with edema or myelomalacia. 2. Moderate spinal stenosis at C4-5. 3. Severe multilevel neural foraminal stenosis as above. Electronically Signed   By: Logan Bores M.D.   On: 08/03/2021 14:48   US Venous Img Lower Bilateral (DVT)  Result Date: 08/07/2021 CLINICAL DATA:  Bilateral leg pain and swelling, COVID-19 positivity EXAM: BILATERAL LOWER EXTREMITY VENOUS DOPPLER ULTRASOUND TECHNIQUE: Gray-scale sonography with graded compression, as well as color Doppler and duplex ultrasound were performed to evaluate the lower extremity deep venous systems from the level of the common femoral vein and including the common femoral, femoral, profunda femoral, popliteal and calf veins including the posterior tibial, peroneal and gastrocnemius veins when visible. The superficial great saphenous vein was also interrogated. Spectral Doppler was utilized to evaluate flow at rest and with distal augmentation maneuvers in the common femoral, femoral and popliteal veins. COMPARISON:  None. FINDINGS: RIGHT LOWER EXTREMITY Common Femoral Vein: No evidence of thrombus. Normal compressibility, respiratory phasicity and response to augmentation. Saphenofemoral Junction: No evidence of thrombus. Normal  compressibility and flow on color Doppler imaging. Profunda Femoral Vein: No evidence of thrombus. Normal compressibility and flow on color Doppler imaging. Femoral Vein: No evidence of thrombus. Normal compressibility, respiratory phasicity and response to augmentation. Popliteal Vein: No evidence of thrombus. Normal compressibility, respiratory phasicity and response to augmentation. Calf Veins: Thrombus is noted within the posterior tibial vein with decreased compressibility. Superficial Great Saphenous Vein: No evidence of thrombus. Normal compressibility. Venous Reflux:  None. Other Findings:  None. LEFT LOWER EXTREMITY Common Femoral Vein: No evidence of thrombus. Normal compressibility, respiratory phasicity and response to augmentation. Saphenofemoral Junction: No evidence of thrombus. Normal compressibility and flow on color Doppler imaging. Profunda Femoral Vein: No evidence of thrombus. Normal compressibility and flow on color Doppler imaging. Femoral Vein: No evidence of thrombus. Normal compressibility, respiratory phasicity and response to augmentation. Popliteal Vein: No evidence of thrombus. Normal compressibility, respiratory phasicity and response to augmentation. Calf Veins: No evidence of thrombus. Normal compressibility and flow on color Doppler imaging. Superficial Great Saphenous Vein: No evidence of thrombus. Normal compressibility. Venous Reflux:  None. Other Findings:  None. IMPRESSION: Deep venous thrombosis within the right posterior tibial vein. No other focal abnormality is noted. Electronically Signed   By: Inez Catalina  M.D.   On: 08/07/2021 03:12   DG Shoulder Right Port  Result Date: 08/07/2021 CLINICAL DATA:  Right shoulder pain EXAM: PORTABLE RIGHT SHOULDER COMPARISON:  None. FINDINGS: No acute fracture or dislocation. Moderate degenerative changes of the right shoulder joint and acromioclavicular joint. Soft tissues are unremarkable. IMPRESSION: Moderate degenerative changes  of the right shoulder joint and acromioclavicular joint. Narrowing of the acromial humeral interval which can be seen in the setting of chronic rotator cuff pathology. Electronically Signed   By: Yetta Glassman M.D.   On: 08/07/2021 14:05     Subjective: Pt feels better this AM.  He wants to go home today.   Denies CP, cough, SOB or other complaints.  Shoulder feeling okay today.  No acute complaints.    Discharge Exam: Vitals:   08/08/21 1021 08/08/21 1025  BP: (!) 183/103 (!) 153/103  Pulse: 77 76  Resp:    Temp:    SpO2:     Vitals:   08/08/21 1016 08/08/21 1018 08/08/21 1021 08/08/21 1025  BP: (!) 176/103 (!) 164/96 (!) 183/103 (!) 153/103  Pulse: 73 76 77 76  Resp:      Temp:      TempSrc:      SpO2: 100%     Weight:      Height:        General: Pt is alert, awake, not in acute distress Cardiovascular: RRR, S1/S2 +, no rubs, no gallops Respiratory: CTA bilaterally, no wheezing, no rhonchi Abdominal: Soft, NT, ND, bowel sounds + Extremities: no edema, no cyanosis    The results of significant diagnostics from this hospitalization (including imaging, microbiology, ancillary and laboratory) are listed below for reference.     Microbiology: Recent Results (from the past 240 hour(s))  Resp Panel by RT-PCR (Flu A&B, Covid) Nasopharyngeal Swab     Status: Abnormal   Collection Time: 08/06/21  7:58 PM   Specimen: Nasopharyngeal Swab; Nasopharyngeal(NP) swabs in vial transport medium  Result Value Ref Range Status   SARS Coronavirus 2 by RT PCR POSITIVE (A) NEGATIVE Final    Comment: Ronn Melena, RN AT 2105 08/06/21 BY [facility] (NOTE) SARS-CoV-2 target nucleic acids are DETECTED.  The SARS-CoV-2 RNA is generally detectable in upper respiratory specimens during the acute phase of infection. Positive results are indicative of the presence of the identified virus, but do not rule out bacterial infection or co-infection with other pathogens not detected by the test.  Clinical correlation with patient history and other diagnostic information is necessary to determine patient infection status. The expected result is Negative.  Fact Sheet for Patients: EntrepreneurPulse.com.au  Fact Sheet for Healthcare Providers: IncredibleEmployment.be  This test is not yet approved or cleared by the [place] FDA and  has been authorized for detection and/or diagnosis of SARS-CoV-2 by FDA under an Emergency Use Authorization (EUA).  This EUA will remain in effect (meaning this test can be used) for the duration of  the COVID-19  declaration under Section 564(b)(1) of the Act, 21 U.S.C. section 360bbb-3(b)(1), unless the authorization is terminated or revoked sooner.     Influenza A by PCR NEGATIVE NEGATIVE Final   Influenza B by PCR NEGATIVE NEGATIVE Final    Comment: (NOTE) The Xpert Xpress SARS-CoV-2/FLU/RSV plus assay is intended as an aid in the diagnosis of influenza from Nasopharyngeal swab specimens and should not be used as a sole basis for treatment. Nasal washings and aspirates are unacceptable for Xpert Xpress SARS-CoV-2/FLU/RSV testing.  Fact Sheet for Patients: EntrepreneurPulse.com.au  Fact Sheet for Healthcare Providers: IncredibleEmployment.be  This test is not yet approved or cleared by the [place] FDA and has been authorized for detection and/or diagnosis of SARS-CoV-2 by FDA under an Emergency Use Authorization (EUA). This EUA will remain in effect (meaning this test can be used) for the duration of the COVID-19 declaration under Section 564(b)(1) of the Act, 21 U.S.C. section 360bbb-3(b)(1), unless the authorization is terminated or revoked.  Performed at [facility], [address] [place], [place] [address]      Labs: BNP (last 3 results) Recent Labs    04/30/21 1719 05/31/21 0015  BNP 46.1 [id number]   Basic Metabolic Panel: Recent  Labs  Lab 08/06/21 1607 08/07/21 0658 08/08/21 0531  NA 138 141 137  K 3.6 3.6 3.2*  CL 105 107 103  CO2 '26 29 28  '$ GLUCOSE 101* 88 89  BUN 29* 23 17  CREATININE 1.54* 1.06 0.91  CALCIUM 8.6* 8.3* 8.5*  MG  --   --  2.1   Liver Function Tests: Recent Labs  Lab 08/07/21 0658 08/08/21 0531  AST 24 26  ALT 24 25  ALKPHOS 100 103  BILITOT 0.5 0.7  PROT 6.2* 6.2*  ALBUMIN 2.9* 3.3*   No results for input(s): LIPASE, AMYLASE in the last 168 hours. No results for input(s): AMMONIA in the last 168 hours. CBC: Recent Labs  Lab 08/06/21 1607 08/07/21 0658 08/08/21 0531  WBC 4.3 3.4* 3.3*  NEUTROABS  --  1.6* 1.7  HGB 11.0* 11.2* 11.2*  HCT 33.8* 34.8* 34.7*  MCV 82.2 81.1 81.6  PLT 188 191 188   Cardiac Enzymes: No results for input(s): CKTOTAL, CKMB, CKMBINDEX, TROPONINI in the last 168 hours. BNP: Invalid input(s): POCBNP CBG: No results for input(s): GLUCAP in the last 168 hours. D-Dimer Recent Labs    08/07/21 0658 08/08/21 0531  DDIMER 3.33* 4.40*   Hgb A1c No results for input(s): HGBA1C in the last 72 hours. Lipid Profile No results for input(s): CHOL, HDL, LDLCALC, TRIG, CHOLHDL, LDLDIRECT in the last 72 hours. Thyroid function studies Recent Labs    08/07/21 0658  TSH 0.088*   Anemia work up Recent Labs    08/07/21 0658  VITAMINB12 1,404*   Urinalysis    Component Value Date/Time   COLORURINE YELLOW (A) 08/06/2021 1958   APPEARANCEUR HAZY (A) 08/06/2021 1958   LABSPEC 1.017 08/06/2021 1958   PHURINE 5.0 08/06/2021 1958   GLUCOSEU NEGATIVE 08/06/2021 1958   HGBUR NEGATIVE 08/06/2021 1958   BILIRUBINUR NEGATIVE 08/06/2021 [place] NEGATIVE 08/06/2021 1958   PROTEINUR NEGATIVE 08/06/2021 1958   NITRITE NEGATIVE 08/06/2021 1958   LEUKOCYTESUR NEGATIVE 08/06/2021 1958   Sepsis Labs Invalid input(s): PROCALCITONIN,  WBC,  LACTICIDVEN Microbiology Recent Results (from the past 240 hour(s))  Resp Panel by RT-PCR (Flu A&B, Covid)  Nasopharyngeal Swab     Status: Abnormal   Collection Time: 08/06/21  7:58 PM   Specimen: Nasopharyngeal Swab; Nasopharyngeal(NP) swabs in vial transport medium  Result Value Ref Range Status   SARS Coronavirus 2 by RT PCR POSITIVE (A) NEGATIVE Final    Comment: Ronn Melena, RN AT 2105 08/06/21 BY [facility] (NOTE) SARS-CoV-2 target nucleic acids are DETECTED.  The SARS-CoV-2 RNA is generally detectable in upper respiratory specimens during the acute phase of infection. Positive results are indicative of the presence of the identified virus, but do not rule out bacterial infection or co-infection with other pathogens not detected by the test. Clinical correlation with  patient history and other diagnostic information is necessary to determine patient infection status. The expected result is Negative.  Fact Sheet for Patients: EntrepreneurPulse.com.au  Fact Sheet for Healthcare Providers: IncredibleEmployment.be  This test is not yet approved or cleared by the [place] FDA and  has been authorized for detection and/or diagnosis of SARS-CoV-2 by FDA under an Emergency Use Authorization (EUA).  This EUA will remain in effect (meaning this test can be used) for the duration of  the COVID-19  declaration under Section 564(b)(1) of the Act, 21 U.S.C. section 360bbb-3(b)(1), unless the authorization is terminated or revoked sooner.     Influenza A by PCR NEGATIVE NEGATIVE Final   Influenza B by PCR NEGATIVE NEGATIVE Final    Comment: (NOTE) The Xpert Xpress SARS-CoV-2/FLU/RSV plus assay is intended as an aid in the diagnosis of influenza from Nasopharyngeal swab specimens and should not be used as a sole basis for treatment. Nasal washings and aspirates are unacceptable for Xpert Xpress SARS-CoV-2/FLU/RSV testing.  Fact Sheet for Patients: EntrepreneurPulse.com.au  Fact Sheet for Healthcare  Providers: IncredibleEmployment.be  This test is not yet approved or cleared by the [place] FDA and has been authorized for detection and/or diagnosis of SARS-CoV-2 by FDA under an Emergency Use Authorization (EUA). This EUA will remain in effect (meaning this test can be used) for the duration of the COVID-19 declaration under Section 564(b)(1) of the Act, 21 U.S.C. section 360bbb-3(b)(1), unless the authorization is terminated or revoked.  Performed at [facility], [address] [place], [place] [address]      Time coordinating discharge: Over 30 minutes  SIGNED:   Ezekiel Slocumb, DO [facility] 08/08/2021, 11:29 AM   If 7PM-7AM, please contact night-coverage [email or web address]

## 2021-08-08 NOTE — Plan of Care (Signed)

## 2021-08-08 NOTE — Plan of Care (Signed)
  Problem: Education: Goal: Knowledge of General Education information will improve Description: Including pain rating scale, medication(s)/side effects and non-pharmacologic comfort measures 08/08/2021 1221 by Orvan Seen, RN Outcome: Completed/Met 08/08/2021 1221 by Orvan Seen, RN Outcome: Progressing   Problem: Health Behavior/Discharge Planning: Goal: Ability to manage health-related needs will improve 08/08/2021 1221 by Orvan Seen, RN Outcome: Completed/Met 08/08/2021 1221 by Orvan Seen, RN Outcome: Progressing   Problem: Clinical Measurements: Goal: Ability to maintain clinical measurements within normal limits will improve 08/08/2021 1221 by Orvan Seen, RN Outcome: Completed/Met 08/08/2021 1221 by Orvan Seen, RN Outcome: Progressing Goal: Will remain free from infection 08/08/2021 1221 by Orvan Seen, RN Outcome: Completed/Met 08/08/2021 1221 by Orvan Seen, RN Outcome: Progressing Goal: Diagnostic test results will improve 08/08/2021 1221 by Orvan Seen, RN Outcome: Completed/Met 08/08/2021 1221 by Orvan Seen, RN Outcome: Progressing Goal: Respiratory complications will improve 08/08/2021 1221 by Orvan Seen, RN Outcome: Completed/Met 08/08/2021 1221 by Orvan Seen, RN Outcome: Progressing Goal: Cardiovascular complication will be avoided 08/08/2021 1221 by Orvan Seen, RN Outcome: Completed/Met 08/08/2021 1221 by Orvan Seen, RN Outcome: Progressing   Problem: Activity: Goal: Risk for activity intolerance will decrease 08/08/2021 1221 by Orvan Seen, RN Outcome: Completed/Met 08/08/2021 1221 by Orvan Seen, RN Outcome: Progressing   Problem: Nutrition: Goal: Adequate nutrition will be maintained 08/08/2021 1221 by Orvan Seen, RN Outcome: Completed/Met 08/08/2021 1221 by Orvan Seen, RN Outcome: Progressing   Problem: Coping: Goal: Level of anxiety will decrease 08/08/2021  1221 by Orvan Seen, RN Outcome: Completed/Met 08/08/2021 1221 by Orvan Seen, RN Outcome: Progressing   Problem: Elimination: Goal: Will not experience complications related to bowel motility 08/08/2021 1221 by Orvan Seen, RN Outcome: Completed/Met 08/08/2021 1221 by Orvan Seen, RN Outcome: Progressing Goal: Will not experience complications related to urinary retention 08/08/2021 1221 by Orvan Seen, RN Outcome: Completed/Met 08/08/2021 1221 by Orvan Seen, RN Outcome: Progressing   Problem: Pain Managment: Goal: General experience of comfort will improve 08/08/2021 1221 by Orvan Seen, RN Outcome: Completed/Met 08/08/2021 1221 by Orvan Seen, RN Outcome: Progressing   Problem: Safety: Goal: Ability to remain free from injury will improve 08/08/2021 1221 by Orvan Seen, RN Outcome: Completed/Met 08/08/2021 1221 by Orvan Seen, RN Outcome: Progressing   Problem: Skin Integrity: Goal: Risk for impaired skin integrity will decrease 08/08/2021 1221 by Orvan Seen, RN Outcome: Completed/Met 08/08/2021 1221 by Orvan Seen, RN Outcome: Progressing

## 2021-08-08 NOTE — Consult Note (Signed)
[facility] Cardiology  CARDIOLOGY CONSULT NOTE  Patient ID: Keith Williamson MRN: [id number] DOB/AGE: [birth year] 75 y.o.  Admit date: 08/06/2021 Referring Physician Nicole Kindred Primary Physician Carolyne Fiscal MD Primary Cardiologist Nehemiah Massed Reason for Consultation Syncope  HPI:  Keith Williamson is a 75 year old male with a history of CAD (inferior MI in 2014 status post DES to the RCA), hypothyroidism, DVT who is admitted following a "syncopal episode."  He is reportedly been sick recently with a cough and fever and was discovered to be COVID-positive.  Reportedly he has been sick for the last few days before his presentation with a cough and fever to 101 Fahrenheit. He says that he fell asleep on the couch and that his wife noticed he was breathing funny and his eyes rolled back in his head. She woke him up and he awoke immediately. He then was able to answer questions. She called EMS because she was concerned.   Work-up in the ED included a CTA PE protocol which was negative.  MRI brain relatively unremarkable aside from cerebral atrophy.  DVT noted within right posterior tibial vein.  He feels fine now and wants to go home. He denies any dizziness or presyncope.   Review of systems complete and found to be negative unless listed above     Past Medical History:  Diagnosis Date   Arthritis    BPH (benign prostatic hypertrophy)    CAD (coronary artery disease)    Diverticulosis    Enlarged prostate    GERD (gastroesophageal reflux disease)    Headache(784.0)    frequent sinus headache   Hurthle cell tumor    Hyperlipidemia    Hypertension    Hypothyroidism    MI (myocardial infarction) ([place]) 2014   inferior; s/p PCI with stent placement to RCA   OA (osteoarthritis)    Retina hole    Senile nuclear sclerosis    Thyroid cancer ([place])    iodine tx   Valvular heart disease     Past Surgical History:  Procedure Laterality Date   ANGIOPLASTY / STENTING FEMORAL     BACK  SURGERY     COLONOSCOPY WITH PROPOFOL N/A 05/07/2016   Procedure: COLONOSCOPY WITH PROPOFOL;  Surgeon: Lollie Sails, MD;  Location: [facility] ENDOSCOPY;  Service: Endoscopy;  Laterality: N/A;   ESOPHAGOGASTRODUODENOSCOPY (EGD) WITH PROPOFOL N/A 05/07/2016   Procedure: ESOPHAGOGASTRODUODENOSCOPY (EGD) WITH PROPOFOL;  Surgeon: Lollie Sails, MD;  Location: [facility] ENDOSCOPY;  Service: Endoscopy;  Laterality: N/A;   GREEN LIGHT LASER TURP (TRANSURETHRAL RESECTION OF PROSTATE N/A 01/01/2021   Procedure: GREEN LIGHT LASER TURP (TRANSURETHRAL RESECTION OF PROSTATE;  Surgeon: Royston Cowper, MD;  Location: [facility] ORS;  Service: Urology;  Laterality: N/A;   KNEE ARTHROSCOPY Left    Medial and lateral meniscectomy, partial   LUMBAR LAMINECTOMY  08/16/2012   L 3 4 & 5   LUMBAR MICRODISCECTOMY     L3-L4; with partial hemilaminectomy, partial facetectomy and foraminotomy   TOTAL THYROIDECTOMY     TURP VAPORIZATION      Medications Prior to Admission  Medication Sig Dispense Refill Last Dose   allopurinol (ZYLOPRIM) 100 MG tablet Take 100 mg by mouth daily.   08/06/2021   candesartan (ATACAND) 32 MG tablet Take 32 mg by mouth daily.   08/06/2021   carvedilol (COREG) 3.125 MG tablet Take 3.125 mg by mouth 2 (two) times daily with a meal.   08/06/2021   docusate sodium (COLACE) 100 MG capsule Take 2 capsules (200 mg total) by  mouth 2 (two) times daily. 120 capsule 3 prn at prn   dutasteride (AVODART) 0.5 MG capsule Take 0.5 mg by mouth daily.   08/06/2021   fluticasone (FLONASE) 50 MCG/ACT nasal spray Place 2 sprays into both nostrils daily.   08/06/2021   levothyroxine (SYNTHROID, LEVOTHROID) 137 MCG tablet Take 137 mcg by mouth daily before breakfast.   08/06/2021   Multiple Vitamins-Minerals (MULTIVITAMIN WITH MINERALS) tablet Take 1 tablet by mouth daily.   08/06/2021   pregabalin (LYRICA) 50 MG capsule Take 50 mg by mouth 2 (two) times daily.   08/06/2021   rosuvastatin (CRESTOR) 40 MG tablet Take 40 mg by  mouth daily.   08/06/2021   aspirin 81 MG tablet Take 81 mg by mouth daily. (Patient not taking: No sig reported)   Not Taking   gabapentin (NEURONTIN) 300 MG capsule Take 300 mg by mouth 2 (two) times daily. (Patient not taking: Reported on 08/06/2021)   Not Taking   nitroGLYCERIN (NITROSTAT) 0.4 MG SL tablet Place 0.4 mg under the tongue every 5 (five) minutes as needed for chest pain.      Social History   Socioeconomic History   Marital status: Married    Spouse name: Not on file   Number of children: Not on file   Years of education: Not on file   Highest education level: Not on file  Occupational History   Not on file  Tobacco Use   Smoking status: Former    Types: Cigarettes    Quit date: 12/17/1981    Years since quitting: 39.6   Smokeless tobacco: Never  Substance and Sexual Activity   Alcohol use: No   Drug use: No   Sexual activity: Not Currently  Other Topics Concern   Not on file  Social History Narrative   Not on file   Social Determinants of Health   Financial Resource Strain: Not on file  Food Insecurity: Not on file  Transportation Needs: Not on file  Physical Activity: Not on file  Stress: Not on file  Social Connections: Not on file  Intimate Partner Violence: Not on file    History reviewed. No pertinent family history.    Review of systems complete and found to be negative unless listed above      PHYSICAL EXAM Vitals:   08/08/21 0521 08/08/21 0805  BP: 132/90 (!) 153/92  Pulse: (!) 59 62  Resp: 17 16  Temp: 97.6 F (36.4 C) 97.8 F (36.6 C)  SpO2: 98% 99%     General: Well developed, well nourished, in no acute distress HEENT:  Normocephalic and atramatic Neck:  No JVD.  Lungs: Clear bilaterally to auscultation and percussion. Heart: HRRR . Normal S1 and S2 without gallops or murmurs.  Abdomen: Bowel sounds are positive, abdomen soft and non-tender  Msk:  Back normal, normal gait. Normal strength and tone for age. Extremities: No  clubbing, cyanosis or edema.   Neuro: Alert and oriented X 3. Psych:  Good affect, responds appropriately  Labs:   Lab Results  Component Value Date   WBC 3.3 (L) 08/08/2021   HGB 11.2 (L) 08/08/2021   HCT 34.7 (L) 08/08/2021   MCV 81.6 08/08/2021   PLT 188 08/08/2021    Recent Labs  Lab 08/08/21 0531  NA 137  K 3.2*  CL 103  CO2 28  BUN 17  CREATININE 0.91  CALCIUM 8.5*  PROT 6.2*  BILITOT 0.7  ALKPHOS 103  ALT 25  AST 26  GLUCOSE  89   Lab Results  Component Value Date   CKTOTAL 238 (H) 06/25/2013   CKMB 2.1 06/28/2013   TROPONINI 3.57 (H) 06/25/2013    Lab Results  Component Value Date   CHOL 205 (H) 06/25/2013   Lab Results  Component Value Date   HDL 40 06/25/2013   Lab Results  Component Value Date   LDLCALC 126 (H) 06/25/2013   Lab Results  Component Value Date   TRIG 197 06/25/2013   No results found for: CHOLHDL No results found for: LDLDIRECT    Radiology: DG Chest 2 View  Result Date: 08/06/2021 CLINICAL DATA:  Weakness, syncope EXAM: CHEST - 2 VIEW COMPARISON:  05/31/2021 FINDINGS: Cardiomegaly. Bibasilar atelectasis. No visible effusions or acute bony abnormality. Aortic atherosclerosis. IMPRESSION: Bibasilar atelectasis.  Mild cardiomegaly. Electronically Signed   By: Rolm Baptise M.D.   On: 08/06/2021 19:27   CT Head Wo Contrast  Result Date: 08/06/2021 CLINICAL DATA:  Mental status change, unknown cause Syncopal episode today. EXAM: CT HEAD WITHOUT CONTRAST TECHNIQUE: Contiguous axial images were obtained from the base of the skull through the vertex without intravenous contrast. COMPARISON:  None. FINDINGS: Brain: No intracranial hemorrhage, mass effect, or midline shift. Brain volume is normal for age. No hydrocephalus. The basilar cisterns are patent. There is mild periventricular and deep white matter hypodensity typical of chronic small vessel ischemia. Remote lacunar infarct in the right caudate. No evidence of territorial infarct or  acute ischemia. No extra-axial or intracranial fluid collection. Vascular: No hyperdense vessel. Skull: No fracture or focal lesion. Sinuses/Orbits: Paranasal sinuses and mastoid air cells are clear. The visualized orbits are unremarkable. Other: None. IMPRESSION: 1. No acute intracranial abnormality. 2. Mild chronic small vessel ischemia. Remote lacunar infarct in the right caudate. Electronically Signed   By: Keith Rake M.D.   On: 08/06/2021 19:29   CT Angio Chest PE W/Cm &/Or Wo Cm  Result Date: 08/06/2021 CLINICAL DATA:  PE suspected, low/intermediate prob, positive D-dimer. Syncope, hypotension. EXAM: CT ANGIOGRAPHY CHEST WITH CONTRAST TECHNIQUE: Multidetector CT imaging of the chest was performed using the standard protocol during bolus administration of intravenous contrast. Multiplanar CT image reconstructions and MIPs were obtained to evaluate the vascular anatomy. CONTRAST:  75m OMNIPAQUE IOHEXOL 350 MG/ML SOLN COMPARISON:  None. FINDINGS: Cardiovascular: There is adequate opacification of the pulmonary arterial tree. No intraluminal filling defect identified to suggest acute pulmonary embolism. The central pulmonary arteries are of normal caliber. Extensive multi-vessel coronary artery calcification. Mild global cardiomegaly. No pericardial effusion. Mild atherosclerotic calcification within the thoracic aorta. No aortic aneurysm. Mediastinum/Nodes: No enlarged mediastinal, hilar, or axillary lymph nodes. Thyroid gland, trachea, and esophagus demonstrate no significant findings. Lungs/Pleura: Lungs are clear. No pleural effusion or pneumothorax. Upper Abdomen: No acute abnormality. Musculoskeletal: No acute bone abnormality. Osseous structures are age-appropriate. Review of the MIP images confirms the above findings. IMPRESSION: No pulmonary embolism. Extensive multi-vessel coronary artery calcification. Mild global cardiomegaly. Aortic Atherosclerosis (ICD10-I70.0). Electronically Signed   By:  AFidela SalisburyM.D.   On: 08/06/2021 21:16   MR BRAIN WO CONTRAST  Result Date: 08/07/2021 CLINICAL DATA:  Initial evaluation for acute syncope. COVID positive. EXAM: MRI HEAD WITHOUT CONTRAST TECHNIQUE: Multiplanar, multiecho pulse sequences of the brain and surrounding structures were obtained without intravenous contrast. COMPARISON:  Prior CT from 08/06/2021. FINDINGS: Brain: Examination mildly degraded by motion artifact. Generalized age-related cerebral atrophy. Patchy T2/FLAIR hyperintensity within the periventricular and deep white matter both cerebral hemispheres most consistent chronic small vessel ischemic disease,  mild in nature. Remote lacunar infarct present at the right caudate. No abnormal foci of restricted diffusion to suggest acute or subacute ischemia. Gray-white matter differentiation maintained. No encephalomalacia to suggest chronic cortical infarction. No acute intracranial hemorrhage. Few small chronic micro hemorrhages noted involving the cerebellum and right cerebral hemisphere, likely small vessel/hypertensive in nature. No mass lesion, midline shift or mass effect. No hydrocephalus or extra-axial fluid collection. Pituitary gland suprasellar region within normal limits. Midline structures intact. Vascular: Major intracranial vascular flow voids are maintained. Skull and upper cervical spine: Craniocervical junction within normal limits. Bone marrow signal intensity within normal limits. Small focus of susceptibility artifact noted at the left frontal scalp, corresponding with small calcification and/or foreign body seen on corresponding CT. Scalp soft tissues demonstrate no acute finding. Sinuses/Orbits: Globes and orbital soft tissues demonstrate no acute finding. Mild scattered mucosal thickening noted within the ethmoidal air cells. Paranasal sinuses are otherwise clear. No mastoid effusion. Inner ear structures grossly normal. Other: None. IMPRESSION: 1. No acute intracranial  abnormality. 2. Age-related cerebral atrophy with mild chronic small vessel ischemic disease, with small remote lacunar infarct at the right caudate. Electronically Signed   By: Jeannine Boga M.D.   On: 08/07/2021 01:48   MR CERVICAL SPINE WO CONTRAST  Result Date: 08/03/2021 CLINICAL DATA:  Paresthesia. Neck and bilateral arm pain. Numbness in the fingers of both hands. EXAM: MRI CERVICAL SPINE WITHOUT CONTRAST TECHNIQUE: Multiplanar, multisequence MR imaging of the cervical spine was performed. No intravenous contrast was administered. COMPARISON:  None. FINDINGS: Alignment: Minimal retrolisthesis of C3 on C4. Minimal anterolisthesis of C7 on T1 and T1 on T2. Vertebrae: No fracture or suspicious marrow lesion. Mild right-sided facet edema at C7-T1 which appears degenerative. Cord: Abnormal T2 hyperintensity in the spinal cord bilaterally at C3-4 associated with spondylosis. Posterior Fossa, vertebral arteries, paraspinal tissues: Unremarkable. Disc levels: C2-3: Moderate to severe right and mild left facet arthrosis without stenosis. C3-4: Retrolisthesis with bulging uncovered disc, a central disc protrusion, uncovertebral spurring, infolding of the ligamentum flavum, and severe facet arthrosis result in severe spinal stenosis with moderate cord flattening and moderate to severe right greater than left neural foraminal stenosis. C4-5: Disc bulging, a small right central disc protrusion, uncovertebral spurring, infolding of the ligamentum flavum, and severe facet arthrosis result in moderate spinal stenosis and severe bilateral neural foraminal stenosis. C5-6: Disc bulging, uncovertebral spurring, and severe facet arthrosis result in mild spinal stenosis and moderate to severe right and severe left neural foraminal stenosis. C6-7: Disc bulging, uncovertebral spurring, and severe facet arthrosis result in mild spinal stenosis and moderate to severe bilateral neural foraminal stenosis. C7-T1:  Anterolisthesis with disc uncovering, infolding of the ligamentum flavum, and severe facet arthrosis result in mild spinal stenosis and severe bilateral neural foraminal stenosis. T1-2: Only imaged sagittally. Anterolisthesis with disc uncovering and severe facet arthrosis result in mild bilateral neural foraminal stenosis without spinal stenosis. T2-3: Only imaged sagittally. A small left foraminal disc protrusion with annular fissure and severe facet arthrosis result in moderate left neural foraminal stenosis. No spinal stenosis. IMPRESSION: 1. Diffuse cervical disc and facet degeneration, most notable at C3-4 where there is severe spinal stenosis and spinal cord signal abnormality compatible with edema or myelomalacia. 2. Moderate spinal stenosis at C4-5. 3. Severe multilevel neural foraminal stenosis as above. Electronically Signed   By: Logan Bores M.D.   On: 08/03/2021 14:48   US Venous Img Lower Bilateral (DVT)  Result Date: 08/07/2021 CLINICAL DATA:  Bilateral leg pain and  swelling, COVID-19 positivity EXAM: BILATERAL LOWER EXTREMITY VENOUS DOPPLER ULTRASOUND TECHNIQUE: Gray-scale sonography with graded compression, as well as color Doppler and duplex ultrasound were performed to evaluate the lower extremity deep venous systems from the level of the common femoral vein and including the common femoral, femoral, profunda femoral, popliteal and calf veins including the posterior tibial, peroneal and gastrocnemius veins when visible. The superficial great saphenous vein was also interrogated. Spectral Doppler was utilized to evaluate flow at rest and with distal augmentation maneuvers in the common femoral, femoral and popliteal veins. COMPARISON:  None. FINDINGS: RIGHT LOWER EXTREMITY Common Femoral Vein: No evidence of thrombus. Normal compressibility, respiratory phasicity and response to augmentation. Saphenofemoral Junction: No evidence of thrombus. Normal compressibility and flow on color Doppler  imaging. Profunda Femoral Vein: No evidence of thrombus. Normal compressibility and flow on color Doppler imaging. Femoral Vein: No evidence of thrombus. Normal compressibility, respiratory phasicity and response to augmentation. Popliteal Vein: No evidence of thrombus. Normal compressibility, respiratory phasicity and response to augmentation. Calf Veins: Thrombus is noted within the posterior tibial vein with decreased compressibility. Superficial Great Saphenous Vein: No evidence of thrombus. Normal compressibility. Venous Reflux:  None. Other Findings:  None. LEFT LOWER EXTREMITY Common Femoral Vein: No evidence of thrombus. Normal compressibility, respiratory phasicity and response to augmentation. Saphenofemoral Junction: No evidence of thrombus. Normal compressibility and flow on color Doppler imaging. Profunda Femoral Vein: No evidence of thrombus. Normal compressibility and flow on color Doppler imaging. Femoral Vein: No evidence of thrombus. Normal compressibility, respiratory phasicity and response to augmentation. Popliteal Vein: No evidence of thrombus. Normal compressibility, respiratory phasicity and response to augmentation. Calf Veins: No evidence of thrombus. Normal compressibility and flow on color Doppler imaging. Superficial Great Saphenous Vein: No evidence of thrombus. Normal compressibility. Venous Reflux:  None. Other Findings:  None. IMPRESSION: Deep venous thrombosis within the right posterior tibial vein. No other focal abnormality is noted. Electronically Signed   By: Inez Catalina M.D.   On: 08/07/2021 03:12   DG Shoulder Right Port  Result Date: 08/07/2021 CLINICAL DATA:  Right shoulder pain EXAM: PORTABLE RIGHT SHOULDER COMPARISON:  None. FINDINGS: No acute fracture or dislocation. Moderate degenerative changes of the right shoulder joint and acromioclavicular joint. Soft tissues are unremarkable. IMPRESSION: Moderate degenerative changes of the right shoulder joint and  acromioclavicular joint. Narrowing of the acromial humeral interval which can be seen in the setting of chronic rotator cuff pathology. Electronically Signed   By: Yetta Glassman M.D.   On: 08/07/2021 14:05    EKG: Sinus brady with 1st degree AV block. Left axis.   Echo:  1. Left ventricular ejection fraction, by estimation, is 60 to 65%. The  left ventricle has normal function. The left ventricle has no regional  wall motion abnormalities. There is moderate left ventricular hypertrophy.  Left ventricular diastolic  parameters are consistent with Grade I diastolic dysfunction (impaired  relaxation).   2. Right ventricular systolic function is normal. The right ventricular  size is normal. There is normal pulmonary artery systolic pressure. The  estimated right ventricular systolic pressure is [id number] mmHg.   3. The mitral valve is normal in structure. Mild mitral valve  regurgitation. No evidence of mitral stenosis.   ASSESSMENT AND PLAN:  # Concern for syncope # Cough, COVID 19 infection He describes falling asleep and his wife being concerned about his breathing and his eyes; this is not a syncopal episode and he woke up immediately per his report.  EKG reveals first-degree AV  block, and poor R wave progression.  Telemetry shows no evidence of high grade AV block.  Recent echo without evidence of significant aortic stenosis as a cause for syncope. - No further workup needed.  -Maintain on telemetry during admission -Hold beta-blocker out of abundance of caution.  - Follow up with cardiologist as scheduled.   #CAD status post PCI in 2014 to the RCA -No active issues currently -Sinew aspirin and Crestor. -Hold beta-blocker as above   Signed: Andrez Grime MD 08/08/2021, 8:42 AM
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
## 2021-08-08 NOTE — Progress Notes (Addendum)
Patient discharged to home wheeled out of unit by NT with all belongings. A+Ox4.  Hard of hearing; hearing aids were sent home with family last night.  Hypertensive, re-started on BP meds recently, otherwise VSS.  Discharge instructions and medications reviewed with patient.  All questions answered. Patient agreed to follow up with Cardiologist and with Orthopedic Clinic as instructed on AVS.  PIV removed, no bleeding, intact.  Patient verbalized understanding of signs and symptoms of infection.  Patient satisfied with overall care at [facility].

## 2021-08-08 NOTE — Evaluation (Signed)
Physical Therapy Evaluation Patient Details Name: Keith Williamson MRN: [id number] DOB: 01/18/1946 Today's Date: 08/08/2021   History of Present Illness  Pt is a 75 y/o M who presented to the ED with c/c of syncope. Pt found to be covid (+). PMH: CAD s/p PCI in 2014, hypothyroid, thyroidectomy in 2007, thyroid CA, DVT  Clinical Impression  Pt seen for PT evaluation with pt reporting he was working with OPPT services following NWB RLE x 12 weeks (ended mid July) prior to admission. On this date pt is able to complete bed mobility & sit<>stand transfers with Mod I. Pt ambulates in room with RW & supervision with decreased gait speed. Pt positive for orthostatic hypotension during sit>stand but denies symptoms (pt with elevated supine & sitting BP & MD notified & cleared pt for gait). Anticipate pt can return home & resume OPPT services. Will continue to follow pt acutely to maintain & progress endurance & balance.   BP checked in RUE: Supine: 181/101 mmHg (MAP 129) Sitting: 185/110 mmHg (MAP 133) Standing at 0: 156/104 mmHg (MAP 121)     Follow Up Recommendations Supervision - Intermittent (can resume OPPT services)    Equipment Recommendations  None recommended by PT    Recommendations for Other Services       Precautions / Restrictions Precautions Precautions: Fall Restrictions Weight Bearing Restrictions: Yes RLE Weight Bearing: Weight bearing as tolerated (per pt)      Mobility  Bed Mobility Overal bed mobility: Modified Independent             General bed mobility comments: supine>sit with HOB elevated, bed rails    Transfers Overall transfer level: Modified independent Equipment used: Rolling walker (2 wheeled)             General transfer comment: sit<>stand with RW & mod I  Ambulation/Gait Ambulation/Gait assistance: Supervision Gait Distance (Feet): 70 Feet (multiple laps in room equating to ~70 ft) Assistive device: Rolling walker (2 wheeled) Gait  Pattern/deviations: Decreased stride length Gait velocity: decreased   General Gait Details: forward trunk lean on RW (PT adjusted height of AD)  Stairs            Wheelchair Mobility    Modified Rankin (Stroke Patients Only)       Balance Overall balance assessment: Needs assistance Sitting-balance support: No upper extremity supported Sitting balance-Leahy Scale: Normal       Standing balance-Leahy Scale: Good                               Pertinent Vitals/Pain Pain Assessment: 0-10 Pain Score: 8  Pain Location: R hip & back Pain Descriptors / Indicators: Aching;Discomfort Pain Intervention(s): Limited activity within patient's tolerance;Premedicated before session    Home Living Family/patient expects to be discharged to:: Private residence Living Arrangements: Spouse/significant other Available Help at Discharge: Family;Available 24 hours/day Type of Home: House Home Access: Stairs to enter Entrance Stairs-Rails: Right;Left;Can reach both (handrails/grab bars) Entrance Stairs-Number of Steps: 5 Home Layout: Two level Home Equipment: Walker - 2 wheels;Bedside commode;Wheelchair - manual;Crutches      Prior Function           Comments: Pt was NWB RLE x 12 weeks until mid July. Pt reports he's now WBAT & was working with OPPT.     Hand Dominance        Extremity/Trunk Assessment   Upper Extremity Assessment Upper Extremity Assessment: Overall WFL for tasks assessed  Lower Extremity Assessment Lower Extremity Assessment: Generalized weakness       Communication   Communication: HOH  Cognition Arousal/Alertness: Awake/alert Behavior During Therapy: WFL for tasks assessed/performed Overall Cognitive Status: Within Functional Limits for tasks assessed                                        General Comments      Exercises     Assessment/Plan    PT Assessment Patient needs continued PT services  PT  Problem List Decreased strength;Decreased mobility;Decreased balance       PT Treatment Interventions DME instruction;Therapeutic activities;Modalities;Gait training;Therapeutic exercise;Patient/family education;Stair training;Balance training;Functional mobility training;Neuromuscular re-education;Manual techniques    PT Goals (Current goals can be found in the Care Plan section)  Acute Rehab PT Goals Patient Stated Goal: go home PT Goal Formulation: With patient Time For Goal Achievement: 08/22/21 Potential to Achieve Goals: Good    Frequency Min 2X/week   Barriers to discharge        Co-evaluation               AM-PAC PT "6 Clicks" Mobility  Outcome Measure Help needed turning from your back to your side while in a flat bed without using bedrails?: A Little Help needed moving from lying on your back to sitting on the side of a flat bed without using bedrails?: None Help needed moving to and from a bed to a chair (including a wheelchair)?: A Little Help needed standing up from a chair using your arms (e.g., wheelchair or bedside chair)?: A Little   Help needed climbing 3-5 steps with a railing? : A Little 6 Click Score: 16    End of Session   Activity Tolerance: Patient tolerated treatment well Patient left: in chair;with call bell/phone within reach (MD in room) Nurse Communication: Mobility status PT Visit Diagnosis: Muscle weakness (generalized) (M62.81)    Time: [id number] PT Time Calculation (min) (ACUTE ONLY): 26 min   Charges:   PT Evaluation $PT Eval Low Complexity: 1 Low PT Treatments $Therapeutic Activity: 8-22 mins        Lavone Nian, PT, DPT 08/08/21, 12:55 PM   [name] 08/08/2021, 12:53 PM

## 2021-08-14 DIAGNOSIS — R7309 Other abnormal glucose: Secondary | ICD-10-CM | POA: Diagnosis not present

## 2021-08-14 DIAGNOSIS — R55 Syncope and collapse: Secondary | ICD-10-CM | POA: Diagnosis not present

## 2021-08-14 DIAGNOSIS — Z955 Presence of coronary angioplasty implant and graft: Secondary | ICD-10-CM | POA: Diagnosis not present

## 2021-08-14 DIAGNOSIS — D649 Anemia, unspecified: Secondary | ICD-10-CM | POA: Diagnosis not present

## 2021-08-14 DIAGNOSIS — E782 Mixed hyperlipidemia: Secondary | ICD-10-CM | POA: Diagnosis not present

## 2021-08-14 DIAGNOSIS — M25551 Pain in right hip: Secondary | ICD-10-CM | POA: Diagnosis not present

## 2021-08-14 DIAGNOSIS — I10 Essential (primary) hypertension: Secondary | ICD-10-CM | POA: Diagnosis not present

## 2021-08-14 DIAGNOSIS — Z09 Encounter for follow-up examination after completed treatment for conditions other than malignant neoplasm: Secondary | ICD-10-CM | POA: Diagnosis not present

## 2021-08-14 DIAGNOSIS — J22 Unspecified acute lower respiratory infection: Secondary | ICD-10-CM | POA: Diagnosis not present

## 2021-08-14 DIAGNOSIS — E8809 Other disorders of plasma-protein metabolism, not elsewhere classified: Secondary | ICD-10-CM | POA: Diagnosis not present

## 2021-08-14 DIAGNOSIS — U071 COVID-19: Secondary | ICD-10-CM | POA: Diagnosis not present

## 2021-08-14 DIAGNOSIS — E876 Hypokalemia: Secondary | ICD-10-CM | POA: Diagnosis not present

## 2021-08-14 DIAGNOSIS — I82401 Acute embolism and thrombosis of unspecified deep veins of right lower extremity: Secondary | ICD-10-CM | POA: Diagnosis not present

## 2021-08-20 DIAGNOSIS — R2689 Other abnormalities of gait and mobility: Secondary | ICD-10-CM | POA: Diagnosis not present

## 2021-08-20 DIAGNOSIS — M25551 Pain in right hip: Secondary | ICD-10-CM | POA: Diagnosis not present

## 2021-08-20 DIAGNOSIS — S32461A Displaced associated transverse-posterior fracture of right acetabulum, initial encounter for closed fracture: Secondary | ICD-10-CM | POA: Diagnosis not present

## 2021-08-20 DIAGNOSIS — M25651 Stiffness of right hip, not elsewhere classified: Secondary | ICD-10-CM | POA: Diagnosis not present

## 2021-08-24 DIAGNOSIS — S32461A Displaced associated transverse-posterior fracture of right acetabulum, initial encounter for closed fracture: Secondary | ICD-10-CM | POA: Diagnosis not present

## 2021-08-24 DIAGNOSIS — M25551 Pain in right hip: Secondary | ICD-10-CM | POA: Diagnosis not present

## 2021-08-24 DIAGNOSIS — M25651 Stiffness of right hip, not elsewhere classified: Secondary | ICD-10-CM | POA: Diagnosis not present

## 2021-08-24 DIAGNOSIS — R2689 Other abnormalities of gait and mobility: Secondary | ICD-10-CM | POA: Diagnosis not present

## 2021-08-26 DIAGNOSIS — M5412 Radiculopathy, cervical region: Secondary | ICD-10-CM | POA: Diagnosis not present

## 2021-08-26 DIAGNOSIS — G629 Polyneuropathy, unspecified: Secondary | ICD-10-CM | POA: Diagnosis not present

## 2021-08-26 DIAGNOSIS — G959 Disease of spinal cord, unspecified: Secondary | ICD-10-CM | POA: Diagnosis not present

## 2021-08-27 DIAGNOSIS — R2689 Other abnormalities of gait and mobility: Secondary | ICD-10-CM | POA: Diagnosis not present

## 2021-08-27 DIAGNOSIS — S32461A Displaced associated transverse-posterior fracture of right acetabulum, initial encounter for closed fracture: Secondary | ICD-10-CM | POA: Diagnosis not present

## 2021-08-27 DIAGNOSIS — M25651 Stiffness of right hip, not elsewhere classified: Secondary | ICD-10-CM | POA: Diagnosis not present

## 2021-08-27 DIAGNOSIS — M25551 Pain in right hip: Secondary | ICD-10-CM | POA: Diagnosis not present

## 2021-08-28 ENCOUNTER — Encounter: Payer: Self-pay | Admitting: Radiology

## 2021-08-28 ENCOUNTER — Other Ambulatory Visit: Payer: Self-pay

## 2021-08-28 ENCOUNTER — Emergency Department
Admission: EM | Admit: 2021-08-28 | Discharge: 2021-08-28 | Disposition: A | Discharge status: Home / Self Care | Payer: PPO | Attending: Emergency Medicine | Admitting: Emergency Medicine

## 2021-08-28 ENCOUNTER — Emergency Department: Payer: PPO

## 2021-08-28 DIAGNOSIS — R079 Chest pain, unspecified: Secondary | ICD-10-CM | POA: Insufficient documentation

## 2021-08-28 DIAGNOSIS — I38 Endocarditis, valve unspecified: Secondary | ICD-10-CM | POA: Diagnosis not present

## 2021-08-28 DIAGNOSIS — I119 Hypertensive heart disease without heart failure: Secondary | ICD-10-CM | POA: Diagnosis not present

## 2021-08-28 DIAGNOSIS — I2510 Atherosclerotic heart disease of native coronary artery without angina pectoris: Secondary | ICD-10-CM | POA: Diagnosis not present

## 2021-08-28 DIAGNOSIS — E782 Mixed hyperlipidemia: Secondary | ICD-10-CM | POA: Diagnosis not present

## 2021-08-28 DIAGNOSIS — Z5321 Procedure and treatment not carried out due to patient leaving prior to being seen by health care provider: Secondary | ICD-10-CM | POA: Insufficient documentation

## 2021-08-28 DIAGNOSIS — G629 Polyneuropathy, unspecified: Secondary | ICD-10-CM | POA: Diagnosis not present

## 2021-08-28 DIAGNOSIS — Z955 Presence of coronary angioplasty implant and graft: Secondary | ICD-10-CM | POA: Diagnosis not present

## 2021-08-28 DIAGNOSIS — G959 Disease of spinal cord, unspecified: Secondary | ICD-10-CM | POA: Diagnosis not present

## 2021-08-28 DIAGNOSIS — M5412 Radiculopathy, cervical region: Secondary | ICD-10-CM | POA: Diagnosis not present

## 2021-08-28 DIAGNOSIS — R0602 Shortness of breath: Secondary | ICD-10-CM | POA: Insufficient documentation

## 2021-08-28 DIAGNOSIS — I10 Essential (primary) hypertension: Secondary | ICD-10-CM | POA: Diagnosis not present

## 2021-08-28 LAB — BASIC METABOLIC PANEL
Anion gap: 7 (ref 5–15)
BUN: 25 mg/dL — ABNORMAL HIGH (ref 8–23)
CO2: 30 mmol/L (ref 22–32)
Calcium: 9.3 mg/dL (ref 8.9–10.3)
Chloride: 103 mmol/L (ref 98–111)
Creatinine, Ser: 1.2 mg/dL (ref 0.61–1.24)
GFR, Estimated: >60 mL/min (ref >60)
Glucose, Bld: 90 mg/dL (ref 70–99)
Potassium: 3.4 mmol/L — ABNORMAL LOW (ref 3.5–5.1)
Sodium: 140 mmol/L (ref 135–145)

## 2021-08-28 LAB — PROTIME-INR
INR: 1.5 — ABNORMAL HIGH (ref 0.8–1.2)
Prothrombin Time: 18.5 seconds — ABNORMAL HIGH (ref 11.4–15.2)

## 2021-08-28 LAB — CBC
HCT: 34 % — ABNORMAL LOW (ref 39.0–52.0)
Hemoglobin: 11 g/dL — ABNORMAL LOW (ref 13.0–17.0)
MCH: 26.6 pg (ref 26.0–34.0)
MCHC: 32.4 g/dL (ref 30.0–36.0)
MCV: 82.1 fL (ref 80.0–100.0)
Platelets: 312 10*3/uL (ref 150–400)
RBC: 4.14 MIL/uL — ABNORMAL LOW (ref 4.22–5.81)
RDW: 18.6 % — ABNORMAL HIGH (ref 11.5–15.5)
WBC: 6.4 10*3/uL (ref 4.0–10.5)
nRBC: 0 % (ref 0.0–0.2)

## 2021-08-28 LAB — TROPONIN I (HIGH SENSITIVITY): Troponin I (High Sensitivity): 6 ng/L (ref <18)

## 2021-08-28 NOTE — ED Triage Notes (Signed)
Burning CP/shob this morning at cardiologist [facility] [place] with normal ekg, ?due to side effects of remdisivir he received last month), had episode last night x15 min given tums, had another episode 1800 which prompted ed visit.

## 2021-08-29 ENCOUNTER — Encounter: Payer: Self-pay | Admitting: Emergency Medicine

## 2021-08-29 ENCOUNTER — Emergency Department: Payer: PPO

## 2021-08-29 ENCOUNTER — Other Ambulatory Visit: Payer: Self-pay

## 2021-08-29 ENCOUNTER — Emergency Department
Admission: EM | Admit: 2021-08-29 | Discharge: 2021-08-29 | Disposition: A | Discharge status: Home / Self Care | Payer: PPO | Attending: Emergency Medicine | Admitting: Emergency Medicine

## 2021-08-29 DIAGNOSIS — Z79899 Other long term (current) drug therapy: Secondary | ICD-10-CM | POA: Diagnosis not present

## 2021-08-29 DIAGNOSIS — I10 Essential (primary) hypertension: Secondary | ICD-10-CM | POA: Insufficient documentation

## 2021-08-29 DIAGNOSIS — R1010 Upper abdominal pain, unspecified: Secondary | ICD-10-CM | POA: Insufficient documentation

## 2021-08-29 DIAGNOSIS — R109 Unspecified abdominal pain: Secondary | ICD-10-CM | POA: Diagnosis not present

## 2021-08-29 DIAGNOSIS — Z8616 Personal history of COVID-19: Secondary | ICD-10-CM | POA: Diagnosis not present

## 2021-08-29 DIAGNOSIS — Z7901 Long term (current) use of anticoagulants: Secondary | ICD-10-CM | POA: Insufficient documentation

## 2021-08-29 DIAGNOSIS — R1013 Epigastric pain: Secondary | ICD-10-CM | POA: Insufficient documentation

## 2021-08-29 DIAGNOSIS — Z85850 Personal history of malignant neoplasm of thyroid: Secondary | ICD-10-CM | POA: Insufficient documentation

## 2021-08-29 DIAGNOSIS — I2510 Atherosclerotic heart disease of native coronary artery without angina pectoris: Secondary | ICD-10-CM | POA: Diagnosis not present

## 2021-08-29 DIAGNOSIS — Z7982 Long term (current) use of aspirin: Secondary | ICD-10-CM | POA: Insufficient documentation

## 2021-08-29 DIAGNOSIS — Z87891 Personal history of nicotine dependence: Secondary | ICD-10-CM | POA: Diagnosis not present

## 2021-08-29 DIAGNOSIS — E039 Hypothyroidism, unspecified: Secondary | ICD-10-CM | POA: Insufficient documentation

## 2021-08-29 LAB — HEPATIC FUNCTION PANEL
ALT: 21 U/L (ref 0–44)
AST: 24 U/L (ref 15–41)
Albumin: 3.2 g/dL — ABNORMAL LOW (ref 3.5–5.0)
Alkaline Phosphatase: 116 U/L (ref 38–126)
Bilirubin, Direct: <0.1 mg/dL (ref 0.0–0.2)
Total Bilirubin: 0.9 mg/dL (ref 0.3–1.2)
Total Protein: 6.7 g/dL (ref 6.5–8.1)

## 2021-08-29 LAB — LIPASE, BLOOD: Lipase: 39 U/L (ref 11–51)

## 2021-08-29 LAB — TROPONIN I (HIGH SENSITIVITY): Troponin I (High Sensitivity): 8 ng/L (ref <18)

## 2021-08-29 MED ORDER — ALUM & MAG HYDROXIDE-SIMETH 200-200-20 MG/5ML PO SUSP
30.0000 mL | Freq: Once | ORAL | Status: AC
  Administered 2021-08-29: 30 mL via ORAL
  Filled 2021-08-29: qty 30

## 2021-08-29 MED ORDER — LIDOCAINE VISCOUS HCL 2 % MT SOLN: 30.0000 mL | Freq: Two times a day (BID) | OROMUCOSAL | 0 refills | Status: DC | PRN

## 2021-08-29 MED ORDER — PANTOPRAZOLE SODIUM 40 MG PO TBEC: 40.0000 mg | DELAYED_RELEASE_TABLET | Freq: Every day | ORAL | 1 refills | Status: DC

## 2021-08-29 MED ORDER — LIDOCAINE VISCOUS HCL 2 % MT SOLN
15.0000 mL | Freq: Once | OROMUCOSAL | Status: AC
  Administered 2021-08-29: 15 mL via ORAL
  Filled 2021-08-29: qty 15

## 2021-08-29 NOTE — ED Notes (Signed)
Pt signed paper DC form and copy made at his request and provided to pt.

## 2021-08-29 NOTE — ED Notes (Addendum)
This RN to bedside to answer call bell. Pt is stating " I am choking" as he is trying to cough up something. Pt is pink warm and dry. Oxygen sat at 100% on room air and he is speaking in complete sentences. Pt was given a GI cocktail with lidocaine. RN attempted to educate pt on the numbness feeling he might be having in his throat due to the medication. Pt sitting up on edge of bed. Given small glass of water to show him he was not choking. Wife advised that he does not want anymore of that medication. He was able to drink entire cup of water.

## 2021-08-29 NOTE — ED Triage Notes (Signed)
Pt to ED c/o CP and SOB yesterday and seen by cardiologist.  Came to ED but LWBS after triage but returned this morning for results.  Per Dr. Archie Balboa only need troponin at this time.  Pt A&Ox4, chest rise even and unlabored, in NAD at this time.

## 2021-08-29 NOTE — ED Notes (Signed)
Pt states he is recovering from covid, has been taking remdesivir and he thinks it's causing GI upset. Per spouse, pt has net been able to eat normally, gets burning sensation after meals. Pt is AOX4, NAD noted, pt hard of hearing. Respirations even and unlabored, lung sounds clear bilaterally. Pt c/o continued SOB and chest pain at this time.

## 2021-08-29 NOTE — ED Provider Notes (Signed)
[facility] Emergency Department Provider Note  Time seen: 12:11 PM  I have reviewed the triage vital signs and the nursing notes.   HISTORY  Chief Complaint Chest Pain and Shortness of Breath   HPI Keith Williamson is a 75 y.o. male with a past medical history of arthritis, CAD, gastric reflux, hypertension, hyperlipidemia, presents to the emergency department for upper abdominal pain.  According to the patient for the past 2 to 3 days he has been experiencing upper abdominal/epigastric discomfort.  Patient states he has tried Tums which helped somewhat but then the pain comes back.  Patient denies any pain in his chest but he does state that the abdominal pain does make him feel short of breath at times.  States the pain is intermittent denies any currently.  States nausea at times as well.  Denies any fever.   Past Medical History:  Diagnosis Date   Arthritis    BPH (benign prostatic hypertrophy)    CAD (coronary artery disease)    Diverticulosis    Enlarged prostate    GERD (gastroesophageal reflux disease)    Headache(784.0)    frequent sinus headache   Hurthle cell tumor    Hyperlipidemia    Hypertension    Hypothyroidism    MI (myocardial infarction) ([place]) 2014   inferior; s/p PCI with stent placement to RCA   OA (osteoarthritis)    Retina hole    Senile nuclear sclerosis    Thyroid cancer ([place])    iodine tx   Valvular heart disease     Patient Active Problem List   Diagnosis Date Noted   Essential hypertension 08/06/2021   Hyperlipidemia 08/06/2021   Hypothyroid 08/06/2021   COVID-19 virus infection 08/06/2021   Syncope 05/31/2021   Right leg DVT ([place]) 05/31/2021   Spinal stenosis, lumbar 08/07/2012    Past Surgical History:  Procedure Laterality Date   ANGIOPLASTY / STENTING FEMORAL     BACK SURGERY     COLONOSCOPY WITH PROPOFOL N/A 05/07/2016   Procedure: COLONOSCOPY WITH PROPOFOL;  Surgeon: Lollie Sails, MD;  Location: [facility]  ENDOSCOPY;  Service: Endoscopy;  Laterality: N/A;   ESOPHAGOGASTRODUODENOSCOPY (EGD) WITH PROPOFOL N/A 05/07/2016   Procedure: ESOPHAGOGASTRODUODENOSCOPY (EGD) WITH PROPOFOL;  Surgeon: Lollie Sails, MD;  Location: [facility] ENDOSCOPY;  Service: Endoscopy;  Laterality: N/A;   GREEN LIGHT LASER TURP (TRANSURETHRAL RESECTION OF PROSTATE N/A 01/01/2021   Procedure: GREEN LIGHT LASER TURP (TRANSURETHRAL RESECTION OF PROSTATE;  Surgeon: Royston Cowper, MD;  Location: [facility] ORS;  Service: Urology;  Laterality: N/A;   KNEE ARTHROSCOPY Left    Medial and lateral meniscectomy, partial   LUMBAR LAMINECTOMY  08/16/2012   L 3 4 & 5   LUMBAR MICRODISCECTOMY     L3-L4; with partial hemilaminectomy, partial facetectomy and foraminotomy   TOTAL THYROIDECTOMY     TURP VAPORIZATION      Prior to Admission medications   Medication Sig Start Date End Date Taking? Authorizing Provider  allopurinol (ZYLOPRIM) 100 MG tablet Take 100 mg by mouth daily.    [provider]  apixaban (ELIQUIS) 5 MG TABS tablet Take 2 tablets (10 mg total) by mouth 2 (two) times daily for 5 days, THEN 1 tablet (5 mg total) 2 (two) times daily. 08/08/21 09/12/21  Nicole Kindred A, DO  aspirin 81 MG tablet Take 81 mg by mouth daily. Patient not taking: No sig reported    [provider]  BACLOFEN PO Take by mouth.    [provider]  candesartan (ATACAND) 32 MG tablet Take 32 mg by mouth daily.    [provider]  docusate sodium (COLACE) 100 MG capsule Take 2 capsules (200 mg total) by mouth 2 (two) times daily. 01/01/21   Royston Cowper, MD  dutasteride (AVODART) 0.5 MG capsule Take 0.5 mg by mouth daily.    [provider]  fluticasone (FLONASE) 50 MCG/ACT nasal spray Place 2 sprays into both nostrils daily.    [provider]  levothyroxine (SYNTHROID, LEVOTHROID) 137 MCG tablet Take 137 mcg by mouth daily before breakfast.    [provider]  Multiple Vitamins-Minerals  (MULTIVITAMIN WITH MINERALS) tablet Take 1 tablet by mouth daily.    [provider]  nitroGLYCERIN (NITROSTAT) 0.4 MG SL tablet Place 0.4 mg under the tongue every 5 (five) minutes as needed for chest pain.    [provider]  pregabalin (LYRICA) 50 MG capsule Take 50 mg by mouth 2 (two) times daily. 07/10/21   [provider]  rosuvastatin (CRESTOR) 40 MG tablet Take 1 tablet (40 mg total) by mouth at bedtime. 08/08/21   Ezekiel Slocumb, DO    Allergies  Allergen Reactions   Amlodipine Swelling    Tongue swelling Other reaction(s): SWELLING   Levsin [Hyoscyamine] Anaphylaxis, Swelling and Rash   Beta Adrenergic Blockers Other (See Comments)    unknown   Hydralazine    Hydrochlorothiazide Other (See Comments)    "draws out too much fluid"   Other     Influenza virus vaccine tvs 2012-13 (18+) cell der   Zetia [Ezetimibe]     Muscle spasms, numbness, tingling     History reviewed. No pertinent family history.  Social History Social History   Tobacco Use   Smoking status: Former    Types: Cigarettes    Quit date: 12/17/1981    Years since quitting: 39.7   Smokeless tobacco: Never  Substance Use Topics   Alcohol use: No   Drug use: No    Review of Systems Constitutional: Negative for fever. Cardiovascular: Negative for chest pain. Respiratory: Intermittent shortness of breath. Gastrointestinal: Intermittent epigastric pain, moderate dull to burning type discomfort. Genitourinary: Negative for urinary compaints Musculoskeletal: Negative for musculoskeletal complaints Neurological: Negative for headache All other ROS negative  ____________________________________________   PHYSICAL EXAM:  VITAL SIGNS: ED Triage Vitals  Enc Vitals Group     BP 08/29/21 0627 (!) 149/94     Pulse Rate 08/29/21 0627 76     Resp 08/29/21 0627 18     Temp 08/29/21 0627 97.9 F (36.6 C)     Temp Source 08/29/21 0627 Oral     SpO2 08/29/21 0627 99 %      Weight 08/29/21 0627 207 lb (93.9 kg)     Height 08/29/21 0627 '6\' 2"'$  (1.88 m)     Head Circumference --      Peak Flow --      Pain Score 08/29/21 0650 0     Pain Loc --      Pain Edu? --      Excl. in [place]? --    Constitutional: Alert and oriented. Well appearing and in no distress. Eyes: Normal exam ENT      Head: Normocephalic and atraumatic.      Mouth/Throat: Mucous membranes are moist. Cardiovascular: Normal rate, regular rhythm.  Respiratory: Normal respiratory effort without tachypnea nor retractions. Breath sounds are clear Gastrointestinal: Soft, mild epigastric tenderness palpation otherwise benign abdomen without rebound guarding or  distention. Musculoskeletal: Nontender with normal range of motion in all extremities.  Neurologic:  Normal speech and language. No gross focal neurologic deficits Skin:  Skin is warm, dry and intact.  Psychiatric: Mood and affect are normal.   ____________________________________________    EKG  EKG viewed and interpreted by myself shows a normal sinus rhythm at 77 bpm with narrow QRS, left axis deviation, slight PR prolongation otherwise normal intervals with nonspecific ST changes.  ____________________________________________    RADIOLOGY  X-rays negative for acute abnormality.  ____________________________________________   INITIAL IMPRESSION / ASSESSMENT AND PLAN / ED COURSE  Pertinent labs & imaging results that were available during my care of the patient were reviewed by me and considered in my medical decision making (see chart for details).   Patient presents emergency department for epigastric pain over the past 2 to 3 days intermittent.  States somewhat relieved by Tums but the pain comes back.  Patient was in the emergency department overnight but left due to the weight and then came back again this morning.  Lab work and x-rays/EKG is split over the last 2 visits, however I have reviewed this and it all appears  reassuring including negative troponin x2.  Largely baseline labs for the patient.  Reassuring EKG and negative chest x-ray.  I have added on a lipase and hepatic function panel as a precaution given the epigastric nature of this discomfort.  We will also obtain a right upper quadrant ultrasound as the wife states she has noted that the pain appears to occur approximately 1 hour after eating.  Patient also states he has had similar symptoms with reflux in the past and does state some improvement with Tums.  Patient's work-up is reassuring.  Ultrasound is negative.  Lipase and hepatic function panel are reassuring.  Troponin negative x2.  Given the patient's reassuring work-up we will discharge patient home with GI cocktails which have provided some relief for the patient.  We will have the patient follow-up with his doctor.  Jonathyn Bacus was evaluated in Emergency Department on 08/29/2021 for the symptoms described in the history of present illness. He was evaluated in the context of the global COVID-19 pandemic, which necessitated consideration that the patient might be at risk for infection with the SARS-CoV-2 virus that causes COVID-19. Institutional protocols and algorithms that pertain to the evaluation of patients at risk for COVID-19 are in a state of rapid change based on information released by regulatory bodies including the CDC and federal and state organizations. These policies and algorithms were followed during the patient's care in the ED.  ____________________________________________   FINAL CLINICAL IMPRESSION(S) / ED DIAGNOSES  Epigastric pain   Harvest Dark, MD 08/29/21 1435

## 2021-08-31 DIAGNOSIS — M12811 Other specific arthropathies, not elsewhere classified, right shoulder: Secondary | ICD-10-CM | POA: Diagnosis not present

## 2021-08-31 DIAGNOSIS — M75121 Complete rotator cuff tear or rupture of right shoulder, not specified as traumatic: Secondary | ICD-10-CM | POA: Diagnosis not present

## 2021-09-01 ENCOUNTER — Other Ambulatory Visit: Payer: Self-pay | Admitting: Physical Medicine & Rehabilitation

## 2021-09-01 DIAGNOSIS — M9931 Osseous stenosis of neural canal of cervical region: Secondary | ICD-10-CM | POA: Diagnosis not present

## 2021-09-01 DIAGNOSIS — M542 Cervicalgia: Secondary | ICD-10-CM | POA: Diagnosis not present

## 2021-09-01 DIAGNOSIS — M5412 Radiculopathy, cervical region: Secondary | ICD-10-CM | POA: Diagnosis not present

## 2021-09-02 ENCOUNTER — Other Ambulatory Visit: Payer: Self-pay | Admitting: Internal Medicine

## 2021-09-08 DIAGNOSIS — R079 Chest pain, unspecified: Secondary | ICD-10-CM | POA: Diagnosis not present

## 2021-09-08 DIAGNOSIS — R0602 Shortness of breath: Secondary | ICD-10-CM | POA: Diagnosis not present

## 2021-09-08 DIAGNOSIS — Z955 Presence of coronary angioplasty implant and graft: Secondary | ICD-10-CM | POA: Diagnosis not present

## 2021-09-08 DIAGNOSIS — I2510 Atherosclerotic heart disease of native coronary artery without angina pectoris: Secondary | ICD-10-CM | POA: Diagnosis not present

## 2021-09-14 ENCOUNTER — Other Ambulatory Visit: Payer: Self-pay | Admitting: Internal Medicine

## 2021-09-16 DIAGNOSIS — S32401D Unspecified fracture of right acetabulum, subsequent encounter for fracture with routine healing: Secondary | ICD-10-CM | POA: Diagnosis not present

## 2021-09-16 DIAGNOSIS — S32461A Displaced associated transverse-posterior fracture of right acetabulum, initial encounter for closed fracture: Secondary | ICD-10-CM | POA: Diagnosis not present

## 2021-09-22 DIAGNOSIS — I38 Endocarditis, valve unspecified: Secondary | ICD-10-CM | POA: Diagnosis not present

## 2021-09-22 DIAGNOSIS — R0602 Shortness of breath: Secondary | ICD-10-CM | POA: Diagnosis not present

## 2021-09-24 ENCOUNTER — Ambulatory Visit
Admission: RE | Admit: 2021-09-24 | Discharge: 2021-09-24 | Disposition: A | Discharge status: Home / Self Care | Payer: PPO | Source: Ambulatory Visit | Attending: Physical Medicine & Rehabilitation | Admitting: Physical Medicine & Rehabilitation

## 2021-09-24 DIAGNOSIS — M542 Cervicalgia: Secondary | ICD-10-CM

## 2021-09-24 DIAGNOSIS — M47812 Spondylosis without myelopathy or radiculopathy, cervical region: Secondary | ICD-10-CM | POA: Diagnosis not present

## 2021-09-24 MED ORDER — IOPAMIDOL (ISOVUE-M 300) INJECTION 61%
1.0000 mL | Freq: Once | INTRAMUSCULAR | Status: AC | PRN
  Administered 2021-09-24: 1 mL via EPIDURAL

## 2021-09-24 MED ORDER — TRIAMCINOLONE ACETONIDE 40 MG/ML IJ SUSP (RADIOLOGY)
60.0000 mg | Freq: Once | INTRAMUSCULAR | Status: AC
  Administered 2021-09-24: 60 mg via EPIDURAL

## 2021-09-24 NOTE — Discharge Instructions (Signed)

## 2021-09-28 DIAGNOSIS — R55 Syncope and collapse: Secondary | ICD-10-CM | POA: Diagnosis not present

## 2021-09-28 DIAGNOSIS — M1651 Unilateral post-traumatic osteoarthritis, right hip: Secondary | ICD-10-CM | POA: Diagnosis not present

## 2021-09-28 DIAGNOSIS — M80051A Age-related osteoporosis with current pathological fracture, right femur, initial encounter for fracture: Secondary | ICD-10-CM | POA: Diagnosis not present

## 2021-09-28 DIAGNOSIS — S32461D Displaced associated transverse-posterior fracture of right acetabulum, subsequent encounter for fracture with routine healing: Secondary | ICD-10-CM | POA: Diagnosis not present

## 2021-09-28 DIAGNOSIS — S32461A Displaced associated transverse-posterior fracture of right acetabulum, initial encounter for closed fracture: Secondary | ICD-10-CM | POA: Diagnosis not present

## 2021-09-28 DIAGNOSIS — Z01818 Encounter for other preprocedural examination: Secondary | ICD-10-CM | POA: Diagnosis not present

## 2021-09-29 ENCOUNTER — Other Ambulatory Visit: Payer: Self-pay | Admitting: Internal Medicine

## 2021-09-29 DIAGNOSIS — I119 Hypertensive heart disease without heart failure: Secondary | ICD-10-CM | POA: Diagnosis not present

## 2021-09-29 DIAGNOSIS — I10 Essential (primary) hypertension: Secondary | ICD-10-CM | POA: Diagnosis not present

## 2021-09-29 DIAGNOSIS — I2510 Atherosclerotic heart disease of native coronary artery without angina pectoris: Secondary | ICD-10-CM | POA: Diagnosis not present

## 2021-09-29 DIAGNOSIS — E782 Mixed hyperlipidemia: Secondary | ICD-10-CM | POA: Diagnosis not present

## 2021-09-29 DIAGNOSIS — I38 Endocarditis, valve unspecified: Secondary | ICD-10-CM | POA: Diagnosis not present

## 2021-09-30 ENCOUNTER — Other Ambulatory Visit: Payer: Self-pay | Admitting: Internal Medicine

## 2021-09-30 DIAGNOSIS — R600 Localized edema: Secondary | ICD-10-CM

## 2021-10-01 ENCOUNTER — Ambulatory Visit
Admission: RE | Admit: 2021-10-01 | Discharge: 2021-10-01 | Disposition: A | Discharge status: Home / Self Care | Payer: PPO | Source: Ambulatory Visit | Attending: Internal Medicine | Admitting: Internal Medicine

## 2021-10-01 ENCOUNTER — Other Ambulatory Visit: Payer: Self-pay

## 2021-10-01 DIAGNOSIS — R600 Localized edema: Secondary | ICD-10-CM | POA: Insufficient documentation

## 2021-10-08 DIAGNOSIS — Z87891 Personal history of nicotine dependence: Secondary | ICD-10-CM | POA: Diagnosis not present

## 2021-10-08 DIAGNOSIS — Z08 Encounter for follow-up examination after completed treatment for malignant neoplasm: Secondary | ICD-10-CM | POA: Diagnosis not present

## 2021-10-08 DIAGNOSIS — Z01818 Encounter for other preprocedural examination: Secondary | ICD-10-CM | POA: Diagnosis not present

## 2021-10-08 DIAGNOSIS — I10 Essential (primary) hypertension: Secondary | ICD-10-CM | POA: Diagnosis not present

## 2021-10-08 DIAGNOSIS — Z955 Presence of coronary angioplasty implant and graft: Secondary | ICD-10-CM | POA: Diagnosis not present

## 2021-10-08 DIAGNOSIS — I2510 Atherosclerotic heart disease of native coronary artery without angina pectoris: Secondary | ICD-10-CM | POA: Diagnosis not present

## 2021-10-08 DIAGNOSIS — M1650 Unilateral post-traumatic osteoarthritis, unspecified hip: Secondary | ICD-10-CM | POA: Diagnosis not present

## 2021-10-08 DIAGNOSIS — Z79899 Other long term (current) drug therapy: Secondary | ICD-10-CM | POA: Diagnosis not present

## 2021-10-08 DIAGNOSIS — S32461A Displaced associated transverse-posterior fracture of right acetabulum, initial encounter for closed fracture: Secondary | ICD-10-CM | POA: Diagnosis not present

## 2021-10-08 DIAGNOSIS — Z85850 Personal history of malignant neoplasm of thyroid: Secondary | ICD-10-CM | POA: Diagnosis not present

## 2021-10-08 DIAGNOSIS — I119 Hypertensive heart disease without heart failure: Secondary | ICD-10-CM | POA: Diagnosis not present

## 2021-10-08 DIAGNOSIS — S32461D Displaced associated transverse-posterior fracture of right acetabulum, subsequent encounter for fracture with routine healing: Secondary | ICD-10-CM | POA: Diagnosis not present

## 2021-10-15 DIAGNOSIS — Z888 Allergy status to other drugs, medicaments and biological substances status: Secondary | ICD-10-CM | POA: Diagnosis not present

## 2021-10-15 DIAGNOSIS — E875 Hyperkalemia: Secondary | ICD-10-CM | POA: Diagnosis not present

## 2021-10-15 DIAGNOSIS — Z955 Presence of coronary angioplasty implant and graft: Secondary | ICD-10-CM | POA: Diagnosis not present

## 2021-10-15 DIAGNOSIS — M1650 Unilateral post-traumatic osteoarthritis, unspecified hip: Secondary | ICD-10-CM | POA: Diagnosis not present

## 2021-10-15 DIAGNOSIS — M1651 Unilateral post-traumatic osteoarthritis, right hip: Secondary | ICD-10-CM | POA: Diagnosis not present

## 2021-10-15 DIAGNOSIS — I10 Essential (primary) hypertension: Secondary | ICD-10-CM | POA: Diagnosis not present

## 2021-10-15 DIAGNOSIS — I9752 Accidental puncture and laceration of a circulatory system organ or structure during other procedure: Secondary | ICD-10-CM | POA: Diagnosis not present

## 2021-10-15 DIAGNOSIS — Z87891 Personal history of nicotine dependence: Secondary | ICD-10-CM | POA: Diagnosis not present

## 2021-10-15 DIAGNOSIS — I38 Endocarditis, valve unspecified: Secondary | ICD-10-CM | POA: Diagnosis not present

## 2021-10-15 DIAGNOSIS — Z85850 Personal history of malignant neoplasm of thyroid: Secondary | ICD-10-CM | POA: Diagnosis not present

## 2021-10-15 DIAGNOSIS — M85851 Other specified disorders of bone density and structure, right thigh: Secondary | ICD-10-CM | POA: Diagnosis not present

## 2021-10-15 DIAGNOSIS — E890 Postprocedural hypothyroidism: Secondary | ICD-10-CM | POA: Diagnosis not present

## 2021-10-15 DIAGNOSIS — M80051D Age-related osteoporosis with current pathological fracture, right femur, subsequent encounter for fracture with routine healing: Secondary | ICD-10-CM | POA: Diagnosis not present

## 2021-10-15 DIAGNOSIS — I97418 Intraoperative hemorrhage and hematoma of a circulatory system organ or structure complicating other circulatory system procedure: Secondary | ICD-10-CM | POA: Diagnosis not present

## 2021-10-15 DIAGNOSIS — M109 Gout, unspecified: Secondary | ICD-10-CM | POA: Diagnosis not present

## 2021-10-15 DIAGNOSIS — Z8616 Personal history of COVID-19: Secondary | ICD-10-CM | POA: Diagnosis not present

## 2021-10-15 DIAGNOSIS — Z96641 Presence of right artificial hip joint: Secondary | ICD-10-CM | POA: Diagnosis not present

## 2021-10-15 DIAGNOSIS — M80051A Age-related osteoporosis with current pathological fracture, right femur, initial encounter for fracture: Secondary | ICD-10-CM | POA: Diagnosis not present

## 2021-10-15 DIAGNOSIS — N400 Enlarged prostate without lower urinary tract symptoms: Secondary | ICD-10-CM | POA: Diagnosis not present

## 2021-10-15 DIAGNOSIS — K2100 Gastro-esophageal reflux disease with esophagitis, without bleeding: Secondary | ICD-10-CM | POA: Diagnosis not present

## 2021-10-15 DIAGNOSIS — I2699 Other pulmonary embolism without acute cor pulmonale: Secondary | ICD-10-CM | POA: Diagnosis not present

## 2021-10-15 DIAGNOSIS — I2510 Atherosclerotic heart disease of native coronary artery without angina pectoris: Secondary | ICD-10-CM | POA: Diagnosis not present

## 2021-10-15 DIAGNOSIS — I441 Atrioventricular block, second degree: Secondary | ICD-10-CM | POA: Diagnosis not present

## 2021-10-15 DIAGNOSIS — I480 Paroxysmal atrial fibrillation: Secondary | ICD-10-CM | POA: Diagnosis not present

## 2021-10-15 DIAGNOSIS — H9193 Unspecified hearing loss, bilateral: Secondary | ICD-10-CM | POA: Diagnosis not present

## 2021-10-15 DIAGNOSIS — I252 Old myocardial infarction: Secondary | ICD-10-CM | POA: Diagnosis not present

## 2021-10-15 DIAGNOSIS — I2693 Single subsegmental pulmonary embolism without acute cor pulmonale: Secondary | ICD-10-CM | POA: Diagnosis not present

## 2021-10-15 DIAGNOSIS — E782 Mixed hyperlipidemia: Secondary | ICD-10-CM | POA: Diagnosis not present

## 2021-10-15 DIAGNOSIS — Z01810 Encounter for preprocedural cardiovascular examination: Secondary | ICD-10-CM | POA: Diagnosis not present

## 2021-10-15 DIAGNOSIS — I739 Peripheral vascular disease, unspecified: Secondary | ICD-10-CM | POA: Diagnosis not present

## 2021-10-15 DIAGNOSIS — I119 Hypertensive heart disease without heart failure: Secondary | ICD-10-CM | POA: Diagnosis not present

## 2021-10-15 DIAGNOSIS — D62 Acute posthemorrhagic anemia: Secondary | ICD-10-CM | POA: Diagnosis not present

## 2021-10-15 DIAGNOSIS — M25851 Other specified joint disorders, right hip: Secondary | ICD-10-CM | POA: Diagnosis not present

## 2021-10-15 DIAGNOSIS — M1992 Post-traumatic osteoarthritis, unspecified site: Secondary | ICD-10-CM | POA: Diagnosis not present

## 2021-10-15 DIAGNOSIS — Z96642 Presence of left artificial hip joint: Secondary | ICD-10-CM | POA: Diagnosis not present

## 2021-10-15 DIAGNOSIS — N179 Acute kidney failure, unspecified: Secondary | ICD-10-CM | POA: Diagnosis not present

## 2021-10-16 DIAGNOSIS — I2510 Atherosclerotic heart disease of native coronary artery without angina pectoris: Secondary | ICD-10-CM | POA: Diagnosis not present

## 2021-10-16 DIAGNOSIS — Z955 Presence of coronary angioplasty implant and graft: Secondary | ICD-10-CM | POA: Diagnosis not present

## 2021-10-16 DIAGNOSIS — I10 Essential (primary) hypertension: Secondary | ICD-10-CM | POA: Diagnosis not present

## 2021-10-16 DIAGNOSIS — Z96642 Presence of left artificial hip joint: Secondary | ICD-10-CM | POA: Diagnosis not present

## 2021-10-16 DIAGNOSIS — I480 Paroxysmal atrial fibrillation: Secondary | ICD-10-CM | POA: Diagnosis not present

## 2021-10-16 DIAGNOSIS — I119 Hypertensive heart disease without heart failure: Secondary | ICD-10-CM | POA: Diagnosis not present

## 2021-10-16 DIAGNOSIS — M1650 Unilateral post-traumatic osteoarthritis, unspecified hip: Secondary | ICD-10-CM | POA: Diagnosis not present

## 2021-10-16 DIAGNOSIS — I97418 Intraoperative hemorrhage and hematoma of a circulatory system organ or structure complicating other circulatory system procedure: Secondary | ICD-10-CM | POA: Diagnosis not present

## 2021-10-16 DIAGNOSIS — I2699 Other pulmonary embolism without acute cor pulmonale: Secondary | ICD-10-CM | POA: Diagnosis not present

## 2021-10-16 DIAGNOSIS — I2693 Single subsegmental pulmonary embolism without acute cor pulmonale: Secondary | ICD-10-CM | POA: Diagnosis not present

## 2021-10-17 DIAGNOSIS — I97418 Intraoperative hemorrhage and hematoma of a circulatory system organ or structure complicating other circulatory system procedure: Secondary | ICD-10-CM | POA: Diagnosis not present

## 2021-10-17 DIAGNOSIS — I2699 Other pulmonary embolism without acute cor pulmonale: Secondary | ICD-10-CM | POA: Diagnosis not present

## 2021-10-18 DIAGNOSIS — I2699 Other pulmonary embolism without acute cor pulmonale: Secondary | ICD-10-CM | POA: Diagnosis not present

## 2021-10-18 DIAGNOSIS — I97418 Intraoperative hemorrhage and hematoma of a circulatory system organ or structure complicating other circulatory system procedure: Secondary | ICD-10-CM | POA: Diagnosis not present

## 2021-10-19 DIAGNOSIS — M1650 Unilateral post-traumatic osteoarthritis, unspecified hip: Secondary | ICD-10-CM | POA: Diagnosis not present

## 2021-10-20 DIAGNOSIS — M1650 Unilateral post-traumatic osteoarthritis, unspecified hip: Secondary | ICD-10-CM | POA: Diagnosis not present

## 2021-10-21 DIAGNOSIS — E785 Hyperlipidemia, unspecified: Secondary | ICD-10-CM | POA: Diagnosis not present

## 2021-10-21 DIAGNOSIS — Z9181 History of falling: Secondary | ICD-10-CM | POA: Diagnosis not present

## 2021-10-21 DIAGNOSIS — Z8546 Personal history of malignant neoplasm of prostate: Secondary | ICD-10-CM | POA: Diagnosis not present

## 2021-10-21 DIAGNOSIS — N39498 Other specified urinary incontinence: Secondary | ICD-10-CM | POA: Diagnosis not present

## 2021-10-21 DIAGNOSIS — Z471 Aftercare following joint replacement surgery: Secondary | ICD-10-CM | POA: Diagnosis not present

## 2021-10-21 DIAGNOSIS — K5790 Diverticulosis of intestine, part unspecified, without perforation or abscess without bleeding: Secondary | ICD-10-CM | POA: Diagnosis not present

## 2021-10-21 DIAGNOSIS — Z96641 Presence of right artificial hip joint: Secondary | ICD-10-CM | POA: Diagnosis not present

## 2021-10-21 DIAGNOSIS — I252 Old myocardial infarction: Secondary | ICD-10-CM | POA: Diagnosis not present

## 2021-10-21 DIAGNOSIS — Z9089 Acquired absence of other organs: Secondary | ICD-10-CM | POA: Diagnosis not present

## 2021-10-21 DIAGNOSIS — K219 Gastro-esophageal reflux disease without esophagitis: Secondary | ICD-10-CM | POA: Diagnosis not present

## 2021-10-21 DIAGNOSIS — I10 Essential (primary) hypertension: Secondary | ICD-10-CM | POA: Diagnosis not present

## 2021-10-21 DIAGNOSIS — Z96652 Presence of left artificial knee joint: Secondary | ICD-10-CM | POA: Diagnosis not present

## 2021-10-21 DIAGNOSIS — N401 Enlarged prostate with lower urinary tract symptoms: Secondary | ICD-10-CM | POA: Diagnosis not present

## 2021-10-21 DIAGNOSIS — M1990 Unspecified osteoarthritis, unspecified site: Secondary | ICD-10-CM | POA: Diagnosis not present

## 2021-10-26 DIAGNOSIS — R946 Abnormal results of thyroid function studies: Secondary | ICD-10-CM | POA: Diagnosis not present

## 2021-10-27 DIAGNOSIS — K219 Gastro-esophageal reflux disease without esophagitis: Secondary | ICD-10-CM | POA: Diagnosis not present

## 2021-10-27 DIAGNOSIS — F411 Generalized anxiety disorder: Secondary | ICD-10-CM | POA: Diagnosis not present

## 2021-10-27 DIAGNOSIS — D649 Anemia, unspecified: Secondary | ICD-10-CM | POA: Diagnosis not present

## 2021-10-27 DIAGNOSIS — Z09 Encounter for follow-up examination after completed treatment for conditions other than malignant neoplasm: Secondary | ICD-10-CM | POA: Diagnosis not present

## 2021-10-27 DIAGNOSIS — E039 Hypothyroidism, unspecified: Secondary | ICD-10-CM | POA: Diagnosis not present

## 2021-10-29 DIAGNOSIS — G959 Disease of spinal cord, unspecified: Secondary | ICD-10-CM | POA: Diagnosis not present

## 2021-10-29 DIAGNOSIS — M5412 Radiculopathy, cervical region: Secondary | ICD-10-CM | POA: Diagnosis not present

## 2021-11-02 DIAGNOSIS — Z96641 Presence of right artificial hip joint: Secondary | ICD-10-CM | POA: Diagnosis not present

## 2021-11-02 DIAGNOSIS — M1651 Unilateral post-traumatic osteoarthritis, right hip: Secondary | ICD-10-CM | POA: Diagnosis not present

## 2021-11-08 IMAGING — US US EXTREM LOW VENOUS
1 series · 13 of 24 positions shown · non-contrast
Comparison: None.
COMPARISON: None.

Addendum:
CLINICAL DATA: 75-year-old male with lower extremity edema.
Abnormal D-dimer.

EXAM:
BILATERAL LOWER EXTREMITY VENOUS DOPPLER ULTRASOUND
TECHNIQUE: Gray-scale sonography with compression, as well as color and duplex
ultrasound, were performed to evaluate the deep venous system(s)
from the level of the common femoral vein through the popliteal and
proximal calf veins.

[Series 1: us venous img lower bilat (dvt) · portal-venous · 13 of 50 slices shown]
[im 1/50]
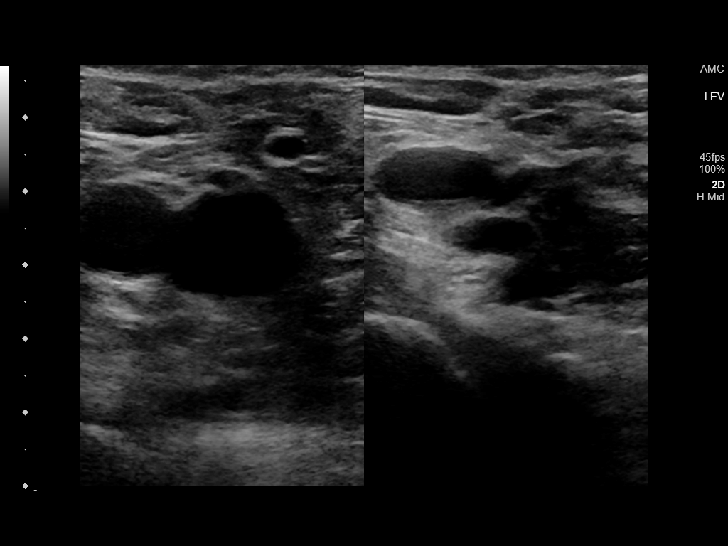
[im 5/50]
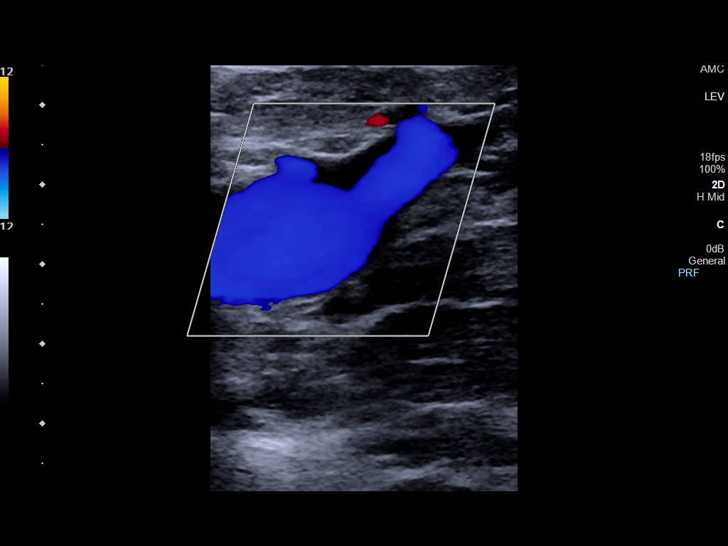
[im 9/50]
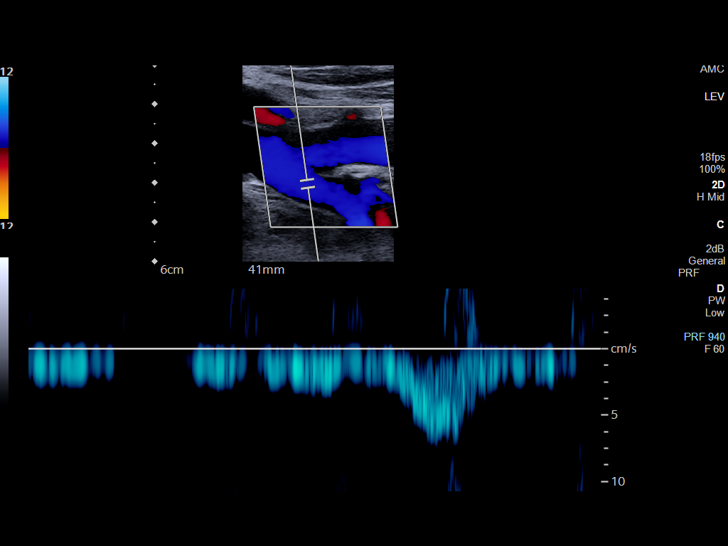
[im 13/50]
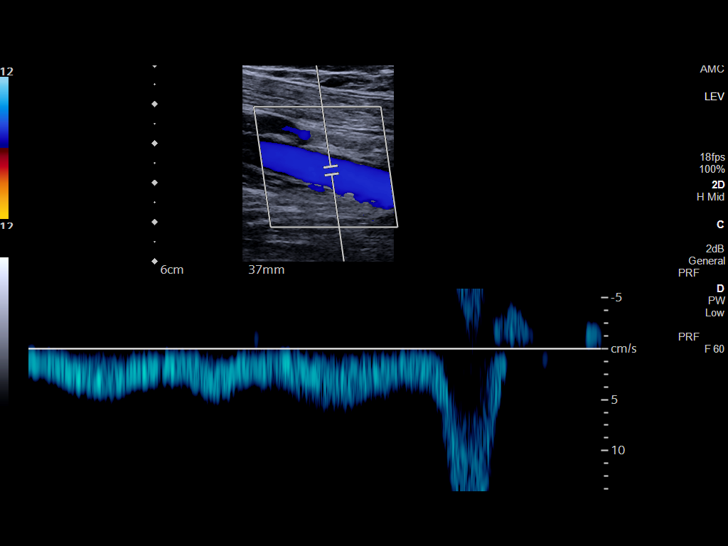
[im 18/50]
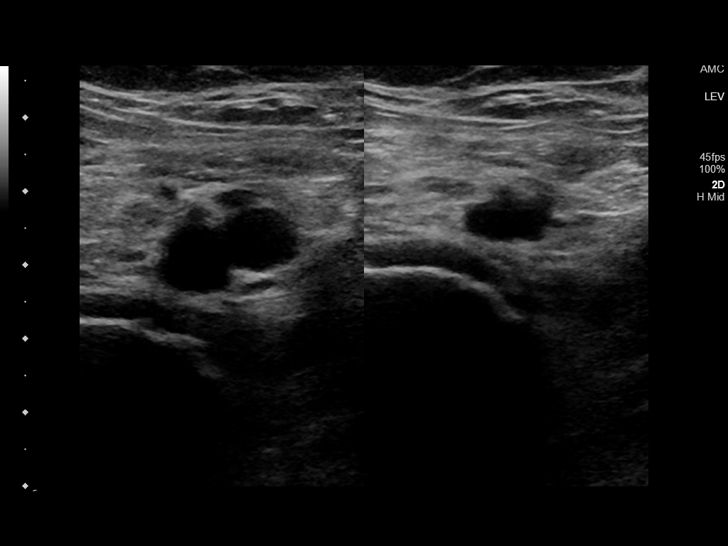
[im 22/50]
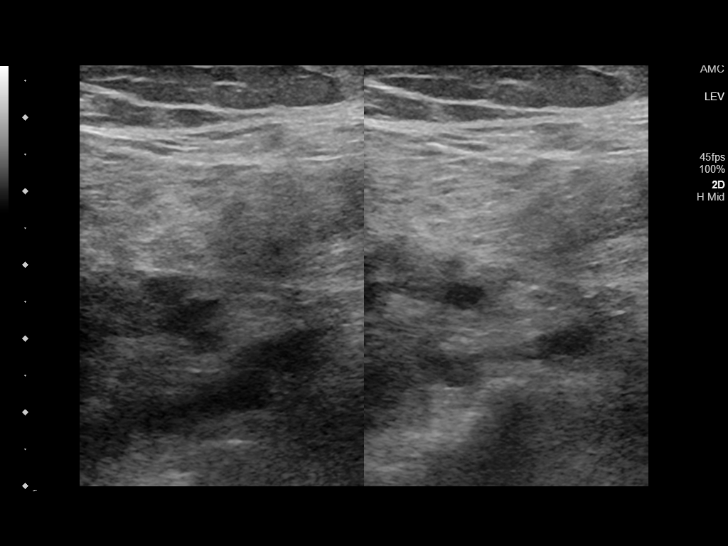
[im 26/50]
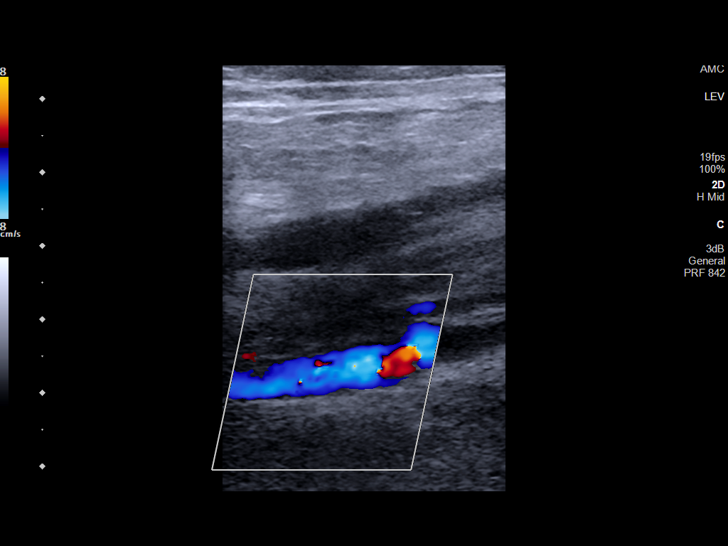
[im 28/50]
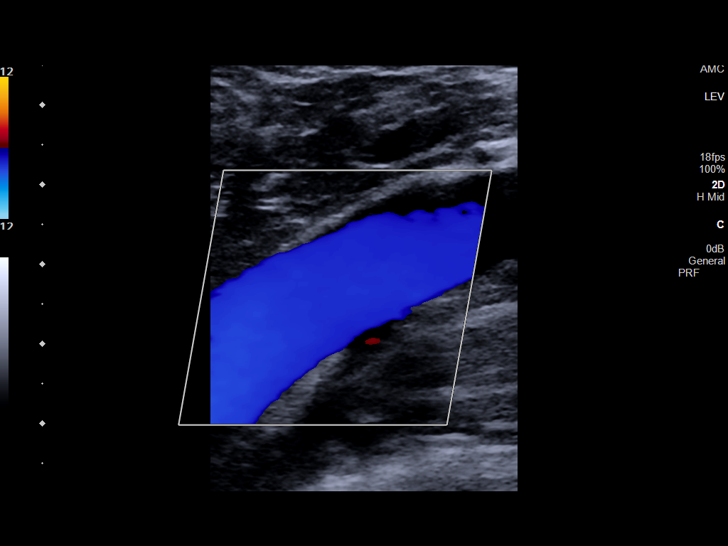
[im 32/50]
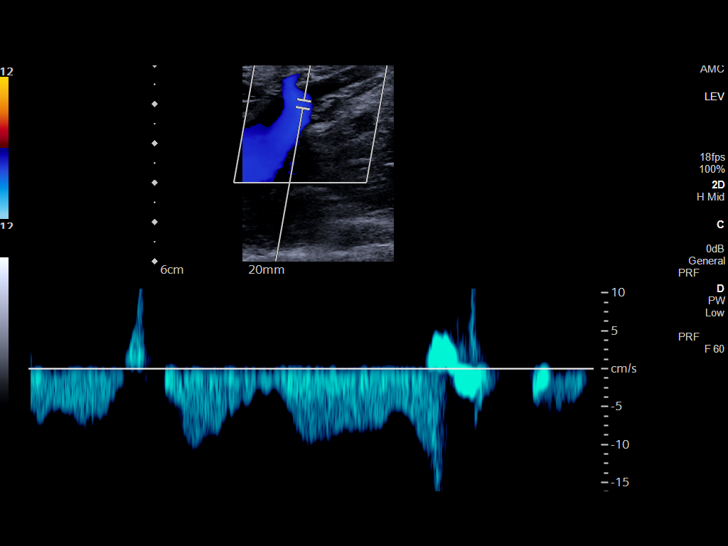
[im 37/50]
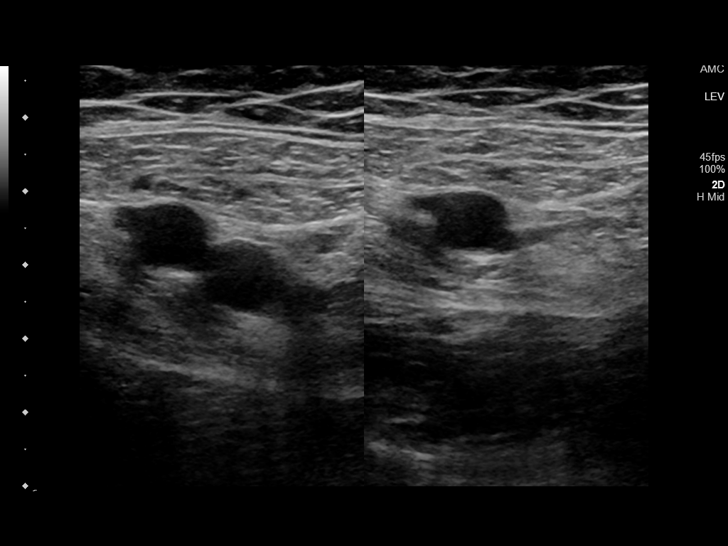
[im 41/50]
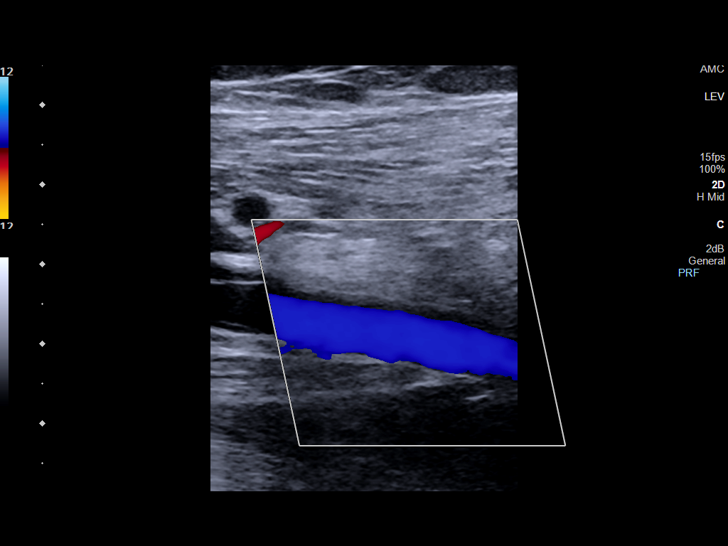
[im 45/50]
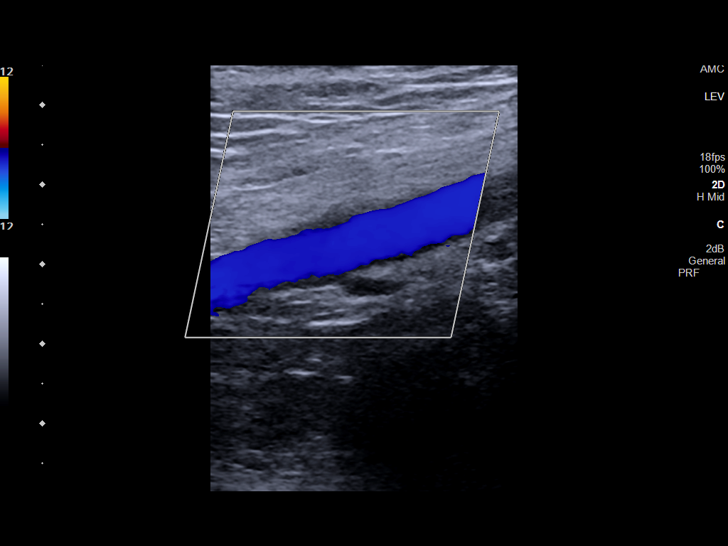
[im 50/50]
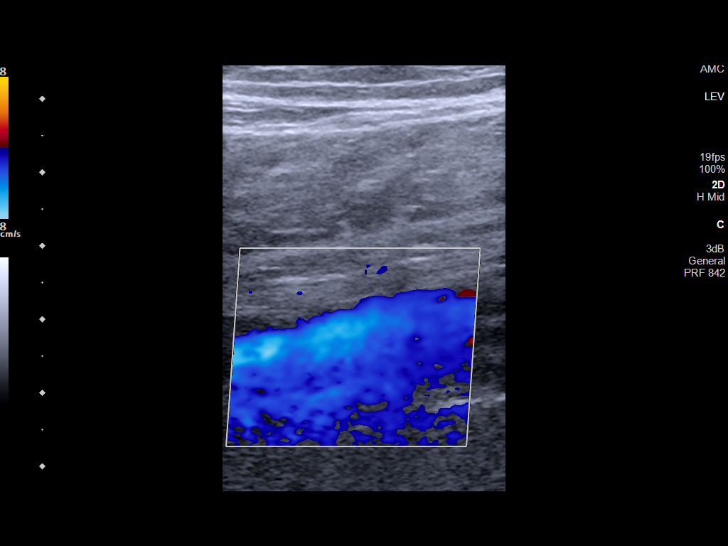

[13 of 24 positions shown; findings below may reference images not displayed]

FINDINGS: RIGHT:

Normal compressibility of the common femoral, superficial femoral,
and popliteal veins. Visualized portions of profunda femoral vein
and great saphenous vein unremarkable.

But they a right calf veins are incompressible with hypoechoic
thrombus (image 22, 25). Posterior tibial and peroneal vessels are
affected.

No other filling defects to suggest DVT on grayscale or color
Doppler imaging.

LEFT:

Normal compressibility of the common femoral, superficial femoral,
and popliteal veins, as well as the visualized calf veins.
Visualized portions of profunda femoral vein and great saphenous
vein unremarkable. No filling defects to suggest DVT on grayscale or
color Doppler imaging. Doppler waveforms show normal direction of
venous flow, normal respiratory plasticity and response to
augmentation.

OTHER

None.

Limitations: none
IMPRESSION: 1. Positive for Right Calf DVT.
2. Negative for left lower extremity deep venous thrombosis.

ADDENDUM:
Study discussed by telephone with Dr. Sablon on 05/31/2021 at 4513
hours.

*** End of Addendum ***
FINDINGS: RIGHT:

Normal compressibility of the common femoral, superficial femoral,
and popliteal veins. Visualized portions of profunda femoral vein
and great saphenous vein unremarkable.

But they a right calf veins are incompressible with hypoechoic
thrombus (image 22, 25). Posterior tibial and peroneal vessels are
affected.

No other filling defects to suggest DVT on grayscale or color
Doppler imaging.

LEFT:

Normal compressibility of the common femoral, superficial femoral,
and popliteal veins, as well as the visualized calf veins.
Visualized portions of profunda femoral vein and great saphenous
vein unremarkable. No filling defects to suggest DVT on grayscale or
color Doppler imaging. Doppler waveforms show normal direction of
venous flow, normal respiratory plasticity and response to
augmentation.

OTHER

None.

Limitations: none
IMPRESSION: 1. Positive for Right Calf DVT.
2. Negative for left lower extremity deep venous thrombosis.

## 2021-11-13 DIAGNOSIS — M25562 Pain in left knee: Secondary | ICD-10-CM | POA: Diagnosis not present

## 2021-11-13 DIAGNOSIS — M25561 Pain in right knee: Secondary | ICD-10-CM | POA: Diagnosis not present

## 2021-11-13 DIAGNOSIS — E039 Hypothyroidism, unspecified: Secondary | ICD-10-CM | POA: Diagnosis not present

## 2021-11-13 DIAGNOSIS — M170 Bilateral primary osteoarthritis of knee: Secondary | ICD-10-CM | POA: Diagnosis not present

## 2021-11-13 DIAGNOSIS — M25462 Effusion, left knee: Secondary | ICD-10-CM | POA: Diagnosis not present

## 2021-11-13 DIAGNOSIS — G8929 Other chronic pain: Secondary | ICD-10-CM | POA: Diagnosis not present

## 2021-11-16 DIAGNOSIS — Z9089 Acquired absence of other organs: Secondary | ICD-10-CM | POA: Diagnosis not present

## 2021-11-16 DIAGNOSIS — I10 Essential (primary) hypertension: Secondary | ICD-10-CM | POA: Diagnosis not present

## 2021-11-16 DIAGNOSIS — E785 Hyperlipidemia, unspecified: Secondary | ICD-10-CM | POA: Diagnosis not present

## 2021-11-16 DIAGNOSIS — Z8546 Personal history of malignant neoplasm of prostate: Secondary | ICD-10-CM | POA: Diagnosis not present

## 2021-11-16 DIAGNOSIS — K5790 Diverticulosis of intestine, part unspecified, without perforation or abscess without bleeding: Secondary | ICD-10-CM | POA: Diagnosis not present

## 2021-11-16 DIAGNOSIS — N401 Enlarged prostate with lower urinary tract symptoms: Secondary | ICD-10-CM | POA: Diagnosis not present

## 2021-11-16 DIAGNOSIS — K219 Gastro-esophageal reflux disease without esophagitis: Secondary | ICD-10-CM | POA: Diagnosis not present

## 2021-11-16 DIAGNOSIS — N39498 Other specified urinary incontinence: Secondary | ICD-10-CM | POA: Diagnosis not present

## 2021-11-16 DIAGNOSIS — Z96641 Presence of right artificial hip joint: Secondary | ICD-10-CM | POA: Diagnosis not present

## 2021-11-16 DIAGNOSIS — M1990 Unspecified osteoarthritis, unspecified site: Secondary | ICD-10-CM | POA: Diagnosis not present

## 2021-11-16 DIAGNOSIS — Z471 Aftercare following joint replacement surgery: Secondary | ICD-10-CM | POA: Diagnosis not present

## 2021-11-16 DIAGNOSIS — Z9181 History of falling: Secondary | ICD-10-CM | POA: Diagnosis not present

## 2021-11-16 DIAGNOSIS — I252 Old myocardial infarction: Secondary | ICD-10-CM | POA: Diagnosis not present

## 2021-11-16 DIAGNOSIS — Z96652 Presence of left artificial knee joint: Secondary | ICD-10-CM | POA: Diagnosis not present

## 2021-11-17 DIAGNOSIS — Z0000 Encounter for general adult medical examination without abnormal findings: Secondary | ICD-10-CM | POA: Diagnosis not present

## 2021-11-17 DIAGNOSIS — R7309 Other abnormal glucose: Secondary | ICD-10-CM | POA: Diagnosis not present

## 2021-11-17 DIAGNOSIS — D649 Anemia, unspecified: Secondary | ICD-10-CM | POA: Diagnosis not present

## 2021-11-17 DIAGNOSIS — I2510 Atherosclerotic heart disease of native coronary artery without angina pectoris: Secondary | ICD-10-CM | POA: Diagnosis not present

## 2021-11-17 DIAGNOSIS — I480 Paroxysmal atrial fibrillation: Secondary | ICD-10-CM | POA: Diagnosis not present

## 2021-11-17 DIAGNOSIS — I10 Essential (primary) hypertension: Secondary | ICD-10-CM | POA: Diagnosis not present

## 2021-11-17 DIAGNOSIS — R0982 Postnasal drip: Secondary | ICD-10-CM | POA: Diagnosis not present

## 2021-11-17 DIAGNOSIS — I38 Endocarditis, valve unspecified: Secondary | ICD-10-CM | POA: Diagnosis not present

## 2021-11-17 DIAGNOSIS — J3489 Other specified disorders of nose and nasal sinuses: Secondary | ICD-10-CM | POA: Diagnosis not present

## 2021-11-17 DIAGNOSIS — J0181 Other acute recurrent sinusitis: Secondary | ICD-10-CM | POA: Diagnosis not present

## 2021-11-30 DIAGNOSIS — S32461A Displaced associated transverse-posterior fracture of right acetabulum, initial encounter for closed fracture: Secondary | ICD-10-CM | POA: Diagnosis not present

## 2021-11-30 DIAGNOSIS — Z79899 Other long term (current) drug therapy: Secondary | ICD-10-CM | POA: Diagnosis not present

## 2021-11-30 DIAGNOSIS — Z96641 Presence of right artificial hip joint: Secondary | ICD-10-CM | POA: Diagnosis not present

## 2021-11-30 DIAGNOSIS — M1651 Unilateral post-traumatic osteoarthritis, right hip: Secondary | ICD-10-CM | POA: Diagnosis not present

## 2021-11-30 DIAGNOSIS — M80051A Age-related osteoporosis with current pathological fracture, right femur, initial encounter for fracture: Secondary | ICD-10-CM | POA: Diagnosis not present

## 2021-11-30 DIAGNOSIS — Z471 Aftercare following joint replacement surgery: Secondary | ICD-10-CM | POA: Diagnosis not present

## 2021-11-30 DIAGNOSIS — M47816 Spondylosis without myelopathy or radiculopathy, lumbar region: Secondary | ICD-10-CM | POA: Diagnosis not present

## 2021-12-04 DIAGNOSIS — S32431A Displaced fracture of anterior column [iliopubic] of right acetabulum, initial encounter for closed fracture: Secondary | ICD-10-CM | POA: Diagnosis not present

## 2021-12-10 DIAGNOSIS — I2510 Atherosclerotic heart disease of native coronary artery without angina pectoris: Secondary | ICD-10-CM | POA: Diagnosis not present

## 2021-12-10 DIAGNOSIS — R110 Nausea: Secondary | ICD-10-CM | POA: Diagnosis not present

## 2021-12-10 DIAGNOSIS — R1032 Left lower quadrant pain: Secondary | ICD-10-CM | POA: Diagnosis not present

## 2021-12-10 DIAGNOSIS — I480 Paroxysmal atrial fibrillation: Secondary | ICD-10-CM | POA: Diagnosis not present

## 2021-12-10 DIAGNOSIS — I10 Essential (primary) hypertension: Secondary | ICD-10-CM | POA: Diagnosis not present

## 2021-12-10 DIAGNOSIS — R1031 Right lower quadrant pain: Secondary | ICD-10-CM | POA: Diagnosis not present

## 2021-12-11 ENCOUNTER — Other Ambulatory Visit: Payer: Self-pay | Admitting: Internal Medicine

## 2021-12-11 DIAGNOSIS — M1711 Unilateral primary osteoarthritis, right knee: Secondary | ICD-10-CM | POA: Diagnosis not present

## 2021-12-11 DIAGNOSIS — M25562 Pain in left knee: Secondary | ICD-10-CM | POA: Diagnosis not present

## 2021-12-11 DIAGNOSIS — R1031 Right lower quadrant pain: Secondary | ICD-10-CM

## 2021-12-11 DIAGNOSIS — M1712 Unilateral primary osteoarthritis, left knee: Secondary | ICD-10-CM | POA: Diagnosis not present

## 2021-12-11 DIAGNOSIS — R110 Nausea: Secondary | ICD-10-CM

## 2021-12-11 DIAGNOSIS — G8929 Other chronic pain: Secondary | ICD-10-CM | POA: Diagnosis not present

## 2021-12-11 DIAGNOSIS — M25561 Pain in right knee: Secondary | ICD-10-CM | POA: Diagnosis not present

## 2021-12-11 DIAGNOSIS — M25462 Effusion, left knee: Secondary | ICD-10-CM | POA: Diagnosis not present

## 2021-12-18 ENCOUNTER — Other Ambulatory Visit: Payer: Self-pay

## 2021-12-18 ENCOUNTER — Ambulatory Visit
Admission: RE | Admit: 2021-12-18 | Discharge: 2021-12-18 | Disposition: A | Discharge status: Home / Self Care | Payer: Medicare HMO | Source: Ambulatory Visit | Attending: Internal Medicine | Admitting: Internal Medicine

## 2021-12-18 DIAGNOSIS — R1031 Right lower quadrant pain: Secondary | ICD-10-CM | POA: Diagnosis not present

## 2021-12-18 DIAGNOSIS — R1032 Left lower quadrant pain: Secondary | ICD-10-CM | POA: Diagnosis not present

## 2021-12-18 DIAGNOSIS — R110 Nausea: Secondary | ICD-10-CM | POA: Diagnosis not present

## 2021-12-18 LAB — POCT I-STAT CREATININE: Creatinine, Ser: 1.3 mg/dL — ABNORMAL HIGH (ref 0.61–1.24)

## 2021-12-18 MED ORDER — IOHEXOL 300 MG/ML  SOLN
100.0000 mL | Freq: Once | INTRAMUSCULAR | Status: AC | PRN
  Administered 2021-12-18: 100 mL via INTRAVENOUS

## 2021-12-31 DIAGNOSIS — M5412 Radiculopathy, cervical region: Secondary | ICD-10-CM | POA: Diagnosis not present

## 2021-12-31 DIAGNOSIS — G959 Disease of spinal cord, unspecified: Secondary | ICD-10-CM | POA: Diagnosis not present

## 2022-01-04 DIAGNOSIS — R972 Elevated prostate specific antigen [PSA]: Secondary | ICD-10-CM | POA: Diagnosis not present

## 2022-01-04 DIAGNOSIS — N401 Enlarged prostate with lower urinary tract symptoms: Secondary | ICD-10-CM | POA: Diagnosis not present

## 2022-01-11 DIAGNOSIS — M1651 Unilateral post-traumatic osteoarthritis, right hip: Secondary | ICD-10-CM | POA: Diagnosis not present

## 2022-01-11 DIAGNOSIS — E118 Type 2 diabetes mellitus with unspecified complications: Secondary | ICD-10-CM | POA: Diagnosis not present

## 2022-01-11 DIAGNOSIS — Z96641 Presence of right artificial hip joint: Secondary | ICD-10-CM | POA: Diagnosis not present

## 2022-01-11 DIAGNOSIS — Z471 Aftercare following joint replacement surgery: Secondary | ICD-10-CM | POA: Diagnosis not present

## 2022-01-13 DIAGNOSIS — I480 Paroxysmal atrial fibrillation: Secondary | ICD-10-CM | POA: Diagnosis not present

## 2022-01-13 DIAGNOSIS — M80051D Age-related osteoporosis with current pathological fracture, right femur, subsequent encounter for fracture with routine healing: Secondary | ICD-10-CM | POA: Diagnosis not present

## 2022-01-13 DIAGNOSIS — Z955 Presence of coronary angioplasty implant and graft: Secondary | ICD-10-CM | POA: Diagnosis not present

## 2022-01-13 DIAGNOSIS — S0240ED Zygomatic fracture, right side, subsequent encounter for fracture with routine healing: Secondary | ICD-10-CM | POA: Diagnosis not present

## 2022-01-13 DIAGNOSIS — Z4789 Encounter for other orthopedic aftercare: Secondary | ICD-10-CM | POA: Diagnosis not present

## 2022-01-13 DIAGNOSIS — I2510 Atherosclerotic heart disease of native coronary artery without angina pectoris: Secondary | ICD-10-CM | POA: Diagnosis not present

## 2022-01-13 DIAGNOSIS — Z85850 Personal history of malignant neoplasm of thyroid: Secondary | ICD-10-CM | POA: Diagnosis not present

## 2022-01-13 DIAGNOSIS — M4802 Spinal stenosis, cervical region: Secondary | ICD-10-CM | POA: Diagnosis not present

## 2022-01-13 DIAGNOSIS — M5001 Cervical disc disorder with myelopathy,  high cervical region: Secondary | ICD-10-CM | POA: Diagnosis not present

## 2022-01-13 DIAGNOSIS — Z9079 Acquired absence of other genital organ(s): Secondary | ICD-10-CM | POA: Diagnosis not present

## 2022-01-13 DIAGNOSIS — Z9889 Other specified postprocedural states: Secondary | ICD-10-CM | POA: Diagnosis not present

## 2022-01-13 DIAGNOSIS — I10 Essential (primary) hypertension: Secondary | ICD-10-CM | POA: Diagnosis not present

## 2022-01-13 DIAGNOSIS — Z96641 Presence of right artificial hip joint: Secondary | ICD-10-CM | POA: Diagnosis not present

## 2022-01-13 DIAGNOSIS — S32461D Displaced associated transverse-posterior fracture of right acetabulum, subsequent encounter for fracture with routine healing: Secondary | ICD-10-CM | POA: Diagnosis not present

## 2022-01-13 DIAGNOSIS — M5011 Cervical disc disorder with radiculopathy,  high cervical region: Secondary | ICD-10-CM | POA: Diagnosis not present

## 2022-01-13 DIAGNOSIS — N400 Enlarged prostate without lower urinary tract symptoms: Secondary | ICD-10-CM | POA: Diagnosis not present

## 2022-01-19 DIAGNOSIS — S0240ED Zygomatic fracture, right side, subsequent encounter for fracture with routine healing: Secondary | ICD-10-CM | POA: Diagnosis not present

## 2022-01-25 DIAGNOSIS — Z85850 Personal history of malignant neoplasm of thyroid: Secondary | ICD-10-CM | POA: Diagnosis not present

## 2022-01-25 DIAGNOSIS — E890 Postprocedural hypothyroidism: Secondary | ICD-10-CM | POA: Diagnosis not present

## 2022-02-02 DIAGNOSIS — E890 Postprocedural hypothyroidism: Secondary | ICD-10-CM | POA: Diagnosis not present

## 2022-02-02 DIAGNOSIS — Z85850 Personal history of malignant neoplasm of thyroid: Secondary | ICD-10-CM | POA: Diagnosis not present

## 2022-02-03 DIAGNOSIS — Z96641 Presence of right artificial hip joint: Secondary | ICD-10-CM | POA: Diagnosis not present

## 2022-02-03 DIAGNOSIS — Z85850 Personal history of malignant neoplasm of thyroid: Secondary | ICD-10-CM | POA: Diagnosis not present

## 2022-02-03 DIAGNOSIS — N400 Enlarged prostate without lower urinary tract symptoms: Secondary | ICD-10-CM | POA: Diagnosis not present

## 2022-02-03 DIAGNOSIS — Z7901 Long term (current) use of anticoagulants: Secondary | ICD-10-CM | POA: Diagnosis not present

## 2022-02-03 DIAGNOSIS — Z9889 Other specified postprocedural states: Secondary | ICD-10-CM | POA: Diagnosis not present

## 2022-02-03 DIAGNOSIS — I10 Essential (primary) hypertension: Secondary | ICD-10-CM | POA: Diagnosis not present

## 2022-02-03 DIAGNOSIS — S0240ED Zygomatic fracture, right side, subsequent encounter for fracture with routine healing: Secondary | ICD-10-CM | POA: Diagnosis not present

## 2022-02-03 DIAGNOSIS — I2510 Atherosclerotic heart disease of native coronary artery without angina pectoris: Secondary | ICD-10-CM | POA: Diagnosis not present

## 2022-02-03 DIAGNOSIS — Z9079 Acquired absence of other genital organ(s): Secondary | ICD-10-CM | POA: Diagnosis not present

## 2022-02-08 DIAGNOSIS — I10 Essential (primary) hypertension: Secondary | ICD-10-CM | POA: Diagnosis not present

## 2022-02-08 DIAGNOSIS — Z96642 Presence of left artificial hip joint: Secondary | ICD-10-CM | POA: Diagnosis not present

## 2022-02-08 DIAGNOSIS — K219 Gastro-esophageal reflux disease without esophagitis: Secondary | ICD-10-CM | POA: Diagnosis not present

## 2022-02-12 DIAGNOSIS — M1711 Unilateral primary osteoarthritis, right knee: Secondary | ICD-10-CM | POA: Diagnosis not present

## 2022-02-12 DIAGNOSIS — M25561 Pain in right knee: Secondary | ICD-10-CM | POA: Diagnosis not present

## 2022-02-12 DIAGNOSIS — G8929 Other chronic pain: Secondary | ICD-10-CM | POA: Diagnosis not present

## 2022-02-18 DIAGNOSIS — M25551 Pain in right hip: Secondary | ICD-10-CM | POA: Diagnosis not present

## 2022-02-18 DIAGNOSIS — Z96641 Presence of right artificial hip joint: Secondary | ICD-10-CM | POA: Diagnosis not present

## 2022-02-18 DIAGNOSIS — M25651 Stiffness of right hip, not elsewhere classified: Secondary | ICD-10-CM | POA: Diagnosis not present

## 2022-02-18 DIAGNOSIS — R29898 Other symptoms and signs involving the musculoskeletal system: Secondary | ICD-10-CM | POA: Diagnosis not present

## 2022-02-25 DIAGNOSIS — Z96641 Presence of right artificial hip joint: Secondary | ICD-10-CM | POA: Diagnosis not present

## 2022-02-25 DIAGNOSIS — M25551 Pain in right hip: Secondary | ICD-10-CM | POA: Diagnosis not present

## 2022-02-26 DIAGNOSIS — J329 Chronic sinusitis, unspecified: Secondary | ICD-10-CM | POA: Diagnosis not present

## 2022-02-26 DIAGNOSIS — H6983 Other specified disorders of Eustachian tube, bilateral: Secondary | ICD-10-CM | POA: Diagnosis not present

## 2022-03-01 DIAGNOSIS — Z96641 Presence of right artificial hip joint: Secondary | ICD-10-CM | POA: Diagnosis not present

## 2022-03-01 DIAGNOSIS — M25551 Pain in right hip: Secondary | ICD-10-CM | POA: Diagnosis not present

## 2022-03-04 DIAGNOSIS — Z96641 Presence of right artificial hip joint: Secondary | ICD-10-CM | POA: Diagnosis not present

## 2022-03-09 DIAGNOSIS — M25551 Pain in right hip: Secondary | ICD-10-CM | POA: Diagnosis not present

## 2022-03-09 DIAGNOSIS — Z96641 Presence of right artificial hip joint: Secondary | ICD-10-CM | POA: Diagnosis not present

## 2022-03-11 ENCOUNTER — Other Ambulatory Visit (HOSPITAL_COMMUNITY): Payer: Self-pay | Admitting: Gastroenterology

## 2022-03-11 ENCOUNTER — Other Ambulatory Visit: Payer: Self-pay | Admitting: Gastroenterology

## 2022-03-11 DIAGNOSIS — R1013 Epigastric pain: Secondary | ICD-10-CM

## 2022-03-11 DIAGNOSIS — Z800 Family history of malignant neoplasm of digestive organs: Secondary | ICD-10-CM | POA: Diagnosis not present

## 2022-03-11 DIAGNOSIS — K2100 Gastro-esophageal reflux disease with esophagitis, without bleeding: Secondary | ICD-10-CM | POA: Diagnosis not present

## 2022-03-11 DIAGNOSIS — R110 Nausea: Secondary | ICD-10-CM | POA: Diagnosis not present

## 2022-03-11 DIAGNOSIS — R490 Dysphonia: Secondary | ICD-10-CM

## 2022-03-11 DIAGNOSIS — R634 Abnormal weight loss: Secondary | ICD-10-CM | POA: Diagnosis not present

## 2022-03-11 DIAGNOSIS — R140 Abdominal distension (gaseous): Secondary | ICD-10-CM

## 2022-03-11 DIAGNOSIS — Z86010 Personal history of colonic polyps: Secondary | ICD-10-CM | POA: Diagnosis not present

## 2022-03-11 DIAGNOSIS — K5909 Other constipation: Secondary | ICD-10-CM | POA: Diagnosis not present

## 2022-03-12 ENCOUNTER — Ambulatory Visit
Admission: RE | Admit: 2022-03-12 | Discharge: 2022-03-12 | Disposition: A | Discharge status: Home / Self Care | Payer: Medicare HMO | Source: Ambulatory Visit | Attending: Gastroenterology | Admitting: Gastroenterology

## 2022-03-12 DIAGNOSIS — R1013 Epigastric pain: Secondary | ICD-10-CM | POA: Insufficient documentation

## 2022-03-12 DIAGNOSIS — R6339 Other feeding difficulties: Secondary | ICD-10-CM | POA: Diagnosis not present

## 2022-03-12 DIAGNOSIS — R140 Abdominal distension (gaseous): Secondary | ICD-10-CM | POA: Insufficient documentation

## 2022-03-12 DIAGNOSIS — R110 Nausea: Secondary | ICD-10-CM | POA: Diagnosis not present

## 2022-03-12 DIAGNOSIS — K2100 Gastro-esophageal reflux disease with esophagitis, without bleeding: Secondary | ICD-10-CM | POA: Diagnosis not present

## 2022-03-12 DIAGNOSIS — R490 Dysphonia: Secondary | ICD-10-CM | POA: Insufficient documentation

## 2022-03-12 DIAGNOSIS — K224 Dyskinesia of esophagus: Secondary | ICD-10-CM | POA: Diagnosis not present

## 2022-03-15 DIAGNOSIS — I38 Endocarditis, valve unspecified: Secondary | ICD-10-CM | POA: Diagnosis not present

## 2022-03-15 DIAGNOSIS — I2510 Atherosclerotic heart disease of native coronary artery without angina pectoris: Secondary | ICD-10-CM | POA: Diagnosis not present

## 2022-03-15 DIAGNOSIS — I480 Paroxysmal atrial fibrillation: Secondary | ICD-10-CM | POA: Diagnosis not present

## 2022-03-15 DIAGNOSIS — J3489 Other specified disorders of nose and nasal sinuses: Secondary | ICD-10-CM | POA: Diagnosis not present

## 2022-03-15 DIAGNOSIS — D649 Anemia, unspecified: Secondary | ICD-10-CM | POA: Diagnosis not present

## 2022-03-15 DIAGNOSIS — R7309 Other abnormal glucose: Secondary | ICD-10-CM | POA: Diagnosis not present

## 2022-03-15 DIAGNOSIS — I10 Essential (primary) hypertension: Secondary | ICD-10-CM | POA: Diagnosis not present

## 2022-03-15 DIAGNOSIS — R0982 Postnasal drip: Secondary | ICD-10-CM | POA: Diagnosis not present

## 2022-03-15 DIAGNOSIS — J0181 Other acute recurrent sinusitis: Secondary | ICD-10-CM | POA: Diagnosis not present

## 2022-03-16 DIAGNOSIS — Z96641 Presence of right artificial hip joint: Secondary | ICD-10-CM | POA: Diagnosis not present

## 2022-03-16 DIAGNOSIS — M25551 Pain in right hip: Secondary | ICD-10-CM | POA: Diagnosis not present

## 2022-03-17 DIAGNOSIS — K5900 Constipation, unspecified: Secondary | ICD-10-CM | POA: Diagnosis not present

## 2022-03-17 DIAGNOSIS — R634 Abnormal weight loss: Secondary | ICD-10-CM | POA: Diagnosis not present

## 2022-03-17 DIAGNOSIS — Z0000 Encounter for general adult medical examination without abnormal findings: Secondary | ICD-10-CM | POA: Diagnosis not present

## 2022-03-17 DIAGNOSIS — I10 Essential (primary) hypertension: Secondary | ICD-10-CM | POA: Diagnosis not present

## 2022-03-17 DIAGNOSIS — C73 Malignant neoplasm of thyroid gland: Secondary | ICD-10-CM | POA: Diagnosis not present

## 2022-03-17 DIAGNOSIS — R140 Abdominal distension (gaseous): Secondary | ICD-10-CM | POA: Diagnosis not present

## 2022-03-17 DIAGNOSIS — I2510 Atherosclerotic heart disease of native coronary artery without angina pectoris: Secondary | ICD-10-CM | POA: Diagnosis not present

## 2022-03-17 DIAGNOSIS — Z79899 Other long term (current) drug therapy: Secondary | ICD-10-CM | POA: Diagnosis not present

## 2022-03-18 DIAGNOSIS — Z96641 Presence of right artificial hip joint: Secondary | ICD-10-CM | POA: Diagnosis not present

## 2022-03-18 DIAGNOSIS — M25551 Pain in right hip: Secondary | ICD-10-CM | POA: Diagnosis not present

## 2022-03-22 DIAGNOSIS — Z96641 Presence of right artificial hip joint: Secondary | ICD-10-CM | POA: Diagnosis not present

## 2022-03-22 DIAGNOSIS — M25551 Pain in right hip: Secondary | ICD-10-CM | POA: Diagnosis not present

## 2022-03-24 DIAGNOSIS — I119 Hypertensive heart disease without heart failure: Secondary | ICD-10-CM | POA: Diagnosis not present

## 2022-03-24 DIAGNOSIS — I480 Paroxysmal atrial fibrillation: Secondary | ICD-10-CM | POA: Diagnosis not present

## 2022-03-24 DIAGNOSIS — R42 Dizziness and giddiness: Secondary | ICD-10-CM | POA: Diagnosis not present

## 2022-03-24 DIAGNOSIS — E782 Mixed hyperlipidemia: Secondary | ICD-10-CM | POA: Diagnosis not present

## 2022-03-24 DIAGNOSIS — Z955 Presence of coronary angioplasty implant and graft: Secondary | ICD-10-CM | POA: Diagnosis not present

## 2022-03-24 DIAGNOSIS — M25551 Pain in right hip: Secondary | ICD-10-CM | POA: Diagnosis not present

## 2022-03-24 DIAGNOSIS — Z96641 Presence of right artificial hip joint: Secondary | ICD-10-CM | POA: Diagnosis not present

## 2022-03-24 DIAGNOSIS — I2699 Other pulmonary embolism without acute cor pulmonale: Secondary | ICD-10-CM | POA: Diagnosis not present

## 2022-03-24 DIAGNOSIS — I2510 Atherosclerotic heart disease of native coronary artery without angina pectoris: Secondary | ICD-10-CM | POA: Diagnosis not present

## 2022-03-24 DIAGNOSIS — I10 Essential (primary) hypertension: Secondary | ICD-10-CM | POA: Diagnosis not present

## 2022-03-24 DIAGNOSIS — I6523 Occlusion and stenosis of bilateral carotid arteries: Secondary | ICD-10-CM | POA: Diagnosis not present

## 2022-03-25 DIAGNOSIS — R42 Dizziness and giddiness: Secondary | ICD-10-CM | POA: Diagnosis not present

## 2022-03-25 DIAGNOSIS — M5412 Radiculopathy, cervical region: Secondary | ICD-10-CM | POA: Diagnosis not present

## 2022-03-25 DIAGNOSIS — G629 Polyneuropathy, unspecified: Secondary | ICD-10-CM | POA: Diagnosis not present

## 2022-03-25 DIAGNOSIS — G959 Disease of spinal cord, unspecified: Secondary | ICD-10-CM | POA: Diagnosis not present

## 2022-03-25 DIAGNOSIS — R202 Paresthesia of skin: Secondary | ICD-10-CM | POA: Diagnosis not present

## 2022-03-25 DIAGNOSIS — G5621 Lesion of ulnar nerve, right upper limb: Secondary | ICD-10-CM | POA: Diagnosis not present

## 2022-03-25 DIAGNOSIS — G5603 Carpal tunnel syndrome, bilateral upper limbs: Secondary | ICD-10-CM | POA: Diagnosis not present

## 2022-03-25 DIAGNOSIS — I10 Essential (primary) hypertension: Secondary | ICD-10-CM | POA: Diagnosis not present

## 2022-03-26 ENCOUNTER — Ambulatory Visit: Admission: RE | Admit: 2022-03-26 | Payer: Medicare HMO | Source: Home / Self Care

## 2022-03-26 ENCOUNTER — Encounter: Admission: RE | Payer: Self-pay | Source: Home / Self Care

## 2022-03-26 SURGERY — COLONOSCOPY WITH PROPOFOL
Anesthesia: General

## 2022-03-30 DIAGNOSIS — M25551 Pain in right hip: Secondary | ICD-10-CM | POA: Diagnosis not present

## 2022-03-30 DIAGNOSIS — Z96641 Presence of right artificial hip joint: Secondary | ICD-10-CM | POA: Diagnosis not present

## 2022-04-01 ENCOUNTER — Other Ambulatory Visit: Payer: Self-pay

## 2022-04-01 ENCOUNTER — Emergency Department: Payer: Medicare HMO

## 2022-04-01 ENCOUNTER — Encounter: Payer: Self-pay | Admitting: Emergency Medicine

## 2022-04-01 ENCOUNTER — Emergency Department
Admission: EM | Admit: 2022-04-01 | Discharge: 2022-04-01 | Disposition: A | Discharge status: Home / Self Care | Payer: Medicare HMO | Attending: Emergency Medicine | Admitting: Emergency Medicine

## 2022-04-01 DIAGNOSIS — M5090 Cervical disc disorder, unspecified, unspecified cervical region: Secondary | ICD-10-CM | POA: Insufficient documentation

## 2022-04-01 DIAGNOSIS — M542 Cervicalgia: Secondary | ICD-10-CM | POA: Diagnosis present

## 2022-04-01 DIAGNOSIS — M549 Dorsalgia, unspecified: Secondary | ICD-10-CM | POA: Insufficient documentation

## 2022-04-01 DIAGNOSIS — R42 Dizziness and giddiness: Secondary | ICD-10-CM | POA: Diagnosis not present

## 2022-04-01 LAB — CBC WITH DIFFERENTIAL/PLATELET
Abs Immature Granulocytes: 0.05 10*3/uL (ref 0.00–0.07)
Basophils Absolute: 0 10*3/uL (ref 0.0–0.1)
Basophils Relative: 0 %
Eosinophils Absolute: 0.1 10*3/uL (ref 0.0–0.5)
Eosinophils Relative: 1 %
HCT: 38.6 % — ABNORMAL LOW (ref 39.0–52.0)
Hemoglobin: 12.4 g/dL — ABNORMAL LOW (ref 13.0–17.0)
Immature Granulocytes: 1 %
Lymphocytes Relative: 12 %
Lymphs Abs: 1 10*3/uL (ref 0.7–4.0)
MCH: 28.2 pg (ref 26.0–34.0)
MCHC: 32.1 g/dL (ref 30.0–36.0)
MCV: 87.7 fL (ref 80.0–100.0)
Monocytes Absolute: 1.1 10*3/uL — ABNORMAL HIGH (ref 0.1–1.0)
Monocytes Relative: 13 %
Neutro Abs: 6.4 10*3/uL (ref 1.7–7.7)
Neutrophils Relative %: 73 %
Platelets: 187 10*3/uL (ref 150–400)
RBC: 4.4 MIL/uL (ref 4.22–5.81)
RDW: 15.7 % — ABNORMAL HIGH (ref 11.5–15.5)
WBC: 8.6 10*3/uL (ref 4.0–10.5)
nRBC: 0 % (ref 0.0–0.2)

## 2022-04-01 LAB — BASIC METABOLIC PANEL
Anion gap: 6 (ref 5–15)
BUN: 23 mg/dL (ref 8–23)
CO2: 28 mmol/L (ref 22–32)
Calcium: 9.1 mg/dL (ref 8.9–10.3)
Chloride: 104 mmol/L (ref 98–111)
Creatinine, Ser: 1.35 mg/dL — ABNORMAL HIGH (ref 0.61–1.24)
GFR, Estimated: 54 mL/min — ABNORMAL LOW (ref >60)
Glucose, Bld: 90 mg/dL (ref 70–99)
Potassium: 3.9 mmol/L (ref 3.5–5.1)
Sodium: 138 mmol/L (ref 135–145)

## 2022-04-01 MED ORDER — GABAPENTIN 300 MG PO CAPS
300.0000 mg | ORAL_CAPSULE | Freq: Once | ORAL | Status: AC
  Administered 2022-04-01: 300 mg via ORAL
  Filled 2022-04-01: qty 1

## 2022-04-01 MED ORDER — HYDROMORPHONE HCL 1 MG/ML IJ SOLN
0.5000 mg | Freq: Once | INTRAMUSCULAR | Status: AC
  Administered 2022-04-01: 0.5 mg via INTRAVENOUS
  Filled 2022-04-01: qty 1

## 2022-04-01 MED ORDER — IOHEXOL 350 MG/ML SOLN
75.0000 mL | Freq: Once | INTRAVENOUS | Status: AC | PRN
  Administered 2022-04-01: 75 mL via INTRAVENOUS

## 2022-04-01 NOTE — ED Notes (Signed)
Pt reports dizzy spells after chiropractor appointment. Pt states it occurs while he is sitting and standing ?
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
## 2022-04-01 NOTE — Discharge Instructions (Signed)
Please continue to follow up with your outpatient doctors. Please seek medical attention for any high fevers, chest pain, shortness of breath, change in behavior, persistent vomiting, bloody stool or any other new or concerning symptoms. ? ?

## 2022-04-01 NOTE — ED Provider Notes (Signed)
? ?[facility] ?Provider Note ? ? ? Event Date/Time  ? First MD Initiated Contact with Patient 04/01/22 0710   ?  (approximate) ? ? ?History  ? ?Back Pain ? ? ?HPI ? ?Keith Williamson is a 76 y.o. male  who, per neurology note dated 03/25/2022 has history of diffuse cervical disc and facet degeneration, who presents to the emergency department today because of concern for neck, upper back pain going to the right arm and dizziness.  The patient has history of cervical spinal stenosis.  States over the past week it has been worse.  Particularly bad over the past 2 days.  Additionally patient has been complaining of dizziness since having a chiropractic work done on his neck.  The patient has seen specialist for his neck pain although has not contact them about the increasing pain. ? ?Physical Exam  ? ?Triage Vital Signs: ?ED Triage Vitals  ?Enc Vitals Group  ?   BP 04/01/22 0609 (!) 155/83  ?   Pulse Rate 04/01/22 0609 94  ?   Resp 04/01/22 0609 20  ?   Temp 04/01/22 0609 98.9 ?F (37.2 ?C)  ?   Temp Source 04/01/22 0609 Oral  ?   SpO2 04/01/22 0609 96 %  ?   Weight 04/01/22 0604 205 lb (93 kg)  ?   Height 04/01/22 0604 '6\' 2"'$  (1.88 m)  ?   Head Circumference --   ?   Peak Flow --   ?   Pain Score 04/01/22 0604 10  ? ?Most recent vital signs: ?Vitals:  ? 04/01/22 0609  ?BP: (!) 155/83  ?Pulse: 94  ?Resp: 20  ?Temp: 98.9 ?F (37.2 ?C)  ?SpO2: 96%  ? ? ?General: Awake, alert and oriented. ?CV:  Good peripheral perfusion. Regular rate and rhythm ?Resp:  Normal effort. Lungs clear ?Abd:  No distention.  ?MSK:  Right arm radial pulse 2+. No swelling or discoloration. ? ? ? ?ED Results / Procedures / Treatments  ? ?Labs ?(all labs ordered are listed, but only abnormal results are displayed) ?Labs Reviewed  ?CBC WITH DIFFERENTIAL/PLATELET - Abnormal; Notable for the following components:  ?    Result Value  ? Hemoglobin 12.4 (*)   ? HCT 38.6 (*)   ? RDW 15.7 (*)   ? Monocytes Absolute 1.1 (*)   ? All other  components within normal limits  ?BASIC METABOLIC PANEL - Abnormal; Notable for the following components:  ? Creatinine, Ser 1.35 (*)   ? GFR, Estimated 54 (*)   ? All other components within normal limits  ? ? ? ?EKG ? ?None ? ? ?RADIOLOGY ?I independently interpreted and visualized the CT angio neck. My interpretation: No dissection ?Radiology interpretation:  ?IMPRESSION:  ?1. Patent vertebral arteries without evidence of a dissection.  ?2. Mild-to-moderate right V2 stenosis due to extrinsic compression  ?from cervical spine spurring.  ?3. Mild cervical carotid artery atherosclerosis without stenosis.  ?4. Advanced cervical disc and facet degeneration.  ?5. Aortic Atherosclerosis (ICD10-I70.0).  ? ? ? ?PROCEDURES: ? ?Critical Care performed: No ? ?Procedures ? ? ?MEDICATIONS ORDERED IN ED: ?Medications - No data to display ? ? ?IMPRESSION / MDM / ASSESSMENT AND PLAN / ED COURSE  ?I reviewed the triage vital signs and the nursing notes. ?             ?               ? ?Differential diagnosis includes, but is not limited to,  worsening cervical radiculopathy, vertebral artery dissection. ? ?Patient presents to the emergency department today with primary concern for continued and worsening neck pain with some radiation to the right arm.  Patient states he recently did have chiropractic manipulation.  Blood work today without concerning leukocytosis or electrolyte abnormality.  Did obtain CT angio of the neck to evaluate for vertebral artery injury given recent manipulation.  Fortunately this did not show any acute findings although did show evidence of known chronic cervical spine disease.  At this time I do think that is likely what is causing the patient's symptoms.  Discussed with patient importance of follow-up with outpatient physicians that have seen patient for this problem in the past. ? ?FINAL CLINICAL IMPRESSION(S) / ED DIAGNOSES  ? ?Final diagnoses:  ?Cervical neck pain with evidence of disc disease   ? ? ? ?Note:  This document was prepared using Dragon voice recognition software and may include unintentional dictation errors. ? ?  ?Nance Pear, MD ?04/01/22 1103 ? ?

## 2022-04-01 NOTE — ED Notes (Signed)
Dc instructions reviewed with pt and wife. Will follow up with ent and pcp. No other questions at this time.  ?

## 2022-04-02 DIAGNOSIS — M25551 Pain in right hip: Secondary | ICD-10-CM | POA: Diagnosis not present

## 2022-04-02 DIAGNOSIS — Z96641 Presence of right artificial hip joint: Secondary | ICD-10-CM | POA: Diagnosis not present

## 2022-04-05 DIAGNOSIS — G8929 Other chronic pain: Secondary | ICD-10-CM | POA: Diagnosis not present

## 2022-04-05 DIAGNOSIS — I10 Essential (primary) hypertension: Secondary | ICD-10-CM | POA: Diagnosis not present

## 2022-04-05 DIAGNOSIS — M1712 Unilateral primary osteoarthritis, left knee: Secondary | ICD-10-CM | POA: Diagnosis not present

## 2022-04-05 DIAGNOSIS — M5412 Radiculopathy, cervical region: Secondary | ICD-10-CM | POA: Diagnosis not present

## 2022-04-05 DIAGNOSIS — G959 Disease of spinal cord, unspecified: Secondary | ICD-10-CM | POA: Diagnosis not present

## 2022-04-05 DIAGNOSIS — M25562 Pain in left knee: Secondary | ICD-10-CM | POA: Diagnosis not present

## 2022-04-06 DIAGNOSIS — R42 Dizziness and giddiness: Secondary | ICD-10-CM | POA: Diagnosis not present

## 2022-04-06 DIAGNOSIS — I6523 Occlusion and stenosis of bilateral carotid arteries: Secondary | ICD-10-CM | POA: Diagnosis not present

## 2022-04-06 DIAGNOSIS — I10 Essential (primary) hypertension: Secondary | ICD-10-CM | POA: Diagnosis not present

## 2022-04-07 DIAGNOSIS — M5030 Other cervical disc degeneration, unspecified cervical region: Secondary | ICD-10-CM | POA: Diagnosis not present

## 2022-04-07 DIAGNOSIS — M4722 Other spondylosis with radiculopathy, cervical region: Secondary | ICD-10-CM | POA: Diagnosis not present

## 2022-04-07 DIAGNOSIS — R200 Anesthesia of skin: Secondary | ICD-10-CM | POA: Diagnosis not present

## 2022-04-07 DIAGNOSIS — R202 Paresthesia of skin: Secondary | ICD-10-CM | POA: Diagnosis not present

## 2022-04-07 DIAGNOSIS — Z96641 Presence of right artificial hip joint: Secondary | ICD-10-CM | POA: Diagnosis not present

## 2022-04-07 DIAGNOSIS — M5412 Radiculopathy, cervical region: Secondary | ICD-10-CM | POA: Diagnosis not present

## 2022-04-14 DIAGNOSIS — M25551 Pain in right hip: Secondary | ICD-10-CM | POA: Diagnosis not present

## 2022-04-14 DIAGNOSIS — Z96641 Presence of right artificial hip joint: Secondary | ICD-10-CM | POA: Diagnosis not present

## 2022-04-15 ENCOUNTER — Emergency Department: Payer: Medicare HMO

## 2022-04-15 ENCOUNTER — Other Ambulatory Visit: Payer: Self-pay

## 2022-04-15 ENCOUNTER — Observation Stay
Admission: EM | Admit: 2022-04-15 | Discharge: 2022-04-16 | Disposition: A | Discharge status: Home / Self Care | Payer: Medicare HMO | Attending: Internal Medicine | Admitting: Internal Medicine

## 2022-04-15 DIAGNOSIS — E890 Postprocedural hypothyroidism: Secondary | ICD-10-CM | POA: Insufficient documentation

## 2022-04-15 DIAGNOSIS — K922 Gastrointestinal hemorrhage, unspecified: Secondary | ICD-10-CM

## 2022-04-15 DIAGNOSIS — Z86711 Personal history of pulmonary embolism: Secondary | ICD-10-CM | POA: Insufficient documentation

## 2022-04-15 DIAGNOSIS — Z87891 Personal history of nicotine dependence: Secondary | ICD-10-CM | POA: Diagnosis not present

## 2022-04-15 DIAGNOSIS — K921 Melena: Secondary | ICD-10-CM

## 2022-04-15 DIAGNOSIS — B9681 Helicobacter pylori [H. pylori] as the cause of diseases classified elsewhere: Secondary | ICD-10-CM | POA: Diagnosis not present

## 2022-04-15 DIAGNOSIS — R1010 Upper abdominal pain, unspecified: Secondary | ICD-10-CM

## 2022-04-15 DIAGNOSIS — K317 Polyp of stomach and duodenum: Principal | ICD-10-CM | POA: Insufficient documentation

## 2022-04-15 DIAGNOSIS — L8990 Pressure ulcer of unspecified site, unspecified stage: Secondary | ICD-10-CM | POA: Insufficient documentation

## 2022-04-15 DIAGNOSIS — I480 Paroxysmal atrial fibrillation: Secondary | ICD-10-CM | POA: Diagnosis not present

## 2022-04-15 DIAGNOSIS — Z7901 Long term (current) use of anticoagulants: Secondary | ICD-10-CM | POA: Diagnosis not present

## 2022-04-15 DIAGNOSIS — K2950 Unspecified chronic gastritis without bleeding: Secondary | ICD-10-CM | POA: Insufficient documentation

## 2022-04-15 DIAGNOSIS — Z85850 Personal history of malignant neoplasm of thyroid: Secondary | ICD-10-CM | POA: Insufficient documentation

## 2022-04-15 DIAGNOSIS — R0602 Shortness of breath: Secondary | ICD-10-CM | POA: Diagnosis not present

## 2022-04-15 DIAGNOSIS — I252 Old myocardial infarction: Secondary | ICD-10-CM | POA: Diagnosis not present

## 2022-04-15 DIAGNOSIS — K219 Gastro-esophageal reflux disease without esophagitis: Secondary | ICD-10-CM | POA: Insufficient documentation

## 2022-04-15 DIAGNOSIS — K449 Diaphragmatic hernia without obstruction or gangrene: Secondary | ICD-10-CM | POA: Diagnosis not present

## 2022-04-15 DIAGNOSIS — K3189 Other diseases of stomach and duodenum: Secondary | ICD-10-CM | POA: Diagnosis not present

## 2022-04-15 DIAGNOSIS — I2510 Atherosclerotic heart disease of native coronary artery without angina pectoris: Secondary | ICD-10-CM | POA: Insufficient documentation

## 2022-04-15 DIAGNOSIS — Z9660 Presence of unspecified orthopedic joint implant: Secondary | ICD-10-CM | POA: Insufficient documentation

## 2022-04-15 DIAGNOSIS — R42 Dizziness and giddiness: Secondary | ICD-10-CM

## 2022-04-15 DIAGNOSIS — Z86718 Personal history of other venous thrombosis and embolism: Secondary | ICD-10-CM | POA: Insufficient documentation

## 2022-04-15 DIAGNOSIS — Z79899 Other long term (current) drug therapy: Secondary | ICD-10-CM | POA: Insufficient documentation

## 2022-04-15 DIAGNOSIS — I951 Orthostatic hypotension: Secondary | ICD-10-CM | POA: Diagnosis not present

## 2022-04-15 DIAGNOSIS — R1013 Epigastric pain: Secondary | ICD-10-CM | POA: Diagnosis not present

## 2022-04-15 DIAGNOSIS — I10 Essential (primary) hypertension: Secondary | ICD-10-CM | POA: Diagnosis present

## 2022-04-15 DIAGNOSIS — E039 Hypothyroidism, unspecified: Secondary | ICD-10-CM

## 2022-04-15 DIAGNOSIS — E785 Hyperlipidemia, unspecified: Secondary | ICD-10-CM

## 2022-04-15 DIAGNOSIS — K2980 Duodenitis without bleeding: Secondary | ICD-10-CM | POA: Diagnosis not present

## 2022-04-15 DIAGNOSIS — Z7982 Long term (current) use of aspirin: Secondary | ICD-10-CM | POA: Diagnosis not present

## 2022-04-15 DIAGNOSIS — N400 Enlarged prostate without lower urinary tract symptoms: Secondary | ICD-10-CM

## 2022-04-15 LAB — BASIC METABOLIC PANEL
Anion gap: 5 (ref 5–15)
BUN: 25 mg/dL — ABNORMAL HIGH (ref 8–23)
CO2: 29 mmol/L (ref 22–32)
Calcium: 9.5 mg/dL (ref 8.9–10.3)
Chloride: 107 mmol/L (ref 98–111)
Creatinine, Ser: 1.25 mg/dL — ABNORMAL HIGH (ref 0.61–1.24)
GFR, Estimated: 59 mL/min — ABNORMAL LOW (ref >60)
Glucose, Bld: 94 mg/dL (ref 70–99)
Potassium: 4.6 mmol/L (ref 3.5–5.1)
Sodium: 141 mmol/L (ref 135–145)

## 2022-04-15 LAB — URINALYSIS, ROUTINE W REFLEX MICROSCOPIC
Bilirubin Urine: NEGATIVE
Glucose, UA: NEGATIVE mg/dL
Hgb urine dipstick: NEGATIVE
Ketones, ur: NEGATIVE mg/dL
Leukocytes,Ua: NEGATIVE
Nitrite: NEGATIVE
Protein, ur: NEGATIVE mg/dL
Specific Gravity, Urine: 1.024 (ref 1.005–1.030)
pH: 6 (ref 5.0–8.0)

## 2022-04-15 LAB — CBC
HCT: 41.1 % (ref 39.0–52.0)
Hemoglobin: 13 g/dL (ref 13.0–17.0)
MCH: 28 pg (ref 26.0–34.0)
MCHC: 31.6 g/dL (ref 30.0–36.0)
MCV: 88.6 fL (ref 80.0–100.0)
Platelets: 300 10*3/uL (ref 150–400)
RBC: 4.64 MIL/uL (ref 4.22–5.81)
RDW: 15.5 % (ref 11.5–15.5)
WBC: 6.5 10*3/uL (ref 4.0–10.5)
nRBC: 0 % (ref 0.0–0.2)

## 2022-04-15 LAB — LIPASE, BLOOD: Lipase: 40 U/L (ref 11–51)

## 2022-04-15 LAB — TROPONIN I (HIGH SENSITIVITY): Troponin I (High Sensitivity): 2 ng/L (ref <18)

## 2022-04-15 LAB — APTT: aPTT: 33 seconds (ref 24–36)

## 2022-04-15 MED ORDER — ALUM & MAG HYDROXIDE-SIMETH 200-200-20 MG/5ML PO SUSP
30.0000 mL | Freq: Once | ORAL | Status: AC
  Administered 2022-04-15: 30 mL via ORAL
  Filled 2022-04-15: qty 30

## 2022-04-15 NOTE — ED Triage Notes (Signed)
Pt here with dizziness and blood in stool that started last night. Pt wife states that stool was black and tarry with a foul odor. Pt has a hx og GI issues currently. Pt was scheduled for an endo and colonoscopy on 04/25/22. Pt states he is having nausea at this moment. ?

## 2022-04-15 NOTE — ED Notes (Signed)
Pt asking about his wait time. Pt informed that there are currently 10 people that have been waiting longer than him, however; if someone comes in that is sicker than him than they will be seen sooner than him. Pt asking he should just go home and [place] call him with his lab. Pt informed that we cannot advise him of what he needs to do. Pt asking if labs are back, pt informed that they are but we are not able to give him the results as that has to come from the doctor. Pt states that he has a total hip replacement and he is hard for him to sit still. Pt informed that he is more than welcome to walk around in the lobby, but we cannot allow him to walk around in the back in the patient care area. Pt states that he has been here all day and has not ate anything. Pt was informed that we can get him something to eat and drink. Pt was given a sandwich tray and water.  ?

## 2022-04-15 NOTE — ED Provider Notes (Signed)
? ?[facility] ?Provider Note ? ? ? Event Date/Time  ? First MD Initiated Contact with Patient 04/15/22 2304   ?  (approximate) ? ? ?History  ? ?Dizziness and GI Problem ? ? ?HPI ? ?Keith Williamson is a 76 y.o. male with history of CAD, hypertension, hyperlipidemia, hypothyroidism who presents to the emergency department his wife for complaints of 2 episodes of melena that occurred yesterday.  He has not had any symptoms of GI bleed in the past 24 hours.  No hematemesis, hematochezia.  Denies any current abdominal pain.  Wife reports he complains of upper abdominal pain and acid reflux after eating.  No fevers, cough.  States he has had intermittent shortness of breath but no chest pain or chest discomfort.  Wife reports that they were supposed to have an endoscopy and colonoscopy on April 14 but canceled this due to chronic dizziness.  He states that he will feel lightheaded and like the room is moving.  This has been ongoing for over a month.  No near syncope.  No numbness, tingling or focal weakness.  Wife reports he is supposed to be on Eliquis for history of DVT but is no longer taking this medication.  Wife does report that he takes Pepto-Bismol and Mylanta regularly for heartburn but she does not think he has had any Pepto-Bismol recently.  She states his stool has been very foul-smelling like a GI bleed.  Wife is a Marine scientist. ? ? ?History provided by patient and wife. ? ? ? ?Past Medical History:  ?Diagnosis Date  ? Arthritis   ? BPH (benign prostatic hypertrophy)   ? CAD (coronary artery disease)   ? Diverticulosis   ? Enlarged prostate   ? GERD (gastroesophageal reflux disease)   ? Headache(784.0)   ? frequent sinus headache  ? Hurthle cell tumor   ? Hyperlipidemia   ? Hypertension   ? Hypothyroidism   ? MI (myocardial infarction) ([place]) 2014  ? inferior; s/p PCI with stent placement to RCA  ? OA (osteoarthritis)   ? Retina hole   ? Senile nuclear sclerosis   ? Thyroid cancer ([place])   ?  iodine tx  ? Valvular heart disease   ? ? ?Past Surgical History:  ?Procedure Laterality Date  ? ANGIOPLASTY / STENTING FEMORAL    ? BACK SURGERY    ? COLONOSCOPY WITH PROPOFOL N/A 05/07/2016  ? Procedure: COLONOSCOPY WITH PROPOFOL;  Surgeon: Lollie Sails, MD;  Location: [facility] ENDOSCOPY;  Service: Endoscopy;  Laterality: N/A;  ? ESOPHAGOGASTRODUODENOSCOPY (EGD) WITH PROPOFOL N/A 05/07/2016  ? Procedure: ESOPHAGOGASTRODUODENOSCOPY (EGD) WITH PROPOFOL;  Surgeon: Lollie Sails, MD;  Location: [facility] ENDOSCOPY;  Service: Endoscopy;  Laterality: N/A;  ? GREEN LIGHT LASER TURP (TRANSURETHRAL RESECTION OF PROSTATE N/A 01/01/2021  ? Procedure: GREEN LIGHT LASER TURP (TRANSURETHRAL RESECTION OF PROSTATE;  Surgeon: Royston Cowper, MD;  Location: [facility] ORS;  Service: Urology;  Laterality: N/A;  ? KNEE ARTHROSCOPY Left   ? Medial and lateral meniscectomy, partial  ? LUMBAR LAMINECTOMY  08/16/2012  ? L 3 4 & 5  ? LUMBAR MICRODISCECTOMY    ? L3-L4; with partial hemilaminectomy, partial facetectomy and foraminotomy  ? TOTAL THYROIDECTOMY    ? TURP VAPORIZATION    ? ? ?MEDICATIONS:  ?Prior to Admission medications   ?Medication Sig Start Date End Date Taking? Authorizing Provider  ?allopurinol (ZYLOPRIM) 100 MG tablet Take 100 mg by mouth daily.    [provider]  ?apixaban (ELIQUIS) 5 MG TABS  tablet Take 2 tablets (10 mg total) by mouth 2 (two) times daily for 5 days, THEN 1 tablet (5 mg total) 2 (two) times daily. 08/08/21 09/12/21  Nicole Kindred A, DO  ?aspirin 81 MG tablet Take 81 mg by mouth daily. ?Patient not taking: No sig reported    [provider]  ?BACLOFEN PO Take by mouth.    [provider]  ?candesartan (ATACAND) 32 MG tablet Take 32 mg by mouth daily.    [provider]  ?docusate sodium (COLACE) 100 MG capsule Take 2 capsules (200 mg total) by mouth 2 (two) times daily. 01/01/21   Royston Cowper, MD  ?dutasteride (AVODART) 0.5 MG capsule Take 0.5 mg by mouth daily.     [provider]  ?fluticasone (FLONASE) 50 MCG/ACT nasal spray Place 2 sprays into both nostrils daily.    [provider]  ?GI Cocktail (alum & mag hydroxide-simethicone/lidocaine)oral mixture Take 30 mLs by mouth 2 (two) times daily as needed (epigastric pain/burning). Each does to contain 72m Alum/mag Hydroxide and 134mLidocaine 08/29/21   PaHarvest DarkMD  ?levothyroxine (SYNTHROID, LEVOTHROID) 137 MCG tablet Take 137 mcg by mouth daily before breakfast.    [provider]  ?Multiple Vitamins-Minerals (MULTIVITAMIN WITH MINERALS) tablet Take 1 tablet by mouth daily.    [provider]  ?nitroGLYCERIN (NITROSTAT) 0.4 MG SL tablet Place 0.4 mg under the tongue every 5 (five) minutes as needed for chest pain.    [provider]  ?pantoprazole (PROTONIX) 40 MG tablet Take 1 tablet (40 mg total) by mouth daily. 08/29/21 08/29/22  PaHarvest DarkMD  ?pregabalin (LYRICA) 50 MG capsule Take 50 mg by mouth 2 (two) times daily. 07/10/21   [provider]  ?rosuvastatin (CRESTOR) 40 MG tablet Take 1 tablet (40 mg total) by mouth at bedtime. 08/08/21   GrEzekiel SlocumbDO  ? ? ?Physical Exam  ? ?Triage Vital Signs: ?ED Triage Vitals  ?Enc Vitals Group  ?   BP 04/15/22 1413 (!) 170/99  ?   Pulse Rate 04/15/22 1413 77  ?   Resp 04/15/22 1413 18  ?   Temp 04/15/22 1413 98.5 ?F (36.9 ?C)  ?   Temp Source 04/15/22 1413 Oral  ?   SpO2 04/15/22 1413 100 %  ?   Weight 04/15/22 1414 200 lb (90.7 kg)  ?   Height 04/15/22 1414 '6\' 2"'$  (1.88 m)  ?   Head Circumference --   ?   Peak Flow --   ?   Pain Score 04/15/22 1414 7  ?   Pain Loc --   ?   Pain Edu? --   ?   Excl. in [place]   ? ? ?Most recent vital signs: ?Vitals:  ? 04/16/22 0200 04/16/22 0230  ?BP: (!) 180/104 (!) 165/97  ?Pulse: 69 71  ?Resp: 18 18  ?Temp:    ?SpO2: 100% 100%  ? ? ?CONSTITUTIONAL: Alert and oriented and responds appropriately to questions. Well-appearing; well-nourished, elderly ?HEAD: Normocephalic,  atraumatic ?EYES: Conjunctivae clear, pupils appear equal, sclera nonicteric ?ENT: normal nose; moist mucous membranes ?NECK: Supple, normal ROM ?CARD: RRR; S1 and S2 appreciated; no murmurs, no clicks, no rubs, no gallops ?RESP: Normal chest excursion without splinting or tachypnea; breath sounds clear and equal bilaterally; no wheezes, no rhonchi, no rales, no hypoxia or respiratory distress, speaking full sentences ?ABD/GI: Normal bowel sounds; non-distended; soft, non-tender, no rebound, no guarding, no peritoneal signs ?RECTAL:  Normal rectal tone, no  gross blood or melena, guaiac NEGATIVE, no hemorrhoids appreciated, nontender rectal exam, no fecal impaction. Chaperone present. ?BACK: The back appears normal ?EXT: Normal ROM in all joints; no deformity noted, no edema; no cyanosis, no calf tenderness or calf swelling ?SKIN: Normal color for age and race; warm; no rash on exposed skin ?NEURO: Moves all extremities equally, normal speech ?PSYCH: The patient's mood and manner are appropriate. ? ? ?ED Results / Procedures / Treatments  ? ?LABS: ?(all labs ordered are listed, but only abnormal results are displayed) ?Labs Reviewed  ?BASIC METABOLIC PANEL - Abnormal; Notable for the following components:  ?    Result Value  ? BUN 25 (*)   ? Creatinine, Ser 1.25 (*)   ? GFR, Estimated 59 (*)   ? All other components within normal limits  ?URINALYSIS, ROUTINE W REFLEX MICROSCOPIC - Abnormal; Notable for the following components:  ? Color, Urine YELLOW (*)   ? APPearance CLEAR (*)   ? All other components within normal limits  ?TSH - Abnormal; Notable for the following components:  ? TSH 0.247 (*)   ? All other components within normal limits  ?T4, FREE - Abnormal; Notable for the following components:  ? Free T4 1.20 (*)   ? All other components within normal limits  ?CBC  ?LIPASE, BLOOD  ?APTT  ?CBG MONITORING, ED  ?TROPONIN I (HIGH SENSITIVITY)  ?TROPONIN I (HIGH SENSITIVITY)  ? ? ? ?EKG: ? EKG  Interpretation ? ?Date/Time:  Thursday Apr 15 2022 14:17:56 EDT ?Ventricular Rate:  74 ?PR Interval:  208 ?QRS Duration: 108 ?QT Interval:  392 ?QTC Calculation: 435 ?R Axis:   -69 ?Text Interpretation: Normal sinus rhythm Incom

## 2022-04-16 ENCOUNTER — Encounter: Payer: Self-pay | Admitting: Family Medicine

## 2022-04-16 ENCOUNTER — Observation Stay: Payer: Medicare HMO | Admitting: Anesthesiology

## 2022-04-16 ENCOUNTER — Encounter
Admission: EM | Disposition: A | Discharge status: Home / Self Care | Payer: Self-pay | Source: Home / Self Care | Attending: Emergency Medicine

## 2022-04-16 DIAGNOSIS — I480 Paroxysmal atrial fibrillation: Secondary | ICD-10-CM

## 2022-04-16 DIAGNOSIS — K317 Polyp of stomach and duodenum: Secondary | ICD-10-CM | POA: Diagnosis not present

## 2022-04-16 DIAGNOSIS — K449 Diaphragmatic hernia without obstruction or gangrene: Secondary | ICD-10-CM | POA: Diagnosis not present

## 2022-04-16 DIAGNOSIS — L8990 Pressure ulcer of unspecified site, unspecified stage: Secondary | ICD-10-CM | POA: Insufficient documentation

## 2022-04-16 DIAGNOSIS — B9681 Helicobacter pylori [H. pylori] as the cause of diseases classified elsewhere: Secondary | ICD-10-CM | POA: Diagnosis not present

## 2022-04-16 DIAGNOSIS — E785 Hyperlipidemia, unspecified: Secondary | ICD-10-CM | POA: Diagnosis not present

## 2022-04-16 DIAGNOSIS — E039 Hypothyroidism, unspecified: Secondary | ICD-10-CM | POA: Diagnosis not present

## 2022-04-16 DIAGNOSIS — I951 Orthostatic hypotension: Secondary | ICD-10-CM | POA: Diagnosis present

## 2022-04-16 DIAGNOSIS — I10 Essential (primary) hypertension: Secondary | ICD-10-CM | POA: Diagnosis not present

## 2022-04-16 DIAGNOSIS — K921 Melena: Secondary | ICD-10-CM | POA: Diagnosis not present

## 2022-04-16 DIAGNOSIS — Q102 Congenital entropion: Secondary | ICD-10-CM | POA: Diagnosis not present

## 2022-04-16 DIAGNOSIS — N400 Enlarged prostate without lower urinary tract symptoms: Secondary | ICD-10-CM

## 2022-04-16 DIAGNOSIS — R1010 Upper abdominal pain, unspecified: Secondary | ICD-10-CM

## 2022-04-16 DIAGNOSIS — K3189 Other diseases of stomach and duodenum: Secondary | ICD-10-CM | POA: Diagnosis not present

## 2022-04-16 DIAGNOSIS — K2950 Unspecified chronic gastritis without bleeding: Secondary | ICD-10-CM | POA: Diagnosis not present

## 2022-04-16 HISTORY — PX: ESOPHAGOGASTRODUODENOSCOPY (EGD) WITH PROPOFOL: SHX5813

## 2022-04-16 LAB — IRON AND TIBC
Iron: 46 ug/dL (ref 45–182)
Saturation Ratios: 16 % — ABNORMAL LOW (ref 17.9–39.5)
TIBC: 281 ug/dL (ref 250–450)
UIBC: 235 ug/dL

## 2022-04-16 LAB — BASIC METABOLIC PANEL
Anion gap: 8 (ref 5–15)
BUN: 20 mg/dL (ref 8–23)
CO2: 27 mmol/L (ref 22–32)
Calcium: 9 mg/dL (ref 8.9–10.3)
Chloride: 105 mmol/L (ref 98–111)
Creatinine, Ser: 1.06 mg/dL (ref 0.61–1.24)
GFR, Estimated: >60 mL/min (ref >60)
Glucose, Bld: 82 mg/dL (ref 70–99)
Potassium: 3.7 mmol/L (ref 3.5–5.1)
Sodium: 140 mmol/L (ref 135–145)

## 2022-04-16 LAB — CBC
HCT: 38.9 % — ABNORMAL LOW (ref 39.0–52.0)
Hemoglobin: 12.3 g/dL — ABNORMAL LOW (ref 13.0–17.0)
MCH: 27.8 pg (ref 26.0–34.0)
MCHC: 31.6 g/dL (ref 30.0–36.0)
MCV: 87.8 fL (ref 80.0–100.0)
Platelets: 304 10*3/uL (ref 150–400)
RBC: 4.43 MIL/uL (ref 4.22–5.81)
RDW: 15.2 % (ref 11.5–15.5)
WBC: 6.5 10*3/uL (ref 4.0–10.5)
nRBC: 0 % (ref 0.0–0.2)

## 2022-04-16 LAB — FERRITIN: Ferritin: 47 ng/mL (ref 24–336)

## 2022-04-16 LAB — VITAMIN B12: Vitamin B-12: 1058 pg/mL — ABNORMAL HIGH (ref 180–914)

## 2022-04-16 LAB — TSH: TSH: 0.247 u[IU]/mL — ABNORMAL LOW (ref 0.350–4.500)

## 2022-04-16 LAB — TROPONIN I (HIGH SENSITIVITY): Troponin I (High Sensitivity): 4 ng/L (ref <18)

## 2022-04-16 LAB — T4, FREE: Free T4: 1.2 ng/dL — ABNORMAL HIGH (ref 0.61–1.12)

## 2022-04-16 SURGERY — ESOPHAGOGASTRODUODENOSCOPY (EGD) WITH PROPOFOL
Anesthesia: General

## 2022-04-16 MED ORDER — FOLIC ACID 1 MG PO TABS
1.0000 mg | ORAL_TABLET | Freq: Every day | ORAL | Status: DC
  Administered 2022-04-16: 1 mg via ORAL

## 2022-04-16 MED ORDER — ROSUVASTATIN CALCIUM 10 MG PO TABS: 40.0000 mg | ORAL_TABLET | Freq: Every day | ORAL | Status: DC

## 2022-04-16 MED ORDER — ALLOPURINOL 100 MG PO TABS
100.0000 mg | ORAL_TABLET | Freq: Every day | ORAL | Status: DC
  Administered 2022-04-16: 100 mg via ORAL
  Filled 2022-04-16: qty 1

## 2022-04-16 MED ORDER — SODIUM CHLORIDE 0.9 % IV BOLUS (SEPSIS)
1000.0000 mL | Freq: Once | INTRAVENOUS | Status: AC
  Administered 2022-04-16: 1000 mL via INTRAVENOUS

## 2022-04-16 MED ORDER — COLCHICINE 0.6 MG PO TABS: 0.6000 mg | ORAL_TABLET | ORAL | Status: DC | PRN

## 2022-04-16 MED ORDER — ONDANSETRON HCL 4 MG PO TABS: 4.0000 mg | ORAL_TABLET | Freq: Four times a day (QID) | ORAL | Status: DC | PRN

## 2022-04-16 MED ORDER — TRAZODONE HCL 50 MG PO TABS: 25.0000 mg | ORAL_TABLET | Freq: Every evening | ORAL | Status: DC | PRN

## 2022-04-16 MED ORDER — HYDROCODONE-ACETAMINOPHEN 10-325 MG PO TABS
1.0000 | ORAL_TABLET | Freq: Four times a day (QID) | ORAL | Status: DC | PRN
Start: 2022-04-16 — End: 2022-04-16
  Administered 2022-04-16: 1 via ORAL
  Filled 2022-04-16: qty 1

## 2022-04-16 MED ORDER — SODIUM CHLORIDE 0.9 % IV SOLN: INTRAVENOUS | Status: DC

## 2022-04-16 MED ORDER — NITROGLYCERIN 0.4 MG SL SUBL: 0.4000 mg | SUBLINGUAL_TABLET | SUBLINGUAL | Status: DC | PRN

## 2022-04-16 MED ORDER — ONDANSETRON HCL 4 MG/2ML IJ SOLN: 4.0000 mg | Freq: Four times a day (QID) | INTRAMUSCULAR | Status: DC | PRN

## 2022-04-16 MED ORDER — DOCUSATE SODIUM 100 MG PO CAPS
200.0000 mg | ORAL_CAPSULE | Freq: Two times a day (BID) | ORAL | Status: DC
Start: 2022-04-16 — End: 2022-04-16
  Administered 2022-04-16: 200 mg via ORAL
  Filled 2022-04-16: qty 2

## 2022-04-16 MED ORDER — FLUTICASONE PROPIONATE 50 MCG/ACT NA SUSP
2.0000 | Freq: Every day | NASAL | Status: DC | PRN
  Filled 2022-04-16: qty 16

## 2022-04-16 MED ORDER — PANTOPRAZOLE SODIUM 40 MG IV SOLR
40.0000 mg | Freq: Two times a day (BID) | INTRAVENOUS | Status: DC
  Administered 2022-04-16: 40 mg via INTRAVENOUS
  Filled 2022-04-16: qty 10

## 2022-04-16 MED ORDER — PROPOFOL 10 MG/ML IV BOLUS
INTRAVENOUS | Status: DC | PRN
  Administered 2022-04-16 (×2): 50 mg via INTRAVENOUS

## 2022-04-16 MED ORDER — IRBESARTAN 75 MG PO TABS
37.5000 mg | ORAL_TABLET | Freq: Every day | ORAL | Status: DC
  Administered 2022-04-16: 37.5 mg via ORAL
  Filled 2022-04-16: qty 0.5

## 2022-04-16 MED ORDER — LEVOTHYROXINE SODIUM 137 MCG PO TABS
137.0000 ug | ORAL_TABLET | Freq: Every day | ORAL | Status: DC
  Administered 2022-04-16: 137 ug via ORAL
  Filled 2022-04-16: qty 1

## 2022-04-16 MED ORDER — ADULT MULTIVITAMIN W/MINERALS CH
1.0000 | ORAL_TABLET | Freq: Every day | ORAL | Status: DC
  Administered 2022-04-16: 1 via ORAL
  Filled 2022-04-16: qty 1

## 2022-04-16 MED ORDER — PREGABALIN 50 MG PO CAPS
50.0000 mg | ORAL_CAPSULE | Freq: Two times a day (BID) | ORAL | Status: DC
Start: 2022-04-16 — End: 2022-04-16
  Administered 2022-04-16: 50 mg via ORAL
  Filled 2022-04-16: qty 1

## 2022-04-16 MED ORDER — ACETAMINOPHEN 650 MG RE SUPP: 650.0000 mg | Freq: Four times a day (QID) | RECTAL | Status: DC | PRN

## 2022-04-16 MED ORDER — DUTASTERIDE 0.5 MG PO CAPS
0.5000 mg | ORAL_CAPSULE | Freq: Every day | ORAL | Status: DC
  Administered 2022-04-16: 0.5 mg via ORAL
  Filled 2022-04-16: qty 1

## 2022-04-16 MED ORDER — PANTOPRAZOLE SODIUM 40 MG IV SOLR
40.0000 mg | Freq: Once | INTRAVENOUS | Status: AC
Start: 2022-04-16 — End: 2022-04-16
  Administered 2022-04-16: 40 mg via INTRAVENOUS
  Filled 2022-04-16: qty 10

## 2022-04-16 MED ORDER — ACETAMINOPHEN 325 MG PO TABS: 650.0000 mg | ORAL_TABLET | Freq: Four times a day (QID) | ORAL | Status: DC | PRN

## 2022-04-16 MED ORDER — LIDOCAINE HCL (CARDIAC) PF 100 MG/5ML IV SOSY
PREFILLED_SYRINGE | INTRAVENOUS | Status: DC | PRN
  Administered 2022-04-16: 50 mg via INTRAVENOUS

## 2022-04-16 NOTE — Op Note (Signed)
[facility] ?Gastroenterology ?Patient Name: Keith Williamson ?Procedure Date: 04/16/2022 1:57 PM ?MRN: [id number] ?Account #: [id number] ?Date of Birth: 1946/06/25 ?Admit Type: Inpatient ?Age: 76 ?Room: [facility] ENDO ROOM 2 ?Gender: Male ?Note Status: Finalized ?Instrument Name: Upper Endoscope [id number] ?Procedure:             Upper GI endoscopy ?Indications:           Melena ?Providers:             Lin Landsman MD, MD ?Medicines:             General Anesthesia ?Complications:         No immediate complications. Estimated blood loss: None. ?Procedure:             Pre-Anesthesia Assessment: ?                       - Prior to the procedure, a History and Physical was  ?                       performed, and patient medications and allergies were  ?                       reviewed. The patient is competent. The risks and  ?                       benefits of the procedure and the sedation options and  ?                       risks were discussed with the patient. All questions  ?                       were answered and informed consent was obtained.  ?                       Patient identification and proposed procedure were  ?                       verified by the physician, the nurse, the  ?                       anesthesiologist, the anesthetist and the technician  ?                       in the pre-procedure area in the procedure room in the  ?                       endoscopy suite. Mental Status Examination: alert and  ?                       oriented. Airway Examination: normal oropharyngeal  ?                       airway and neck mobility. Respiratory Examination:  ?                       clear to auscultation. CV Examination: normal.  ?                       Prophylactic Antibiotics: The patient  does not require  ?                       prophylactic antibiotics. Prior Anticoagulants: The  ?                       patient has taken no previous anticoagulant or  ?                       antiplatelet  agents. ASA Grade Assessment: III - A  ?                       patient with severe systemic disease. After reviewing  ?                       the risks and benefits, the patient was deemed in  ?                       satisfactory condition to undergo the procedure. The  ?                       anesthesia plan was to use general anesthesia.  ?                       Immediately prior to administration of medications,  ?                       the patient was re-assessed for adequacy to receive  ?                       sedatives. The heart rate, respiratory rate, oxygen  ?                       saturations, blood pressure, adequacy of pulmonary  ?                       ventilation, and response to care were monitored  ?                       throughout the procedure. The physical status of the  ?                       patient was re-assessed after the procedure. ?                       After obtaining informed consent, the endoscope was  ?                       passed under direct vision. Throughout the procedure,  ?                       the patient's blood pressure, pulse, and oxygen  ?                       saturations were monitored continuously. The Endoscope  ?                       was introduced through the mouth, and advanced to the  ?  second part of duodenum. The upper GI endoscopy was  ?                       accomplished without difficulty. The patient tolerated  ?                       the procedure well. ?Findings: ?     The gastroesophageal junction and examined esophagus were normal. ?     A single 2 mm sessile polyp with no bleeding was found in the duodenal  ?     bulb. The polyp was removed with a cold biopsy forceps. Resection and  ?     retrieval were complete. ?     The second portion of the duodenum was normal. ?     A small hiatal hernia was present. ?     Diffuse mildly erythematous mucosa without bleeding was found in the  ?     gastric antrum. Biopsies were taken with a  cold forceps for Helicobacter  ?     pylori testing. ?     The gastric body and incisura were normal. Biopsies were taken with a  ?     cold forceps for Helicobacter pylori testing. ?     The cardia and gastric fundus were normal on retroflexion. ?Impression:            - Normal gastroesophageal junction and esophagus. ?                       - A single duodenal polyp. Resected and retrieved. ?                       - Normal second portion of the duodenum. ?                       - Small hiatal hernia. ?                       - Erythematous mucosa in the antrum. Biopsied. ?                       - Normal gastric body and incisura. Biopsied. ?Recommendation:        - Return patient to hospital ward for possible  ?                       discharge same day. ?                       - Resume regular diet today. ?                       - Continue present medications. ?                       - Perform a colonoscopy at appointment to be scheduled. ?                       - Follow an antireflux regimen. ?                       - Use Protonix (pantoprazole) 40 mg PO BID for 1 month. ?Procedure Code(s):     --- Professional --- ?  99371, Esophagogastroduodenoscopy, flexible,  ?                       transoral; with biopsy, single or multiple ?Diagnosis Code(s):     --- Professional --- ?                       K31.7, Polyp of stomach and duodenum ?                       K44.9, Diaphragmatic hernia without obstruction or  ?                       gangrene ?                       K92.1, Melena (includes Hematochezia) ?                       K31.89, Other diseases of stomach and duodenum ?CPT copyright 2019 American Medical Association. All rights reserved. ?The codes documented in this report are preliminary and upon coder review may  ?be revised to meet current compliance requirements. ?Dr. Ulyess Mort ?[place]ola Depaz Raeanne Gathers MD, MD ?04/16/2022 2:34:19 PM ?This report has been signed electronically. ?Number of  Addenda: 0 ?Note Initiated On: 04/16/2022 1:57 PM ?Estimated Blood Loss:  Estimated blood loss: none. ?     [facility] ?

## 2022-04-16 NOTE — Discharge Summary (Signed)
?Physician Discharge Summary ?  ?Patient: Keith Williamson MRN: [id number] DOB: 11/04/1946  ?Admit date:     04/15/2022  ?Discharge date: 04/16/22  ?Discharge Physician: Fritzi Mandes  ? ?PCP: Tracie Harrier, MD  ? ?Recommendations at discharge:  ? ?follow-up with PCP Dr. Ginette Pitman in 1-2 weeks ?follow-up Dr. Marius Ditch for outpatient colonoscopy ? ?Discharge Diagnoses: ?Melena ? ?Hospital Course: ?[name] is a 76 y.o. African-American male with medical history significant for coronary artery disease, BPH, GERD, diverticulosis, hyperlipidemia, hypertension and hypothyroidism, who presented to the ER with acute onset of epigastric abdominal pain with food and 2 episodes of melanotic bowel movements  per his wife who states that he has not taken Pepto-Bismol for several days ? ? ?Melena ?- His stool Hemoccult was negative, he reportedly had 2 melanotic BMs that were witnessed by his wife who is a LPN. ?- No bright red blood per rectum. Patient's wife tells me patient has stopped taking eliquis for last several days. ?-- Seen by G.I. Dr. Marius Ditch. EGD was performed ?--EGD: Normal gastroesophageal junction and esophagus. ?                       - A single duodenal polyp. Resected and retrieved. ?                       - Normal second portion of the duodenum. ?                       - Small hiatal hernia. ?                       - Erythematous mucosa in the antrum. Biopsied. ?                       - Normal gastric body and incisura. Biopsied. ?-- Continue Protonix. Per Dr. Marius Ditch okay to resume eliquis and will schedule outpatient colonoscopy. ? ?Orthostatic hypotension ?- T patient received IV fluids. Blood pressure has been stable. ? ?Dyslipidemia ?-  ?continue statin therapy. ?  ?Essential hypertension ?- continue his Atacand.  His renal functions have been stable. ?  ?BPH (benign prostatic hyperplasia) ?-  continue Avodart. ?  ?Paroxysmal atrial fibrillation ([place]) ?history of DVT and PE November 2022 ?- sinus rhythm. ?-  Resume eliquis  ? ?hypothyroidism, unspecified ?- continue to Synthroid. ?  ?Patient will discharged to home with outpatient colonoscopy. Discharge plan was discussed with patient's wife ? ?  ? ? ?Consultants: [facility].I. Dr. Marius Ditch ?Procedures performed: EGD ?Disposition: Home ?Diet recommendation:  ?Discharge Diet Orders (From admission, onward)  ? ?  Start     Ordered  ? 04/16/22 0000  Diet - low sodium heart healthy       ? 04/16/22 1452  ? ?  ?  ? ?  ? ?Cardiac diet ?DISCHARGE MEDICATION: ?Allergies as of 04/16/2022   ? ?   Reactions  ? Amlodipine Swelling  ? Tongue swelling ?Other reaction(s): SWELLING  ? Levsin [hyoscyamine] Anaphylaxis, Swelling, Rash  ? Beta Adrenergic Blockers Other (See Comments)  ? unknown  ? Hydralazine   ? Hydrochlorothiazide Other (See Comments)  ? "draws out too much fluid"  ? Other   ? Influenza virus vaccine tvs 2012-13 (18+) cell der  ? Zetia [ezetimibe]   ? Muscle spasms, numbness, tingling   ? ?  ? ?  ?Medication List  ?  ? ?STOP  taking these medications   ? ?aspirin 81 MG tablet ?  ?BACLOFEN PO ?  ?GI Cocktail (alum & mag hydroxide-simethicone/lidocaine)oral mixture ?  ? ?  ? ?TAKE these medications   ? ?allopurinol 100 MG tablet ?Commonly known as: ZYLOPRIM ?Take 100 mg by mouth daily. ?  ?apixaban 5 MG Tabs tablet ?Commonly known as: ELIQUIS ?Take 2 tablets (10 mg total) by mouth 2 (two) times daily for 5 days, THEN 1 tablet (5 mg total) 2 (two) times daily. ?Start taking on: August 08, 2021 ?  ?candesartan 32 MG tablet ?Commonly known as: ATACAND ?Take 32 mg by mouth daily. ?  ?colchicine 0.6 MG tablet ?Take 0.6 mg by mouth as needed. ?  ?docusate sodium 100 MG capsule ?Commonly known as: COLACE ?Take 2 capsules (200 mg total) by mouth 2 (two) times daily. ?  ?dutasteride 0.5 MG capsule ?Commonly known as: AVODART ?Take 0.5 mg by mouth daily. ?  ?fluticasone 50 MCG/ACT nasal spray ?Commonly known as: FLONASE ?Place 2 sprays into both nostrils daily. ?  ?HYDROcodone-acetaminophen  10-325 MG tablet ?Commonly known as: NORCO ?Take 1 tablet by mouth every 6 (six) hours as needed. ?  ?levothyroxine 137 MCG tablet ?Commonly known as: SYNTHROID ?Take 137 mcg by mouth daily before breakfast. ?  ?multivitamin with minerals tablet ?Take 1 tablet by mouth daily. ?  ?nitroGLYCERIN 0.4 MG SL tablet ?Commonly known as: NITROSTAT ?Place 0.4 mg under the tongue every 5 (five) minutes as needed for chest pain. ?  ?pantoprazole 40 MG tablet ?Commonly known as: Protonix ?Take 1 tablet (40 mg total) by mouth daily. ?  ?pregabalin 50 MG capsule ?Commonly known as: LYRICA ?Take 50 mg by mouth 2 (two) times daily. ?  ?rosuvastatin 40 MG tablet ?Commonly known as: CRESTOR ?Take 1 tablet (40 mg total) by mouth at bedtime. ?  ? ?  ? ?  ?  ? ? ?  ?Discharge Care Instructions  ?(From admission, onward)  ?  ? ? ?  ? ?  Start     Ordered  ? 04/16/22 0000  Discharge wound care:       ?Comments: Foam pad  ? 04/16/22 1452  ? ?  ?  ? ?  ? ? Follow-up Information   ? ? Tracie Harrier, MD. Schedule an appointment as soon as possible for a visit in 1 week(s).   ?Specialty: Internal Medicine ?Why: hosp f/u ?Contact information: ?[address] ?[place] [address] ?[phone] ? ? ?  ?  ? ? Lin Landsman, MD. Schedule an appointment as soon as possible for a visit in 1 week(s).   ?Specialty: Gastroenterology ?Why: for out pt colonoscopy ?Contact information: ?[place] [address] ?[phone] ? ? ?  ?  ? ?  ?  ? ?  ? ?Discharge Exam: ?Filed Weights  ? 04/15/22 1414  ?Weight: 90.7 kg  ? ? ? ?Condition at discharge: fair ? ?The results of significant diagnostics from this hospitalization (including imaging, microbiology, ancillary and laboratory) are listed below for reference.  ? ?Imaging Studies: ?CT Angio Neck W and/or Wo Contrast ? ?Result Date: 04/01/2022 ?CLINICAL DATA:  Neck pain status post chiropractic manipulation. Dizziness. EXAM: CT ANGIOGRAPHY NECK  TECHNIQUE: Multidetector CT imaging of the neck was performed using the standard protocol during bolus administration of intravenous contrast. Multiplanar CT image reconstructions and MIPs were obtained to evaluate the vascular anatomy. Carotid stenosis measurements (when applicable) are obtained utilizing NASCET criteria, using the distal internal  carotid diameter as the denominator. RADIATION DOSE REDUCTION: This exam was performed according to the departmental dose-optimization program which includes automated exposure control, adjustment of the mA and/or kV according to patient size and/or use of iterative reconstruction technique. CONTRAST:  42m OMNIPAQUE IOHEXOL 350 MG/ML SOLN COMPARISON:  Carotid Doppler ultrasound 05/31/2021. Neck CT 09/05/2007. FINDINGS: Aortic arch: Standard 3 vessel aortic arch with mild atherosclerotic plaque. No significant arch vessel origin stenosis. Right carotid system: Patent with a small amount of calcified and soft plaque at the carotid bifurcation. No evidence of a significant stenosis or dissection. Left carotid system: Patent with minimal soft and calcified plaque at the carotid bifurcation. No evidence of a significant stenosis or dissection. Vertebral arteries: Patent and codominant without evidence of a dissection or a significant stenosis on the left. Mild-to-moderate right V2 segment stenosis due to extrinsic compression from degenerative cervical spine spurring. Skeleton: Moderate disc and advanced facet degeneration in the cervical spine with advanced multilevel neural foraminal stenosis bilaterally. Moderate spinal stenosis at C3-4 due to a broad-based posterior disc osteophyte complex. Other neck: Fatty atrophy of the parotid glands. No evidence of cervical lymphadenopathy or mass. Upper chest: Clear lung apices. IMPRESSION: 1. Patent vertebral arteries without evidence of a dissection. 2. Mild-to-moderate right V2 stenosis due to extrinsic compression from cervical  spine spurring. 3. Mild cervical carotid artery atherosclerosis without stenosis. 4. Advanced cervical disc and facet degeneration. 5. Aortic Atherosclerosis (ICD10-I70.0). Electronically Signed   By: ASeymour BarsD.

## 2022-04-16 NOTE — Plan of Care (Signed)
?  Problem: Education: ?Goal: Knowledge of General Education information will improve ?Description: Including pain rating scale, medication(s)/side effects and non-pharmacologic comfort measures ?Outcome: Completed/Met ?  ?Problem: Health Behavior/Discharge Planning: ?Goal: Ability to manage health-related needs will improve ?Outcome: Completed/Met ?  ?Problem: Clinical Measurements: ?Goal: Ability to maintain clinical measurements within normal limits will improve ?Outcome: Completed/Met ?Goal: Will remain free from infection ?Outcome: Completed/Met ?Goal: Diagnostic test results will improve ?Outcome: Completed/Met ?Goal: Respiratory complications will improve ?Outcome: Completed/Met ?Goal: Cardiovascular complication will be avoided ?Outcome: Completed/Met ?  ?

## 2022-04-16 NOTE — Anesthesia Preprocedure Evaluation (Signed)
Anesthesia Evaluation  ?Patient identified by MRN, date of birth, ID band ?Patient awake ? ? ? ?Reviewed: ?Allergy & Precautions, NPO status , Patient's Chart, lab work & pertinent test results ? ?History of Anesthesia Complications ?Negative for: history of anesthetic complications ? ?Airway ?Mallampati: III ? ?TM Distance: >3 FB ?Neck ROM: full ? ? ? Dental ? ?(+) Chipped, Poor Dentition, Missing ?  ?Pulmonary ?neg shortness of breath, former smoker,  ?  ?Pulmonary exam normal ? ? ? ? ? ? ? Cardiovascular ?Exercise Tolerance: Good ?hypertension, (-) angina+ CAD and + Past MI  ?(-) DOE Normal cardiovascular exam ? ? ?  ?Neuro/Psych ? Headaches, negative psych ROS  ? GI/Hepatic ?Neg liver ROS, GERD  Controlled,  ?Endo/Other  ?Hypothyroidism  ? Renal/GU ?negative Renal ROS  ?negative genitourinary ?  ?Musculoskeletal ? ? Abdominal ?  ?Peds ? Hematology ?negative hematology ROS ?(+)   ?Anesthesia Other Findings ?Past Medical History: ?No date: Arthritis ?No date: BPH (benign prostatic hypertrophy) ?No date: CAD (coronary artery disease) ?No date: Diverticulosis ?No date: Enlarged prostate ?No date: GERD (gastroesophageal reflux disease) ?No date: Headache(784.0) ?    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    Comment:  inferior; s/p PCI with stent placement to RCA ?No date: OA (osteoarthritis) ?No date: Retina hole ?No date: Senile nuclear sclerosis ?No date: Thyroid cancer ([place]) ?    Comment:  iodine tx ?No date: Valvular heart disease ? ?Past Surgical History: ?No date: ANGIOPLASTY / STENTING FEMORAL ?No date: BACK SURGERY ?05/07/2016: COLONOSCOPY WITH PROPOFOL; N/A ?    Comment:  Procedure: COLONOSCOPY WITH PROPOFOL;  Surgeon: Billie Ruddy ?             Gustavo Lah, MD;  Location: [facility] ENDOSCOPY;  Service:  ?             Endoscopy;  Laterality:  N/A; ?05/07/2016: ESOPHAGOGASTRODUODENOSCOPY (EGD) WITH PROPOFOL; N/A ?    Comment:  Procedure: ESOPHAGOGASTRODUODENOSCOPY (EGD) WITH  ?             PROPOFOL;  Surgeon: Lollie Sails, MD;  Location:  ?             [facility]COPY;  Service: Endoscopy;  Laterality: N/A; ?01/01/2021: GREEN LIGHT LASER TURP (TRANSURETHRAL RESECTION OF  ?PROSTATE; N/A ?    Comment:  Procedure: GREEN LIGHT LASER TURP (TRANSURETHRAL  ?             RESECTION OF PROSTATE;  Surgeon: Royston Cowper, MD;   ?             Location: [facility] ORS;  Service: Urology;  Laterality: N/A; ?No date: JOINT REPLACEMENT ?No date: KNEE ARTHROSCOPY; Left ?    Comment:  Medial and lateral meniscectomy, partial ?08/16/2012: LUMBAR LAMINECTOMY ?    Comment:  L 3 4 & 5 ?No date: LUMBAR MICRODISCECTOMY ?    Comment:  L3-L4; with partial hemilaminectomy, partial facetectomy ?             and foraminotomy ?No date: TOTAL THYROIDECTOMY ?No date: TURP VAPORIZATION ? ?BMI   ? Body Mass Index: 25.68 kg/m?  ?  ? ? Reproductive/Obstetrics ?negative OB ROS ? ?  ? ? ? ? ? ? ? ? ? ? ? ? ? ?  ?  ? ? ? ? ? ? ? ? ?Anesthesia Physical ?Anesthesia Plan ? ?ASA: 3 ? ?Anesthesia Plan: General  ? ?  Post-op Pain Management:   ? ?Induction: Intravenous ? ?PONV Risk Score and Plan: Propofol infusion and TIVA ? ?Airway Management Planned: Natural Airway and Nasal Cannula ? ?Additional Equipment:  ? ?Intra-op Plan:  ? ?Post-operative Plan:  ? ?Informed Consent: I have reviewed the patients History and Physical, chart, labs and discussed the procedure including the risks, benefits and alternatives for the proposed anesthesia with the patient or authorized representative who has indicated his/her understanding and acceptance.  ? ? ? ?Dental Advisory Given ? ?Plan Discussed with: Anesthesiologist, CRNA and Surgeon ? ?Anesthesia Plan Comments: (Patient consented for risks of anesthesia including but not limited to:  ?- adverse reactions to medications ?- risk of airway placement if  required ?- damage to eyes, teeth, lips or other oral mucosa ?- nerve damage due to positioning  ?- sore throat or hoarseness ?- Damage to heart, brain, nerves, lungs, other parts of body or loss of life ? ?Patient voiced understanding.)  ? ? ? ? ? ? ?Anesthesia Quick Evaluation ? ?

## 2022-04-16 NOTE — Assessment & Plan Note (Signed)
-   We will continue to Synthroid. ?

## 2022-04-16 NOTE — Progress Notes (Signed)
Patient discharged to home via wife transport with all belongings. AVS and medications reviewed with patient and wife. PIVX1 removed with catheter intact. No complications or questions at this time. ?

## 2022-04-16 NOTE — Assessment & Plan Note (Signed)
-   We will continue his Atacand.  His renal functions have been stable. ?

## 2022-04-16 NOTE — H&P (Addendum)
?  ?  ?[place] ? ? ?PATIENT NAME: Keith Williamson   ? ?MR#:  [id number] ? ?DATE OF BIRTH:  05-Jul-1946 ? ?DATE OF ADMISSION:  04/15/2022 ? ?PRIMARY CARE PHYSICIAN: Tracie Harrier, MD  ? ?Patient is coming from: Home ? ?REQUESTING/REFERRING PHYSICIAN: Ward, Delice [place], DO ? ?CHIEF COMPLAINT:  ? ?Chief Complaint  ?Patient presents with  ? Dizziness  ? GI Problem  ? ? ?HISTORY OF PRESENT ILLNESS:  ?Keith Williamson is a 76 y.o. African-American male with medical history significant for coronary artery disease, BPH, GERD, diverticulosis, hyperlipidemia, hypertension and hypothyroidism, who presented to the ER with acute onset of epigastric abdominal pain with food and 2 episodes of melanotic bowel movements with offensive smell per his wife who states that he has not taken Pepto-Bismol for several days.  His pain has been worsening with food.  He has been experiencing dizziness and vertigo for the last month.  No nausea or vomiting or fever or chills.  No bright red bleeding per rectum.  No cough or wheezing or hemoptysis.  No dysuria, oliguria or hematuria or flank pain.  No other bleeding diathesis.  He was reportedly orthostatic in the ER per the ER physician. ? ?ED Course: When he came to the ER, BP was 170/99 with otherwise normal vital signs.  Labs revealed a BUN of 25 with a creatinine of 1.25 with a high sensitive troponin I of 2 and later for normal CBC with hemoglobin of 13 hematocrit 41.1.  TSH was 0.24 and free T41.2.  UA was unremarkable.  Stool Hemoccult came back negative. ? ?EKG as reviewed by me : EKG showed normal sinus rhythm with a rate of 74 with incomplete right bundle branch block and left anterior fascicular block with moderate voltage criteria for LVH ?Imaging: Portable chest ray showed no acute cardiopulmonary disease. ? ?The patient was given 1 L bolus of IV normal saline, 40 mg of IV Protonix and 30 mL of p.o. Maalox.  He will be admitted to medical telemetry observation bed for further  evaluation and management. ?PAST MEDICAL HISTORY:  ? ?Past Medical History:  ?Diagnosis Date  ? Arthritis   ? BPH (benign prostatic hypertrophy)   ? CAD (coronary artery disease)   ? Diverticulosis   ? Enlarged prostate   ? GERD (gastroesophageal reflux disease)   ? Headache(784.0)   ? frequent sinus headache  ? Hurthle cell tumor   ? Hyperlipidemia   ? Hypertension   ? Hypothyroidism   ? MI (myocardial infarction) ([place]) 2014  ? inferior; s/p PCI with stent placement to RCA  ? OA (osteoarthritis)   ? Retina hole   ? Senile nuclear sclerosis   ? Thyroid cancer ([place])   ? iodine tx  ? Valvular heart disease   ? ? ?PAST SURGICAL HISTORY:  ? ?Past Surgical History:  ?Procedure Laterality Date  ? ANGIOPLASTY / STENTING FEMORAL    ? BACK SURGERY    ? COLONOSCOPY WITH PROPOFOL N/A 05/07/2016  ? Procedure: COLONOSCOPY WITH PROPOFOL;  Surgeon: Lollie Sails, MD;  Location: [facility] ENDOSCOPY;  Service: Endoscopy;  Laterality: N/A;  ? ESOPHAGOGASTRODUODENOSCOPY (EGD) WITH PROPOFOL N/A 05/07/2016  ? Procedure: ESOPHAGOGASTRODUODENOSCOPY (EGD) WITH PROPOFOL;  Surgeon: Lollie Sails, MD;  Location: [facility] ENDOSCOPY;  Service: Endoscopy;  Laterality: N/A;  ? GREEN LIGHT LASER TURP (TRANSURETHRAL RESECTION OF PROSTATE N/A 01/01/2021  ? Procedure: GREEN LIGHT LASER TURP (TRANSURETHRAL RESECTION OF PROSTATE;  Surgeon: Royston Cowper, MD;  Location: [facility] ORS;  Service: Urology;  Laterality: N/A;  ? JOINT REPLACEMENT    ? KNEE ARTHROSCOPY Left   ? Medial and lateral meniscectomy, partial  ? LUMBAR LAMINECTOMY  08/16/2012  ? L 3 4 & 5  ? LUMBAR MICRODISCECTOMY    ? L3-L4; with partial hemilaminectomy, partial facetectomy and foraminotomy  ? TOTAL THYROIDECTOMY    ? TURP VAPORIZATION    ? ? ?SOCIAL HISTORY:  ? ?Social History  ? ?Tobacco Use  ? Smoking status: Former  ?  Types: Cigarettes  ?  Quit date: 12/17/1981  ?  Years since quitting: 40.3  ? Smokeless tobacco: Never  ?Substance Use Topics  ? Alcohol use: No  ? ? ?FAMILY HISTORY:   ? Positive for MI in his mother and colon cancer in his brother and sister.  He had a cystoscopy with breast cancer.  History is otherwise positive for hypertension. ? ?DRUG ALLERGIES:  ? ?Allergies  ?Allergen Reactions  ? Amlodipine Swelling  ?  Tongue swelling ?Other reaction(s): SWELLING  ? Levsin [Hyoscyamine] Anaphylaxis, Swelling and Rash  ? Beta Adrenergic Blockers Other (See Comments)  ?  unknown  ? Hydralazine   ? Hydrochlorothiazide Other (See Comments)  ?  "draws out too much fluid"  ? Other   ?  Influenza virus vaccine tvs 2012-13 (18+) cell der  ? Zetia [Ezetimibe]   ?  Muscle spasms, numbness, tingling   ? ? ?REVIEW OF SYSTEMS:  ? ?ROS ?As per history of present illness. All pertinent systems were reviewed above. Constitutional, HEENT, cardiovascular, respiratory, GI, GU, musculoskeletal, neuro, psychiatric, endocrine, integumentary and hematologic systems were reviewed and are otherwise negative/unremarkable except for positive findings mentioned above in the HPI. ? ? ?MEDICATIONS AT HOME:  ? ?Prior to Admission medications   ?Medication Sig Start Date End Date Taking? Authorizing Provider  ?allopurinol (ZYLOPRIM) 100 MG tablet Take 100 mg by mouth daily.   Yes [provider]  ?apixaban (ELIQUIS) 5 MG TABS tablet Take 2 tablets (10 mg total) by mouth 2 (two) times daily for 5 days, THEN 1 tablet (5 mg total) 2 (two) times daily. 08/08/21 04/16/22 Yes Nicole Kindred A, DO  ?candesartan (ATACAND) 32 MG tablet Take 32 mg by mouth daily.   Yes [provider]  ?docusate sodium (COLACE) 100 MG capsule Take 2 capsules (200 mg total) by mouth 2 (two) times daily. 01/01/21  Yes Royston Cowper, MD  ?dutasteride (AVODART) 0.5 MG capsule Take 0.5 mg by mouth daily.   Yes [provider]  ?fluticasone (FLONASE) 50 MCG/ACT nasal spray Place 2 sprays into both nostrils daily.   Yes [provider]  ?HYDROcodone-acetaminophen (NORCO) 10-325 MG tablet Take 1 tablet by mouth every  6 (six) hours as needed. 04/05/22  Yes [provider]  ?levothyroxine (SYNTHROID, LEVOTHROID) 137 MCG tablet Take 137 mcg by mouth daily before breakfast.   Yes [provider]  ?Multiple Vitamins-Minerals (MULTIVITAMIN WITH MINERALS) tablet Take 1 tablet by mouth daily.   Yes [provider]  ?pantoprazole (PROTONIX) 40 MG tablet Take 1 tablet (40 mg total) by mouth daily. 08/29/21 08/29/22 Yes Paduchowski, Lennette Bihari, MD  ?pregabalin (LYRICA) 50 MG capsule Take 50 mg by mouth 2 (two) times daily. 07/10/21  Yes [provider]  ?rosuvastatin (CRESTOR) 40 MG tablet Take 1 tablet (40 mg total) by mouth at bedtime. 08/08/21  Yes Nicole Kindred A, DO  ?aspirin 81 MG tablet Take 81 mg by mouth daily. ?Patient not taking: Reported on 05/31/2021    [provider]  ?  BACLOFEN PO Take by mouth. ?Patient not taking: Reported on 04/16/2022    [provider]  ?colchicine 0.6 MG tablet Take 0.6 mg by mouth as needed.    [provider]  ?GI Cocktail (alum & mag hydroxide-simethicone/lidocaine)oral mixture Take 30 mLs by mouth 2 (two) times daily as needed (epigastric pain/burning). Each does to contain 70m Alum/mag Hydroxide and 129mLidocaine ?Patient not taking: Reported on 04/16/2022 08/29/21   PaHarvest DarkMD  ?nitroGLYCERIN (NITROSTAT) 0.4 MG SL tablet Place 0.4 mg under the tongue every 5 (five) minutes as needed for chest pain.    [provider]  ? ?  ? ?VITAL SIGNS:  ?Blood pressure (!) 171/96, pulse 69, temperature 97.9 ?F (36.6 ?C), temperature source Oral, resp. rate 20, height '6\' 2"'$  (1.88 m), weight 90.7 kg, SpO2 100 %. ? ?PHYSICAL EXAMINATION:  ?Physical Exam ? ?GENERAL:  [age].o.-year-old African-American patient lying in the bed with no acute distress.  ?EYES: Pupils equal, round, reactive to light and accommodation. No scleral icterus. Extraocular muscles intact.  ?HEENT: Head atraumatic, normocephalic. Oropharynx and nasopharynx clear.  ?NECK:   Supple, no jugular venous distention. No thyroid enlargement, no tenderness.  ?LUNGS: Normal breath sounds bilaterally, no wheezing, rales,rhonchi or crepitation. No use of accessory muscles of respiratio

## 2022-04-16 NOTE — Assessment & Plan Note (Signed)
-   We will continue Avodart. ?

## 2022-04-16 NOTE — Anesthesia Procedure Notes (Signed)
Procedure Name: [place] ?Date/Time: 04/16/2022 2:21 PM ?Performed by: Jerrye Noble, CRNA ?Pre-anesthesia Checklist: Patient identified, Emergency Drugs available, Suction available and Patient being monitored ?Patient Re-evaluated:Patient Re-evaluated prior to induction ?Oxygen Delivery Method: Nasal cannula ? ? ? ? ?

## 2022-04-16 NOTE — Consult Note (Addendum)
? ? ? ?Cephas Darby, MD ?[address]  ?[address]  ?[place], [place] [address]  ?Main: [phone]  ?Fax: [phone] ?Pager: [phone] ? ? Consultation ? ?Referring Provider:     No ref. provider found ?Primary Care Physician:  Tracie Harrier, MD ?Primary Gastroenterologist: Althia Forts         ?Reason for Consultation:     Melena, anemia ? ?Date of Admission:  04/15/2022 ?Date of Consultation:  04/16/2022 ?       ? HPI:   ?Keith Williamson is a 76 y.o. male history of coronary artery disease s/p PCI to RCA in 2014, history of PE/DVT in 10/2021 and 09/2021 supposed to be on Eliquis, stopped a week ago, on aspirin 81 mg daily, hypertension, hyperlipidemia, hypothyroidism presented to ER yesterday with epigastric abdominal pain and 2 episodes of melena.  Patient generally takes Pepto-Bismol but he has not been taking it in the last few days.  His epigastric pain is worse with eating.  Patient has been feeling weak overall within last 1 month.  He denies any nausea or vomiting.  Patient was hemodynamically stable in the ER, he had mildly elevated BUN/creatinine 25/1.25, hemoglobin 13.  His hemoglobin today is 12.3, BUN/creatinine 20/1.06.  Patient is started on Protonix IV twice daily and admitted under observation.  Patient is currently n.p.o. ? ?Patient was originally scheduled to undergo EGD and colonoscopy on 414, but he canceled due to feeling lightheaded and dizzy. ?NSAIDs: None ? ?Antiplts/Anticoagulants/Anti thrombotics: Last dose of Eliquis a week ago ? ?GI Procedures: None ? ?Past Medical History:  ?Diagnosis Date  ? Arthritis   ? BPH (benign prostatic hypertrophy)   ? CAD (coronary artery disease)   ? Diverticulosis   ? Enlarged prostate   ? GERD (gastroesophageal reflux disease)   ? Headache(784.0)   ? frequent sinus headache  ? Hurthle cell tumor   ? Hyperlipidemia   ? Hypertension   ? Hypothyroidism   ? MI (myocardial infarction) ([place]) 2014  ? inferior; s/p PCI with stent placement to RCA  ? OA  (osteoarthritis)   ? Retina hole   ? Senile nuclear sclerosis   ? Thyroid cancer ([place])   ? iodine tx  ? Valvular heart disease   ? ? ?Past Surgical History:  ?Procedure Laterality Date  ? ANGIOPLASTY / STENTING FEMORAL    ? BACK SURGERY    ? COLONOSCOPY WITH PROPOFOL N/A 05/07/2016  ? Procedure: COLONOSCOPY WITH PROPOFOL;  Surgeon: Lollie Sails, MD;  Location: [facility] ENDOSCOPY;  Service: Endoscopy;  Laterality: N/A;  ? ESOPHAGOGASTRODUODENOSCOPY (EGD) WITH PROPOFOL N/A 05/07/2016  ? Procedure: ESOPHAGOGASTRODUODENOSCOPY (EGD) WITH PROPOFOL;  Surgeon: Lollie Sails, MD;  Location: [facility] ENDOSCOPY;  Service: Endoscopy;  Laterality: N/A;  ? GREEN LIGHT LASER TURP (TRANSURETHRAL RESECTION OF PROSTATE N/A 01/01/2021  ? Procedure: GREEN LIGHT LASER TURP (TRANSURETHRAL RESECTION OF PROSTATE;  Surgeon: Royston Cowper, MD;  Location: [facility] ORS;  Service: Urology;  Laterality: N/A;  ? JOINT REPLACEMENT    ? KNEE ARTHROSCOPY Left   ? Medial and lateral meniscectomy, partial  ? LUMBAR LAMINECTOMY  08/16/2012  ? L 3 4 & 5  ? LUMBAR MICRODISCECTOMY    ? L3-L4; with partial hemilaminectomy, partial facetectomy and foraminotomy  ? TOTAL THYROIDECTOMY    ? TURP VAPORIZATION    ? ?Current Facility-Administered Medications:  ?  0.9 %  sodium chloride infusion, , Intravenous, Continuous, Mansy, Jan A, MD, Last Rate: 100 mL/hr at 04/16/22 0631, Infusion Verify at 04/16/22 0631 ?  acetaminophen (TYLENOL) tablet 650 mg, 650 mg, Oral, Q6H PRN **OR** acetaminophen (TYLENOL) suppository 650 mg, 650 mg, Rectal, Q6H PRN, Mansy, Jan A, MD ?  allopurinol (ZYLOPRIM) tablet 100 mg, 100 mg, Oral, Daily, Mansy, Jan A, MD, 100 mg at 04/16/22 3810 ?  colchicine tablet 0.6 mg, 0.6 mg, Oral, PRN, Mansy, Jan A, MD ?  docusate sodium (COLACE) capsule 200 mg, 200 mg, Oral, BID, Mansy, Jan A, MD, 200 mg at 04/16/22 1751 ?  dutasteride (AVODART) capsule 0.5 mg, 0.5 mg, Oral, Daily, Mansy, Jan A, MD, 0.5 mg at 04/16/22 0258 ?  fluticasone (FLONASE) 50  MCG/ACT nasal spray 2 spray, 2 spray, Each Nare, Daily PRN, Mansy, Jan A, MD ?  folic acid (FOLVITE) tablet 1 mg, 1 mg, Oral, Daily, Aimi Essner, Tally Due, MD, 1 mg at 04/16/22 5277 ?  HYDROcodone-acetaminophen (NORCO) 10-325 MG per tablet 1 tablet, 1 tablet, Oral, Q6H PRN, Mansy, Arvella Merles, MD, 1 tablet at 04/16/22 0538 ?  irbesartan (AVAPRO) tablet 37.5 mg, 37.5 mg, Oral, Daily, Mansy, Jan A, MD, 37.5 mg at 04/16/22 8242 ?  levothyroxine (SYNTHROID) tablet 137 mcg, 137 mcg, Oral, Q0600, Mansy, Jan A, MD, 137 mcg at 04/16/22 3536 ?  multivitamin with minerals tablet 1 tablet, 1 tablet, Oral, Daily, Mansy, Jan A, MD, 1 tablet at 04/16/22 1443 ?  nitroGLYCERIN (NITROSTAT) SL tablet 0.4 mg, 0.4 mg, Sublingual, Q5 min PRN, Mansy, Jan A, MD ?  ondansetron (ZOFRAN) tablet 4 mg, 4 mg, Oral, Q6H PRN **OR** ondansetron (ZOFRAN) injection 4 mg, 4 mg, Intravenous, Q6H PRN, Mansy, Jan A, MD ?  pantoprazole (PROTONIX) injection 40 mg, 40 mg, Intravenous, Q12H, Mansy, Jan A, MD, 40 mg at 04/16/22 1540 ?  pregabalin (LYRICA) capsule 50 mg, 50 mg, Oral, BID, Mansy, Jan A, MD, 50 mg at 04/16/22 0867 ?  rosuvastatin (CRESTOR) tablet 40 mg, 40 mg, Oral, QHS, Mansy, Jan A, MD ?  traZODone (DESYREL) tablet 25 mg, 25 mg, Oral, QHS PRN, Mansy, Arvella Merles, MD ? ?History reviewed. No pertinent family history.  ? ?Social History  ? ?Tobacco Use  ? Smoking status: Former  ?  Types: Cigarettes  ?  Quit date: 12/17/1981  ?  Years since quitting: 40.3  ? Smokeless tobacco: Never  ?Vaping Use  ? Vaping Use: Never used  ?Substance Use Topics  ? Alcohol use: No  ? Drug use: No  ? ? ?Allergies as of 04/15/2022 - Review Complete 04/15/2022  ?Allergen Reaction Noted  ? Amlodipine Swelling 08/04/2012  ? Levsin [hyoscyamine] Anaphylaxis, Swelling, and Rash 12/16/2020  ? Beta adrenergic blockers Other (See Comments) 08/04/2012  ? Hydralazine  05/06/2016  ? Hydrochlorothiazide Other (See Comments) 08/04/2012  ? Other  05/06/2016  ? Zetia [ezetimibe]  12/16/2020   ? ? ?Review of Systems:    ?All systems reviewed and negative except where noted in HPI. ? ? Physical Exam:  ?Vital signs in last 24 hours: ?Temp:  [97.9 ?F (36.6 ?C)-98.5 ?F (36.9 ?C)] 98 ?F (36.7 ?C) (05/05 6195) ?Pulse Rate:  [66-78] 66 (05/05 0718) ?Resp:  [17-20] 19 (05/05 0718) ?BP: (137-180)/(79-104) 158/82 (05/05 0932) ?SpO2:  [99 %-100 %] 100 % (05/05 0718) ?Weight:  [90.7 kg] 90.7 kg (05/04 1414) ?Last BM Date : 04/15/22 ?General:   Pleasant, cooperative in NAD ?Head:  Normocephalic and atraumatic. ?Eyes:   No icterus.   Conjunctiva pink. PERRLA. ?Ears:  Normal auditory acuity. ?Neck:  Supple; no masses or thyroidomegaly ?Lungs: Respirations even and unlabored. Lungs clear to auscultation bilaterally.  No wheezes, crackles, or rhonchi.  ?Heart:  Regular rate and rhythm;  Without murmur, clicks, rubs or gallops ?Abdomen:  Soft, nondistended, nontender. Normal bowel sounds. No appreciable masses or hepatomegaly.  No rebound or guarding.  ?Rectal:  Not performed. ?Msk:  Symmetrical without gross deformities.  Strength generalized weakness ?Extremities:  Without edema, cyanosis or clubbing. ?Neurologic:  Alert and oriented x3;  grossly normal neurologically. ?Skin:  Intact without significant lesions or rashes. ?Cervical Nodes:  No significant cervical adenopathy. ?Psych:  Alert and cooperative. Normal affect. ? ?LAB RESULTS: ? ?  Latest Ref Rng & Units 04/16/2022  ?  4:44 AM 04/15/2022  ?  2:21 PM 04/01/2022  ?  8:07 AM  ?CBC  ?WBC 4.0 - 10.5 K/uL 6.5   6.5   8.6    ?Hemoglobin 13.0 - 17.0 g/dL 12.3   13.0   12.4    ?Hematocrit 39.0 - 52.0 % 38.9   41.1   38.6    ?Platelets 150 - 400 K/uL 304   300   187    ? ? ?BMET ? ?  Latest Ref Rng & Units 04/16/2022  ?  4:44 AM 04/15/2022  ?  2:21 PM 04/01/2022  ?  8:07 AM  ?BMP  ?Glucose 70 - 99 mg/dL 82   94   90    ?BUN 8 - 23 mg/dL '20   25   23    '$ ?Creatinine 0.61 - 1.24 mg/dL 1.06   1.25   1.35    ?Sodium 135 - 145 mmol/L 140   141   138    ?Potassium 3.5 - 5.1 mmol/L 3.7    4.6   3.9    ?Chloride 98 - 111 mmol/L 105   107   104    ?CO2 22 - 32 mmol/L '27   29   28    '$ ?Calcium 8.9 - 10.3 mg/dL 9.0   9.5   9.1    ? ? ?LFT ? ?  Latest Ref Rng & Units 08/29/2021  ?  6:38 AM 8/27/

## 2022-04-16 NOTE — Transfer of Care (Signed)
Immediate Anesthesia Transfer of Care Note ? ?Patient: Artyom Stencel ? ?Procedure(s) Performed: ESOPHAGOGASTRODUODENOSCOPY (EGD) WITH PROPOFOL ? ?Patient Location: PACU and Endoscopy Unit ? ?Anesthesia Type:General ? ?Level of Consciousness: drowsy and patient cooperative ? ?Airway & Oxygen Therapy: Patient Spontanous Breathing ? ?Post-op Assessment: Report given to RN and Post -op Vital signs reviewed and stable ? ?Post vital signs: Reviewed and stable ? ?Last Vitals:  ?Vitals Value Taken Time  ?BP 140/90 04/16/22 1437  ?Temp    ?Pulse 72 04/16/22 1438  ?Resp 25 04/16/22 1438  ?SpO2 98 % 04/16/22 1438  ?Vitals shown include unvalidated device data. ? ?Last Pain:  ?Vitals:  ? 04/16/22 1437  ?TempSrc: (P) Temporal  ?PainSc: (P) Asleep  ?   ? ?Patients Stated Pain Goal: 0 (04/16/22 0900) ? ?Complications: No notable events documented. ?

## 2022-04-16 NOTE — Assessment & Plan Note (Signed)
-   This could be related to possible GI bleeding and volume depletion. ?- She will be hydrated with IV normal saline and will follow orthostatics. ?

## 2022-04-16 NOTE — Assessment & Plan Note (Signed)
-   She is currently in sinus rhythm. ?- We will holding off Eliquis for the possibility of GI bleeding. ?

## 2022-04-16 NOTE — Assessment & Plan Note (Signed)
-   We will continue statin therapy. 

## 2022-04-16 NOTE — Plan of Care (Signed)
?  Problem: Education: ?Goal: Knowledge of General Education information will improve ?Description: Including pain rating scale, medication(s)/side effects and non-pharmacologic comfort measures ?Outcome: Progressing ?  ?Problem: Clinical Measurements: ?Goal: Ability to maintain clinical measurements within normal limits will improve ?Outcome: Progressing ?Goal: Respiratory complications will improve ?Outcome: Progressing ?Goal: Cardiovascular complication will be avoided ?Outcome: Progressing ?  ?Problem: Activity: ?Goal: Risk for activity intolerance will decrease ?Outcome: Progressing ?  ?Problem: Coping: ?Goal: Level of anxiety will decrease ?Outcome: Progressing ?  ?Problem: Elimination: ?Goal: Will not experience complications related to urinary retention ?Outcome: Progressing ?  ?Problem: Pain Managment: ?Goal: General experience of comfort will improve ?Outcome: Progressing ?  ?Problem: Safety: ?Goal: Ability to remain free from injury will improve ?Outcome: Progressing ?  ?

## 2022-04-16 NOTE — Assessment & Plan Note (Signed)
-   His stool Hemoccult was negative, he reportedly had 2 melanotic offensive BMs that were witnessed by his wife who is a LPN. ?- He will be admitted to an observation medical telemetry bed. ?- We will place him on IV PPI therapy with Protonix. ?- We will hold off aspirin and Eliquis. ?- We will place him on hydration with IV normal saline and keep him n.p.o. after midnight. ?- We will obtain a GI consultation. ?- I notified Dr. Allen Norris about the patient. ?

## 2022-04-19 ENCOUNTER — Telehealth: Payer: Self-pay

## 2022-04-19 ENCOUNTER — Encounter: Payer: Self-pay | Admitting: Gastroenterology

## 2022-04-19 ENCOUNTER — Other Ambulatory Visit: Payer: Self-pay

## 2022-04-19 DIAGNOSIS — Z0189 Encounter for other specified special examinations: Secondary | ICD-10-CM

## 2022-04-19 DIAGNOSIS — M25551 Pain in right hip: Secondary | ICD-10-CM | POA: Diagnosis not present

## 2022-04-19 DIAGNOSIS — K921 Melena: Secondary | ICD-10-CM

## 2022-04-19 DIAGNOSIS — Z96641 Presence of right artificial hip joint: Secondary | ICD-10-CM | POA: Diagnosis not present

## 2022-04-19 MED ORDER — CLENPIQ 10-3.5-12 MG-GM -GM/160ML PO SOLN: 320.0000 mL | Freq: Once | ORAL | 0 refills | Status: AC

## 2022-04-19 NOTE — Telephone Encounter (Signed)
-----   Message from Lin Landsman, MD sent at 04/16/2022  2:49 PM EDT ----- ?Regarding: Outpatient colonoscopy ?Caryl Pina ? ?Please call this patient and schedule outpatient colonoscopy ?Dx: Melena, negative EGD, history of PE/DVT of unclear etiology ?He is also on Eliquis, will need to obtain clearance for interruption from his PCP, Dr. Ginette Pitman ? ?Thank you ?Rohini Vanga ? ?

## 2022-04-19 NOTE — Anesthesia Postprocedure Evaluation (Signed)
Anesthesia Post Note ? ?Patient: Keith Williamson ? ?Procedure(s) Performed: ESOPHAGOGASTRODUODENOSCOPY (EGD) WITH PROPOFOL ? ?Patient location during evaluation: PACU ?Anesthesia Type: General ?Level of consciousness: awake and alert ?Pain management: pain level controlled ?Vital Signs Assessment: post-procedure vital signs reviewed and stable ?Respiratory status: spontaneous breathing, nonlabored ventilation and respiratory function stable ?Cardiovascular status: blood pressure returned to baseline and stable ?Postop Assessment: no apparent nausea or vomiting ?Anesthetic complications: no ? ? ?No notable events documented. ? ? ?Last Vitals:  ?Vitals:  ? 04/16/22 1457 04/16/22 1532  ?BP: (!) 150/92 (!) 154/87  ?Pulse: 65 66  ?Resp: 13 16  ?Temp:  (!) 36.4 ?C  ?SpO2: 98% 99%  ?  ?Last Pain:  ?Vitals:  ? 04/16/22 1532  ?TempSrc: Oral  ?PainSc:   ? ? ?  ?  ?  ?  ?  ?  ? ?[place] Ouch ? ? ? ? ?

## 2022-04-19 NOTE — Telephone Encounter (Signed)
Patient gave me the okay to schedule colonoscopy with His wife. Schedule patient for colonoscopy for 05/11/2022. Went over instructions and sent prep to the pharmacy. They are coming to sign a DPR today and will pick up the instructions. Mailed them and sent to [facility] also. Sent blood thinner request to PCP  ?

## 2022-04-20 LAB — SURGICAL PATHOLOGY

## 2022-04-21 ENCOUNTER — Telehealth: Payer: Self-pay

## 2022-04-21 DIAGNOSIS — Z09 Encounter for follow-up examination after completed treatment for conditions other than malignant neoplasm: Secondary | ICD-10-CM | POA: Diagnosis not present

## 2022-04-21 DIAGNOSIS — D649 Anemia, unspecified: Secondary | ICD-10-CM | POA: Diagnosis not present

## 2022-04-21 DIAGNOSIS — A048 Other specified bacterial intestinal infections: Secondary | ICD-10-CM

## 2022-04-21 DIAGNOSIS — I10 Essential (primary) hypertension: Secondary | ICD-10-CM | POA: Diagnosis not present

## 2022-04-21 DIAGNOSIS — C73 Malignant neoplasm of thyroid gland: Secondary | ICD-10-CM | POA: Diagnosis not present

## 2022-04-21 DIAGNOSIS — Z955 Presence of coronary angioplasty implant and graft: Secondary | ICD-10-CM | POA: Diagnosis not present

## 2022-04-21 DIAGNOSIS — I2510 Atherosclerotic heart disease of native coronary artery without angina pectoris: Secondary | ICD-10-CM | POA: Diagnosis not present

## 2022-04-21 MED ORDER — CLARITHROMYCIN 500 MG PO TABS: 500.0000 mg | ORAL_TABLET | Freq: Two times a day (BID) | ORAL | 0 refills | Status: AC

## 2022-04-21 MED ORDER — OMEPRAZOLE 20 MG PO CPDR: 20.0000 mg | DELAYED_RELEASE_CAPSULE | Freq: Two times a day (BID) | ORAL | 0 refills | Status: DC

## 2022-04-21 MED ORDER — AMOXICILLIN 500 MG PO TABS: 1000.0000 mg | ORAL_TABLET | Freq: Two times a day (BID) | ORAL | 0 refills | Status: AC

## 2022-04-21 NOTE — Telephone Encounter (Signed)
Patient verbalized understanding of results. Sent medication to the pharmacy and order lab test  ?

## 2022-04-21 NOTE — Telephone Encounter (Signed)
-----   Message from Lin Landsman, MD sent at 04/20/2022 10:48 PM EDT ----- ?Pathology results from recent EGD confirmed H Pylori infection. Recommend triple therapy to treat H Pylori for 14days ? ?Omeprazole '20mg'$  BID ?Clarithromycin '500mg'$  BID ?Amoxicillin 1gm BID ? ?Order H Pylori breath test in 4weeks after completing medication to confirm eradication. He should be off prilosec and H2 blocker atleast for 2weeks before the test ? ?Thanks ?RV ? ? ? ?

## 2022-04-21 NOTE — Telephone Encounter (Signed)
Called patient and left a message for call back  

## 2022-04-22 ENCOUNTER — Telehealth: Payer: Self-pay

## 2022-04-22 DIAGNOSIS — Z96641 Presence of right artificial hip joint: Secondary | ICD-10-CM | POA: Diagnosis not present

## 2022-04-22 DIAGNOSIS — M25551 Pain in right hip: Secondary | ICD-10-CM | POA: Diagnosis not present

## 2022-04-22 NOTE — Telephone Encounter (Signed)
PCP got back about stopping the Eliquis. He needs to stop the Eliquis 2 days before procedure and restart it 1 day after procedure  ?

## 2022-04-22 NOTE — Telephone Encounter (Signed)
Sent mychart message

## 2022-04-23 ENCOUNTER — Telehealth: Payer: Self-pay | Admitting: Gastroenterology

## 2022-04-23 NOTE — Telephone Encounter (Signed)
Wife left message for you to please return her call she didn't leave any info ?

## 2022-04-23 NOTE — Telephone Encounter (Signed)
Return call in other telephone call  ?

## 2022-04-23 NOTE — Telephone Encounter (Signed)
Called patient cell phone and patient said to call his wife in a hour  ?

## 2022-04-23 NOTE — Telephone Encounter (Signed)
Wife verbalized understanding of instructions  ?

## 2022-04-26 DIAGNOSIS — M5030 Other cervical disc degeneration, unspecified cervical region: Secondary | ICD-10-CM | POA: Diagnosis not present

## 2022-04-26 DIAGNOSIS — M542 Cervicalgia: Secondary | ICD-10-CM | POA: Diagnosis not present

## 2022-04-28 DIAGNOSIS — B379 Candidiasis, unspecified: Secondary | ICD-10-CM | POA: Diagnosis not present

## 2022-04-28 DIAGNOSIS — M542 Cervicalgia: Secondary | ICD-10-CM | POA: Diagnosis not present

## 2022-04-28 DIAGNOSIS — R490 Dysphonia: Secondary | ICD-10-CM | POA: Diagnosis not present

## 2022-05-03 DIAGNOSIS — Z471 Aftercare following joint replacement surgery: Secondary | ICD-10-CM | POA: Diagnosis not present

## 2022-05-03 DIAGNOSIS — Z96641 Presence of right artificial hip joint: Secondary | ICD-10-CM | POA: Diagnosis not present

## 2022-05-05 DIAGNOSIS — M5030 Other cervical disc degeneration, unspecified cervical region: Secondary | ICD-10-CM | POA: Diagnosis not present

## 2022-05-05 DIAGNOSIS — R200 Anesthesia of skin: Secondary | ICD-10-CM | POA: Diagnosis not present

## 2022-05-05 DIAGNOSIS — M542 Cervicalgia: Secondary | ICD-10-CM | POA: Diagnosis not present

## 2022-05-05 DIAGNOSIS — M50323 Other cervical disc degeneration at C6-C7 level: Secondary | ICD-10-CM | POA: Diagnosis not present

## 2022-05-05 DIAGNOSIS — M4722 Other spondylosis with radiculopathy, cervical region: Secondary | ICD-10-CM | POA: Diagnosis not present

## 2022-05-05 DIAGNOSIS — R202 Paresthesia of skin: Secondary | ICD-10-CM | POA: Diagnosis not present

## 2022-05-11 ENCOUNTER — Ambulatory Visit
Admission: RE | Admit: 2022-05-11 | Discharge: 2022-05-11 | Disposition: A | Discharge status: Home / Self Care | Payer: Medicare HMO | Attending: Gastroenterology | Admitting: Gastroenterology

## 2022-05-11 ENCOUNTER — Ambulatory Visit: Payer: Medicare HMO | Admitting: Anesthesiology

## 2022-05-11 ENCOUNTER — Other Ambulatory Visit: Payer: Self-pay

## 2022-05-11 ENCOUNTER — Encounter: Payer: Self-pay | Admitting: Gastroenterology

## 2022-05-11 ENCOUNTER — Encounter
Admission: RE | Disposition: A | Discharge status: Home / Self Care | Payer: Self-pay | Source: Home / Self Care | Attending: Gastroenterology

## 2022-05-11 ENCOUNTER — Telehealth: Payer: Self-pay

## 2022-05-11 DIAGNOSIS — I2510 Atherosclerotic heart disease of native coronary artery without angina pectoris: Secondary | ICD-10-CM | POA: Insufficient documentation

## 2022-05-11 DIAGNOSIS — Z0189 Encounter for other specified special examinations: Secondary | ICD-10-CM

## 2022-05-11 DIAGNOSIS — K635 Polyp of colon: Secondary | ICD-10-CM | POA: Diagnosis not present

## 2022-05-11 DIAGNOSIS — E039 Hypothyroidism, unspecified: Secondary | ICD-10-CM | POA: Insufficient documentation

## 2022-05-11 DIAGNOSIS — K219 Gastro-esophageal reflux disease without esophagitis: Secondary | ICD-10-CM | POA: Diagnosis not present

## 2022-05-11 DIAGNOSIS — M1990 Unspecified osteoarthritis, unspecified site: Secondary | ICD-10-CM | POA: Diagnosis not present

## 2022-05-11 DIAGNOSIS — K921 Melena: Secondary | ICD-10-CM | POA: Diagnosis not present

## 2022-05-11 DIAGNOSIS — E785 Hyperlipidemia, unspecified: Secondary | ICD-10-CM | POA: Diagnosis not present

## 2022-05-11 DIAGNOSIS — Z7901 Long term (current) use of anticoagulants: Secondary | ICD-10-CM | POA: Insufficient documentation

## 2022-05-11 DIAGNOSIS — D123 Benign neoplasm of transverse colon: Secondary | ICD-10-CM | POA: Diagnosis not present

## 2022-05-11 DIAGNOSIS — I10 Essential (primary) hypertension: Secondary | ICD-10-CM | POA: Insufficient documentation

## 2022-05-11 DIAGNOSIS — I252 Old myocardial infarction: Secondary | ICD-10-CM | POA: Diagnosis not present

## 2022-05-11 DIAGNOSIS — K644 Residual hemorrhoidal skin tags: Secondary | ICD-10-CM | POA: Insufficient documentation

## 2022-05-11 DIAGNOSIS — Z87891 Personal history of nicotine dependence: Secondary | ICD-10-CM | POA: Insufficient documentation

## 2022-05-11 HISTORY — PX: COLONOSCOPY WITH PROPOFOL: SHX5780

## 2022-05-11 SURGERY — COLONOSCOPY WITH PROPOFOL
Anesthesia: General

## 2022-05-11 MED ORDER — PROPOFOL 500 MG/50ML IV EMUL
INTRAVENOUS | Status: AC
  Filled 2022-05-11: qty 350

## 2022-05-11 MED ORDER — SODIUM CHLORIDE 0.9 % IV SOLN: INTRAVENOUS | Status: DC

## 2022-05-11 MED ORDER — LIDOCAINE HCL (CARDIAC) PF 100 MG/5ML IV SOSY
PREFILLED_SYRINGE | INTRAVENOUS | Status: DC | PRN
  Administered 2022-05-11: 80 mg via INTRAVENOUS
  Administered 2022-05-11: 20 mg via INTRAVENOUS

## 2022-05-11 MED ORDER — OXYMETAZOLINE HCL 0.05 % NA SOLN
NASAL | Status: AC
  Filled 2022-05-11: qty 30

## 2022-05-11 MED ORDER — PROPOFOL 10 MG/ML IV BOLUS
INTRAVENOUS | Status: DC | PRN
  Administered 2022-05-11: 20 mg via INTRAVENOUS
  Administered 2022-05-11: 30 mg via INTRAVENOUS

## 2022-05-11 MED ORDER — PROPOFOL 500 MG/50ML IV EMUL
INTRAVENOUS | Status: DC | PRN
  Administered 2022-05-11: 155 ug/kg/min via INTRAVENOUS

## 2022-05-11 NOTE — Anesthesia Procedure Notes (Signed)
Procedure Name: General with mask airway Date/Time: 05/11/2022 8:05 AM Performed by: Kelton Pillar, CRNA Pre-anesthesia Checklist: Patient identified, Emergency Drugs available, Suction available and Patient being monitored Patient Re-evaluated:Patient Re-evaluated prior to induction Oxygen Delivery Method: Simple face mask Induction Type: IV induction Placement Confirmation: positive ETCO2, CO2 detector and breath sounds checked- equal and bilateral Dental Injury: Teeth and Oropharynx as per pre-operative assessment

## 2022-05-11 NOTE — H&P (Signed)
Cephas Darby, MD [address]  [place], [place] [address]  Main: [phone]  Fax: [phone] Pager: [phone]  Primary Care Physician:  Tracie Harrier, MD Primary Gastroenterologist:  Dr. Cephas Darby  Pre-Procedure History & Physical: HPI:  Keith Williamson is a 76 y.o. male is here for an colonoscopy.   Past Medical History:  Diagnosis Date   Arthritis    BPH (benign prostatic hypertrophy)    CAD (coronary artery disease)    Diverticulosis    Enlarged prostate    GERD (gastroesophageal reflux disease)    Headache(784.0)    frequent sinus headache   Hurthle cell tumor    Hyperlipidemia    Hypertension    Hypothyroidism    MI (myocardial infarction) ([place]) 2014   inferior; s/p PCI with stent placement to RCA   OA (osteoarthritis)    Retina hole    Senile nuclear sclerosis    Thyroid cancer ([place])    iodine tx   Valvular heart disease     Past Surgical History:  Procedure Laterality Date   ANGIOPLASTY / STENTING FEMORAL     BACK SURGERY     COLONOSCOPY WITH PROPOFOL N/A 05/07/2016   Procedure: COLONOSCOPY WITH PROPOFOL;  Surgeon: Lollie Sails, MD;  Location: [facility] ENDOSCOPY;  Service: Endoscopy;  Laterality: N/A;   ESOPHAGOGASTRODUODENOSCOPY (EGD) WITH PROPOFOL N/A 05/07/2016   Procedure: ESOPHAGOGASTRODUODENOSCOPY (EGD) WITH PROPOFOL;  Surgeon: Lollie Sails, MD;  Location: [facility] ENDOSCOPY;  Service: Endoscopy;  Laterality: N/A;   ESOPHAGOGASTRODUODENOSCOPY (EGD) WITH PROPOFOL N/A 04/16/2022   Procedure: ESOPHAGOGASTRODUODENOSCOPY (EGD) WITH PROPOFOL;  Surgeon: Lin Landsman, MD;  Location: [facility] ENDOSCOPY;  Service: Gastroenterology;  Laterality: N/A;   GREEN LIGHT LASER TURP (TRANSURETHRAL RESECTION OF PROSTATE N/A 01/01/2021   Procedure: GREEN LIGHT LASER TURP (TRANSURETHRAL RESECTION OF PROSTATE;  Surgeon: Royston Cowper, MD;  Location: [facility] ORS;  Service: Urology;  Laterality: N/A;   JOINT REPLACEMENT     KNEE ARTHROSCOPY  Left    Medial and lateral meniscectomy, partial   LUMBAR LAMINECTOMY  08/16/2012   L 3 4 & 5   LUMBAR MICRODISCECTOMY     L3-L4; with partial hemilaminectomy, partial facetectomy and foraminotomy   TOTAL THYROIDECTOMY     TURP VAPORIZATION      Prior to Admission medications   Medication Sig Start Date End Date Taking? Authorizing Provider  apixaban (ELIQUIS) 5 MG TABS tablet Take 5 mg by mouth 2 (two) times daily.   Yes [provider]  candesartan (ATACAND) 32 MG tablet Take 32 mg by mouth daily.   Yes [provider]  dutasteride (AVODART) 0.5 MG capsule Take 0.5 mg by mouth daily.   Yes [provider]  Multiple Vitamins-Minerals (MULTIVITAMIN WITH MINERALS) tablet Take 1 tablet by mouth daily.   Yes [provider]  allopurinol (ZYLOPRIM) 100 MG tablet Take 100 mg by mouth daily.    [provider]  apixaban (ELIQUIS) 5 MG TABS tablet Take 2 tablets (10 mg total) by mouth 2 (two) times daily for 5 days, THEN 1 tablet (5 mg total) 2 (two) times daily. 08/08/21 04/16/22  Nicole Kindred A, DO  colchicine 0.6 MG tablet Take 0.6 mg by mouth as needed.    [provider]  docusate sodium (COLACE) 100 MG capsule Take 2 capsules (200 mg total) by mouth 2 (two) times daily. 01/01/21   Royston Cowper, MD  fluticasone (FLONASE) 50 MCG/ACT nasal spray Place 2 sprays into both nostrils daily.  [provider]  HYDROcodone-acetaminophen (NORCO) 10-325 MG tablet Take 1 tablet by mouth every 6 (six) hours as needed. 04/05/22   [provider]  levothyroxine (SYNTHROID, LEVOTHROID) 137 MCG tablet Take 137 mcg by mouth daily before breakfast.    [provider]  nitroGLYCERIN (NITROSTAT) 0.4 MG SL tablet Place 0.4 mg under the tongue every 5 (five) minutes as needed for chest pain.    [provider]  omeprazole (PRILOSEC) 20 MG capsule Take 1 capsule (20 mg total) by mouth 2 (two) times daily before a meal for 14  days. 04/21/22 05/05/22  Lin Landsman, MD  pantoprazole (PROTONIX) 40 MG tablet Take 1 tablet (40 mg total) by mouth daily. 08/29/21 08/29/22  Harvest Dark, MD  pregabalin (LYRICA) 50 MG capsule Take 50 mg by mouth 2 (two) times daily. 07/10/21   [provider]  rosuvastatin (CRESTOR) 40 MG tablet Take 1 tablet (40 mg total) by mouth at bedtime. 08/08/21   Nicole Kindred A, DO    Allergies as of 04/19/2022 - Review Complete 04/16/2022  Allergen Reaction Noted   Amlodipine Swelling 08/04/2012   Levsin [hyoscyamine] Anaphylaxis, Swelling, and Rash 12/16/2020   Beta adrenergic blockers Other (See Comments) 08/04/2012   Hydralazine  05/06/2016   Hydrochlorothiazide Other (See Comments) 08/04/2012   Other  05/06/2016   Zetia [ezetimibe]  12/16/2020    History reviewed. No pertinent family history.  Social History   Socioeconomic History   Marital status: Married    Spouse name: Not on file   Number of children: Not on file   Years of education: Not on file   Highest education level: Not on file  Occupational History   Not on file  Tobacco Use   Smoking status: Former    Types: Cigarettes    Quit date: 12/17/1981    Years since quitting: 40.4   Smokeless tobacco: Never  Vaping Use   Vaping Use: Never used  Substance and Sexual Activity   Alcohol use: No   Drug use: No   Sexual activity: Not Currently  Other Topics Concern   Not on file  Social History Narrative   Not on file   Social Determinants of Health   Financial Resource Strain: Not on file  Food Insecurity: Not on file  Transportation Needs: Not on file  Physical Activity: Not on file  Stress: Not on file  Social Connections: Not on file  Intimate Partner Violence: Not on file    Review of Systems: See HPI, otherwise negative ROS  Physical Exam: BP (!) 160/115   Pulse 91   Temp 98.9 F (37.2 C) (Temporal)   Resp 18   Ht '6\' 2"'$  (1.88 m)   Wt 90.7 kg   SpO2 100%   BMI 25.68 kg/m   General:   Alert,  pleasant and cooperative in NAD Head:  Normocephalic and atraumatic. Neck:  Supple; no masses or thyromegaly. Lungs:  Clear throughout to auscultation.    Heart:  Regular rate and rhythm. Abdomen:  Soft, nontender and nondistended. Normal bowel sounds, without guarding, and without rebound.   Neurologic:  Alert and  oriented x4;  grossly normal neurologically.  Impression/Plan: Keith Williamson is here for a colonoscopy to be performed for melena, norma EGD  Risks, benefits, limitations, and alternatives regarding  colonoscopy have been reviewed with the patient.  Questions have been answered.  All parties agreeable.   Sherri Sear, MD  05/11/2022, 7:50 AM

## 2022-05-11 NOTE — Telephone Encounter (Signed)
Called patient and patient states his wife will have to give [place] a call back to schedule

## 2022-05-11 NOTE — Telephone Encounter (Signed)
Per Colonoscopy report patient needs to be schedule for a Capsule study for History of Melena

## 2022-05-11 NOTE — Anesthesia Postprocedure Evaluation (Signed)
Anesthesia Post Note  Patient: Keith Williamson  Procedure(s) Performed: COLONOSCOPY WITH PROPOFOL  Patient location during evaluation: Endoscopy Anesthesia Type: General Level of consciousness: awake and alert Pain management: pain level controlled Vital Signs Assessment: post-procedure vital signs reviewed and stable Respiratory status: spontaneous breathing, nonlabored ventilation, respiratory function stable and patient connected to nasal cannula oxygen Cardiovascular status: blood pressure returned to baseline and stable Postop Assessment: no apparent nausea or vomiting Anesthetic complications: no   No notable events documented.   Last Vitals:  Vitals:   05/11/22 0839 05/11/22 0849  BP: (!) 130/95 (!) 141/99  Pulse: 84 79  Resp: 16 19  Temp:    SpO2: 100% 100%    Last Pain:  Vitals:   05/11/22 0849  TempSrc:   PainSc: 0-No pain                 Precious Haws Dionisia Pacholski

## 2022-05-11 NOTE — Anesthesia Preprocedure Evaluation (Signed)
Anesthesia Evaluation  Patient identified by MRN, date of birth, ID band Patient awake    Reviewed: Allergy & Precautions, NPO status , Patient's Chart, lab work & pertinent test results  History of Anesthesia Complications Negative for: history of anesthetic complications  Airway Mallampati: III  TM Distance: >3 FB Neck ROM: full    Dental  (+) Chipped   Pulmonary neg pulmonary ROS, neg shortness of breath, former smoker,    Pulmonary exam normal        Cardiovascular Exercise Tolerance: Good hypertension, (-) angina+ CAD and + Past MI  Normal cardiovascular exam     Neuro/Psych  Headaches, negative psych ROS   GI/Hepatic Neg liver ROS, GERD  Controlled,  Endo/Other  Hypothyroidism   Renal/GU negative Renal ROS  negative genitourinary   Musculoskeletal   Abdominal   Peds  Hematology negative hematology ROS (+)   Anesthesia Other Findings Past Medical History: No date: Arthritis No date: BPH (benign prostatic hypertrophy) No date: CAD (coronary artery disease) No date: Diverticulosis No date: Enlarged prostate No date: GERD (gastroesophageal reflux disease) No date: Headache(784.0)     Comment:  frequent sinus headache No date: Hurthle cell tumor No date: Hyperlipidemia No date: Hypertension No date: Hypothyroidism 2014: MI (myocardial infarction) ([place])     Comment:  inferior; s/p PCI with stent placement to RCA No date: OA (osteoarthritis) No date: Retina hole No date: Senile nuclear sclerosis No date: Thyroid cancer ([place])     Comment:  iodine tx No date: Valvular heart disease  Past Surgical History: No date: ANGIOPLASTY / STENTING FEMORAL No date: BACK SURGERY 05/07/2016: COLONOSCOPY WITH PROPOFOL; N/A     Comment:  Procedure: COLONOSCOPY WITH PROPOFOL;  Surgeon: Lollie Sails, MD;  Location: [facility] ENDOSCOPY;  Service:               Endoscopy;  Laterality: N/A; 05/07/2016:  ESOPHAGOGASTRODUODENOSCOPY (EGD) WITH PROPOFOL; N/A     Comment:  Procedure: ESOPHAGOGASTRODUODENOSCOPY (EGD) WITH               PROPOFOL;  Surgeon: Lollie Sails, MD;  Location:               [facility] ENDOSCOPY;  Service: Endoscopy;  Laterality: N/A; 04/16/2022: ESOPHAGOGASTRODUODENOSCOPY (EGD) WITH PROPOFOL; N/A     Comment:  Procedure: ESOPHAGOGASTRODUODENOSCOPY (EGD) WITH               PROPOFOL;  Surgeon: Lin Landsman, MD;  Location:               [facility] ENDOSCOPY;  Service: Gastroenterology;  Laterality:               N/A; 01/01/2021: GREEN LIGHT LASER TURP (TRANSURETHRAL RESECTION OF  PROSTATE; N/A     Comment:  Procedure: GREEN LIGHT LASER TURP (TRANSURETHRAL               RESECTION OF PROSTATE;  Surgeon: Royston Cowper, MD;                Location: [facility] ORS;  Service: Urology;  Laterality: N/A; No date: JOINT REPLACEMENT No date: KNEE ARTHROSCOPY; Left     Comment:  Medial and lateral meniscectomy, partial 08/16/2012: LUMBAR LAMINECTOMY     Comment:  L 3 4 & 5 No date: LUMBAR MICRODISCECTOMY     Comment:  L3-L4; with partial hemilaminectomy, partial facetectomy  and foraminotomy No date: TOTAL THYROIDECTOMY No date: TURP VAPORIZATION     Reproductive/Obstetrics negative OB ROS                             Anesthesia Physical Anesthesia Plan  ASA: 3  Anesthesia Plan: General   Post-op Pain Management:    Induction: Intravenous  PONV Risk Score and Plan: Propofol infusion and TIVA  Airway Management Planned: Natural Airway and Nasal Cannula  Additional Equipment:   Intra-op Plan:   Post-operative Plan:   Informed Consent: I have reviewed the patients History and Physical, chart, labs and discussed the procedure including the risks, benefits and alternatives for the proposed anesthesia with the patient or authorized representative who has indicated his/her understanding and acceptance.     Dental Advisory  Given  Plan Discussed with: Anesthesiologist, CRNA and Surgeon  Anesthesia Plan Comments: (Patient consented for risks of anesthesia including but not limited to:  - adverse reactions to medications - risk of airway placement if required - damage to eyes, teeth, lips or other oral mucosa - nerve damage due to positioning  - sore throat or hoarseness - Damage to heart, brain, nerves, lungs, other parts of body or loss of life  Patient voiced understanding.)        Anesthesia Quick Evaluation

## 2022-05-11 NOTE — Op Note (Addendum)
[facility] Gastroenterology Patient Name: Keith Williamson Procedure Date: 05/11/2022 7:22 AM MRN: [id number] Account #: [id number] Date of Birth: [birth year] Admit Type: Outpatient Age: 76 Room: [facility] ENDO ROOM 3 Gender: Male Note Status: Finalized Instrument Name: Colonoscope [id number] Procedure:             Colonoscopy Indications:           Last colonoscopy: May 2017, Melena Providers:             Lin Landsman MD, MD Medicines:             General Anesthesia Complications:         No immediate complications. Estimated blood loss:                         Minimal. Procedure:             Pre-Anesthesia Assessment:                        - Prior to the procedure, a History and Physical was                         performed, and patient medications and allergies were                         reviewed. The patient is competent. The risks and                         benefits of the procedure and the sedation options and                         risks were discussed with the patient. All questions                         were answered and informed consent was obtained.                         Patient identification and proposed procedure were                         verified by the physician, the nurse, the                         anesthesiologist, the anesthetist and the technician                         in the pre-procedure area in the procedure room in the                         endoscopy suite. Mental Status Examination: alert and                         oriented. Airway Examination: normal oropharyngeal                         airway and neck mobility. Respiratory Examination:                         clear to auscultation. CV Examination: normal.  Prophylactic Antibiotics: The patient does not require                         prophylactic antibiotics. Prior Anticoagulants: The                         patient has taken Eliquis (apixaban),  last dose was 5                         days prior to procedure. ASA Grade Assessment: III - A                         patient with severe systemic disease. After reviewing                         the risks and benefits, the patient was deemed in                         satisfactory condition to undergo the procedure. The                         anesthesia plan was to use general anesthesia.                         Immediately prior to administration of medications,                         the patient was re-assessed for adequacy to receive                         sedatives. The heart rate, respiratory rate, oxygen                         saturations, blood pressure, adequacy of pulmonary                         ventilation, and response to care were monitored                         throughout the procedure. The physical status of the                         patient was re-assessed after the procedure.                        After obtaining informed consent, the colonoscope was                         passed under direct vision. Throughout the procedure,                         the patient's blood pressure, pulse, and oxygen                         saturations were monitored continuously. The                         Colonoscope was introduced through the anus and  advanced to the the terminal ileum, with                         identification of the appendiceal orifice and IC                         valve. The colonoscopy was performed without                         difficulty. The patient tolerated the procedure well.                         The quality of the bowel preparation was evaluated                         using the BBPS [facility] Bowel Preparation Scale) with                         scores of: Right Colon = 3, Transverse Colon = 3 and                         Left Colon = 3 (entire mucosa seen well with no                         residual staining, small  fragments of stool or opaque                         liquid). The total BBPS score equals 9. Findings:      Hemorrhoids were found on perianal exam.      The terminal ileum appeared normal.      A 8 mm friable polyp was found in the transverse colon. The polyp was       semi-pedunculated. The polyp was removed with a cold snare. Resection       and retrieval were complete. To prevent bleeding after the polypectomy,       one hemostatic clip was successfully placed. There was no bleeding at       the end of the procedure.      Non-bleeding external hemorrhoids were found during retroflexion. The       hemorrhoids were large. Impression:            - Hemorrhoids found on perianal exam.                        - The examined portion of the ileum was normal.                        - One 8 mm polyp in the transverse colon, removed with                         a cold snare. Resected and retrieved. Clip was placed.                        - Non-bleeding external hemorrhoids. Recommendation:        - Discharge patient to home (with escort).                        -  Resume previous diet today.                        - Continue present medications.                        - Await pathology results.                        - To visualize the small bowel, perform video capsule                         endoscopy at appointment to be scheduled for h/o                         melena.                        - Resume Eliquis (apixaban) tomorrow at prior dose.                         Refer to managing physician for further adjustment of                         therapy. Procedure Code(s):     --- Professional ---                        [phone], Colonoscopy, flexible; with removal of                         tumor(s), polyp(s), or other lesion(s) by snare                         technique Diagnosis Code(s):     --- Professional ---                        K64.4, Residual hemorrhoidal skin tags                         K92.1, Melena (includes Hematochezia)                        K63.5, Polyp of colon CPT copyright 2019 American Medical Association. All rights reserved. The codes documented in this report are preliminary and upon coder review may  be revised to meet current compliance requirements. Dr. Ulyess Mort Lin Landsman MD, MD 05/11/2022 8:19:25 AM This report has been signed electronically. Number of Addenda: 0 Note Initiated On: 05/11/2022 7:22 AM Scope Withdrawal Time: 0 hours 14 minutes 25 seconds  Total Procedure Duration: 0 hours 19 minutes 13 seconds  Estimated Blood Loss:  Estimated blood loss was minimal.      [facility]

## 2022-05-11 NOTE — Transfer of Care (Signed)
Immediate Anesthesia Transfer of Care Note  Patient: Ketrick Matney  Procedure(s) Performed: COLONOSCOPY WITH PROPOFOL  Patient Location: Endoscopy Unit  Anesthesia Type:General  Level of Consciousness: drowsy and patient cooperative  Airway & Oxygen Therapy: Patient Spontanous Breathing and Patient connected to face mask oxygen  Post-op Assessment: Report given to RN and Post -op Vital signs reviewed and stable  Post vital signs: Reviewed and stable  Last Vitals:  Vitals Value Taken Time  BP 97/69 05/11/22 0819  Temp 36.4 C 05/11/22 0819  Pulse 83 05/11/22 0819  Resp 14 05/11/22 0819  SpO2 100 % 05/11/22 0819  Vitals shown include unvalidated device data.  Last Pain:  Vitals:   05/11/22 0819  TempSrc: Temporal  PainSc:          Complications: No notable events documented.

## 2022-05-12 ENCOUNTER — Encounter: Payer: Self-pay | Admitting: Gastroenterology

## 2022-05-12 ENCOUNTER — Other Ambulatory Visit: Payer: Self-pay

## 2022-05-12 DIAGNOSIS — Z8719 Personal history of other diseases of the digestive system: Secondary | ICD-10-CM

## 2022-05-12 LAB — SURGICAL PATHOLOGY

## 2022-05-12 NOTE — Telephone Encounter (Signed)
Called patient wife and got patient schedule for 05/26/2022. Went over instructions and sent them to [employer] and mailed

## 2022-05-13 ENCOUNTER — Telehealth: Payer: Self-pay

## 2022-05-13 NOTE — Telephone Encounter (Signed)
Patient wife called and wants to move capsule study to 06/01/2022. Moved procedure by calling ENDO and talking to [facility]. Sent new instructions to patient on mychart

## 2022-05-17 DIAGNOSIS — M542 Cervicalgia: Secondary | ICD-10-CM | POA: Diagnosis not present

## 2022-05-24 DIAGNOSIS — M542 Cervicalgia: Secondary | ICD-10-CM | POA: Diagnosis not present

## 2022-05-27 DIAGNOSIS — M542 Cervicalgia: Secondary | ICD-10-CM | POA: Diagnosis not present

## 2022-05-28 DIAGNOSIS — R490 Dysphonia: Secondary | ICD-10-CM | POA: Diagnosis not present

## 2022-05-28 DIAGNOSIS — J328 Other chronic sinusitis: Secondary | ICD-10-CM | POA: Diagnosis not present

## 2022-05-31 ENCOUNTER — Encounter: Payer: Self-pay | Admitting: Gastroenterology

## 2022-06-01 ENCOUNTER — Encounter
Admission: RE | Disposition: A | Discharge status: Home / Self Care | Payer: Self-pay | Source: Home / Self Care | Attending: Gastroenterology

## 2022-06-01 ENCOUNTER — Ambulatory Visit
Admission: RE | Admit: 2022-06-01 | Discharge: 2022-06-01 | Disposition: A | Discharge status: Home / Self Care | Payer: Medicare HMO | Attending: Gastroenterology | Admitting: Gastroenterology

## 2022-06-01 DIAGNOSIS — Z8719 Personal history of other diseases of the digestive system: Secondary | ICD-10-CM | POA: Diagnosis not present

## 2022-06-01 DIAGNOSIS — K921 Melena: Secondary | ICD-10-CM | POA: Insufficient documentation

## 2022-06-01 DIAGNOSIS — D175 Benign lipomatous neoplasm of intra-abdominal organs: Secondary | ICD-10-CM | POA: Diagnosis not present

## 2022-06-01 HISTORY — PX: GIVENS CAPSULE STUDY: SHX5432

## 2022-06-01 SURGERY — IMAGING PROCEDURE, GI TRACT, INTRALUMINAL, VIA CAPSULE

## 2022-06-02 ENCOUNTER — Encounter: Payer: Self-pay | Admitting: Gastroenterology

## 2022-06-03 DIAGNOSIS — M542 Cervicalgia: Secondary | ICD-10-CM | POA: Diagnosis not present

## 2022-06-07 ENCOUNTER — Telehealth: Payer: Self-pay | Admitting: Gastroenterology

## 2022-06-07 DIAGNOSIS — Z8719 Personal history of other diseases of the digestive system: Secondary | ICD-10-CM

## 2022-06-07 NOTE — Telephone Encounter (Signed)
Patient had capsule study on 06/01/2022

## 2022-06-08 ENCOUNTER — Telehealth: Payer: Self-pay | Admitting: Gastroenterology

## 2022-06-08 DIAGNOSIS — M542 Cervicalgia: Secondary | ICD-10-CM | POA: Diagnosis not present

## 2022-06-08 DIAGNOSIS — A048 Other specified bacterial intestinal infections: Secondary | ICD-10-CM | POA: Diagnosis not present

## 2022-06-08 DIAGNOSIS — B9681 Helicobacter pylori [H. pylori] as the cause of diseases classified elsewhere: Secondary | ICD-10-CM | POA: Diagnosis not present

## 2022-06-09 LAB — H. PYLORI BREATH TEST: H pylori Breath Test: NEGATIVE

## 2022-06-10 ENCOUNTER — Telehealth: Payer: Self-pay

## 2022-06-10 DIAGNOSIS — M542 Cervicalgia: Secondary | ICD-10-CM | POA: Diagnosis not present

## 2022-06-10 NOTE — Telephone Encounter (Signed)
-----   Message from Lin Landsman, MD sent at 06/09/2022  5:20 PM EDT ----- Please inform patient that his H. pylori breath test is negative which confirms eradication of Helicobacter pylori infection.  RV

## 2022-06-10 NOTE — Telephone Encounter (Signed)
Patient wife verbalized understanding of results  ?

## 2022-06-11 ENCOUNTER — Ambulatory Visit: Admission: RE | Admit: 2022-06-11 | Payer: Medicare HMO | Source: Ambulatory Visit | Admitting: Gastroenterology

## 2022-06-11 ENCOUNTER — Ambulatory Visit
Admission: RE | Admit: 2022-06-11 | Discharge: 2022-06-11 | Disposition: A | Discharge status: Home / Self Care | Payer: Medicare HMO | Source: Ambulatory Visit | Attending: Gastroenterology | Admitting: Gastroenterology

## 2022-06-11 DIAGNOSIS — Z8719 Personal history of other diseases of the digestive system: Secondary | ICD-10-CM | POA: Insufficient documentation

## 2022-06-11 DIAGNOSIS — Z9889 Other specified postprocedural states: Secondary | ICD-10-CM | POA: Diagnosis not present

## 2022-06-16 DIAGNOSIS — M542 Cervicalgia: Secondary | ICD-10-CM | POA: Diagnosis not present

## 2022-06-17 DIAGNOSIS — I10 Essential (primary) hypertension: Secondary | ICD-10-CM | POA: Diagnosis not present

## 2022-06-17 DIAGNOSIS — R21 Rash and other nonspecific skin eruption: Secondary | ICD-10-CM | POA: Diagnosis not present

## 2022-06-18 DIAGNOSIS — M542 Cervicalgia: Secondary | ICD-10-CM | POA: Diagnosis not present

## 2022-06-22 DIAGNOSIS — M542 Cervicalgia: Secondary | ICD-10-CM | POA: Diagnosis not present

## 2022-06-23 ENCOUNTER — Ambulatory Visit: Admit: 2022-06-23 | Payer: Medicare HMO | Admitting: Internal Medicine

## 2022-06-23 ENCOUNTER — Encounter: Payer: Self-pay | Admitting: Speech Pathology

## 2022-06-23 ENCOUNTER — Ambulatory Visit: Payer: Medicare HMO | Attending: Otolaryngology | Admitting: Speech Pathology

## 2022-06-23 DIAGNOSIS — R490 Dysphonia: Secondary | ICD-10-CM | POA: Diagnosis not present

## 2022-06-23 SURGERY — COLONOSCOPY WITH PROPOFOL
Anesthesia: General

## 2022-06-23 NOTE — Therapy (Addendum)
OUTPATIENT SPEECH LANGUAGE PATHOLOGY VOICE EVALUATION   Patient Name: Eleazar Kimmey MRN: [id number] DOB:July 11, 1946, 76 y.o., male Today's Date: 06/23/2022  PCP: Tracie Harrier, MD  REFERRING PROVIDER: Carloyn Manner, MD   End of Session - 06/23/22 2050     Visit Number 1    Number of Visits 17    Date for SLP Re-Evaluation 08/18/22    Authorization Type [employer] HMO    Progress Note Due on Visit 10    SLP Start Time 49    SLP Stop Time  1445    SLP Time Calculation (min) 45 min    Activity Tolerance Patient tolerated treatment well             Past Medical History:  Diagnosis Date   Arthritis    BPH (benign prostatic hypertrophy)    CAD (coronary artery disease)    Diverticulosis    Enlarged prostate    GERD (gastroesophageal reflux disease)    Headache(784.0)    frequent sinus headache   Hurthle cell tumor    Hyperlipidemia    Hypertension    Hypothyroidism    MI (myocardial infarction) ([place]) 2014   inferior; s/p PCI with stent placement to RCA   OA (osteoarthritis)    Retina hole    Senile nuclear sclerosis    Thyroid cancer ([place])    iodine tx   Valvular heart disease    Past Surgical History:  Procedure Laterality Date   ANGIOPLASTY / STENTING FEMORAL     BACK SURGERY     COLONOSCOPY WITH PROPOFOL N/A 05/07/2016   Procedure: COLONOSCOPY WITH PROPOFOL;  Surgeon: Lollie Sails, MD;  Location: [facility] ENDOSCOPY;  Service: Endoscopy;  Laterality: N/A;   COLONOSCOPY WITH PROPOFOL N/A 05/11/2022   Procedure: COLONOSCOPY WITH PROPOFOL;  Surgeon: Lin Landsman, MD;  Location: [facility] ENDOSCOPY;  Service: Gastroenterology;  Laterality: N/A;   ESOPHAGOGASTRODUODENOSCOPY (EGD) WITH PROPOFOL N/A 05/07/2016   Procedure: ESOPHAGOGASTRODUODENOSCOPY (EGD) WITH PROPOFOL;  Surgeon: Lollie Sails, MD;  Location[facility] ENDOSCOPY;  Service: Endoscopy;  Laterality: N/A;   ESOPHAGOGASTRODUODENOSCOPY (EGD) WITH PROPOFOL N/A 04/16/2022   Procedure:  ESOPHAGOGASTRODUODENOSCOPY (EGD) WITH PROPOFOL;  Surgeon: Lin Landsman, MD;  Location: [facility] ENDOSCOPY;  Service: Gastroenterology;  Laterality: N/A;   GIVENS CAPSULE STUDY N/A 06/01/2022   Procedure: GIVENS CAPSULE STUDY;  Surgeon: Lin Landsman, MD;  Location: [facility] ENDOSCOPY;  Service: Gastroenterology;  Laterality: N/A;   GREEN LIGHT LASER TURP (TRANSURETHRAL RESECTION OF PROSTATE N/A 01/01/2021   Procedure: GREEN LIGHT LASER TURP (TRANSURETHRAL RESECTION OF PROSTATE;  Surgeon: Royston Cowper, MD;  Location: [facility] ORS;  Service: Urology;  Laterality: N/A;   JOINT REPLACEMENT     KNEE ARTHROSCOPY Left    Medial and lateral meniscectomy, partial   LUMBAR LAMINECTOMY  08/16/2012   L 3 4 & 5   LUMBAR MICRODISCECTOMY     L3-L4; with partial hemilaminectomy, partial facetectomy and foraminotomy   TOTAL THYROIDECTOMY     TURP VAPORIZATION     Patient Active Problem List   Diagnosis Date Noted   Normal esophagogastroduodenoscopy (EGD)    Polyp of transverse colon    Orthostatic hypotension 04/16/2022   Melena 04/16/2022   Dyslipidemia 04/16/2022   Hypothyroidism, unspecified 04/16/2022   BPH (benign prostatic hyperplasia) 04/16/2022   Paroxysmal atrial fibrillation ([place]) 04/16/2022   Pressure injury of skin 04/16/2022   Upper abdominal pain    Essential hypertension 08/06/2021   Hyperlipidemia 08/06/2021   Hypothyroid 08/06/2021   COVID-19 virus infection 08/06/2021  Syncope 05/31/2021   Right leg DVT ([place]) 05/31/2021   Spinal stenosis, lumbar 08/07/2012    Onset date: 06/09/2022  REFERRING DIAG: R49.0 (ICD-10-CM) - Dysphonia  THERAPY DIAG:  Dysphonia Muscle Tension Dysphonia  Rationale for Evaluation and Treatment Rehabilitation  SUBJECTIVE:   SUBJECTIVE STATEMENT: "His voice didn't sound like this before he had COVID" Pt accompanied by: significant other  PERTINENT HISTORY: Pt is a 76 year old male with past medical history of reflux, hiatal hernia (EGD  04/15/2022), thyroidectomy, hearing loss, COVID (August 2022), thrush (04/28/2022)  DIAGNOSTIC FINDINGS: CT Soft Tissue neck: negative; Strobovideolaryngoscopy (04/28/2022) - thrush, treated with diflucan; Laryngeal anatomy: pachydermia and supraglottic tension (medial); Supraglottic hyperfunction: mild lateral; Glottic closure: intermittently incomplete; Strobovideolaryngoscopy (05/28/2022) - Mild bilateral bowing of vocal cords and bilateral muscle tension dysphonia with touching of false vocal folds with phonation; Laryngeal anatomy: supraglottic tension (A-p), Supraglottic hyperfunction: mild lateral    PAIN:  Are you having pain? No   FALLS: Has patient fallen in last 6 months? No  LIVING ENVIRONMENT: Lives with: lives with their family and lives with their spouse Lives in: House/apartment  PLOF: Independent  PATIENT GOALS to be able to talk better  OBJECTIVE:    COGNITION: Overall cognitive status: Within functional limits for tasks assessed  SOCIAL HISTORY: Occupation: Arts development officer intake: suboptimal Caffeine/alcohol intake: minimal Daily voice use: moderate Environmental risks: None reported Occupational risks: None identified Misuse: Speaks on residual capacity Phonotraumatic behaviors: Excessive and/or habitual throat clearing  PERCEPTUAL VOICE ASSESSMENT: Voice quality: hoarse, low vocal intensity, and vocal fatigue Vocal abuse: habitual throat clearing and abnormal breathing pattern Resonance: normal Respiratory function: clavicular breathing  OBJECTIVE VOICE ASSESSMENT: Sustained "ah" maximum phonation time: 3.7 seconds Sustained "ah" loudness average: 81 dB Average fundamental frequency during sustained "ah":133 Hz   (.5 SD below average of  145 Hz +/- 23 for gender)  Oral reading (passage) loudness average: 81 dB Conversational pitch average: 133.3 Hz Highest dynamic pitch in conversational speech: 71 Hz Lowest dynamic pitch in conversational speech:  240 Hz Conversational pitch range: 170 Hz Conversational loudness average: 76 dB S/z ratio: 1 (Suggestive of dysfunction >1.0) Voice quality: hoarse, rough, strained, low vocal intensity, and vocal fatigue   ORAL MOTOR EXAMINATION Facial : WFL Lingual: WFL Velum: WFL Mandible: WFL Cough: WFL Voice: Hoarse, Weak, Wet    PATIENT REPORTED OUTCOME MEASURES (PROM): VHI Score: The Voice Handicap Index is comprised of a series of questions to assess the patient's perception of their voice. It is designed to evaluate the emotional, physical and functional components of the voice problem.  Functional: 45 Physical: 38 Emotional: 39 Total: 122 (Normal mean 8.75, SD =14.97) z score = 7.6 - severe = 3.00+    PATIENT EDUCATION: Education details: results of this assessment, ST POC Person educated: Patient and Spouse Education method: Explanation Education comprehension: verbalized understanding and needs further education    GOALS: Goals reviewed with patient? Yes  SHORT TERM GOALS: Target date: 10 sessions  The patient will demonstrate abdominal breathing patterns and steady release of breath on exhalation to optimize efficiency of voicing and decrease laryngeal hyperfunction. Baseline: sub-optimal Goal status: INITIAL  2.  The patient will increase hydration for an eventual goal of 6-8 glasses per day and limit caffeine intake (to maximum of 1-2, 8 oz cups/day), as measured by patient report. Baseline: 4 glasses Goal status: INITIAL  3.   The patient will eliminate phonotraumatic behaviors such as chronic throat clearing, by substituting non-traumatic methods to clear mucus.  Baseline: habitual throat clearing Goal status: INITIAL  4.   The patient will utilize a forward tone focused/resonant voice to decrease vocal hyperfunction and improve voice quality and vocal projection.  Baseline: vocal fold hyperfunction Goal status: INITIAL   LONG TERM GOALS: Target date: 08/18/2022      The patient will eliminate phonotraumatic behaviors such as chronic throat clearing, by substituting non-traumatic methods to clear mucus. Baseline: new goal Goal status: INITIAL  2.  The patient will be independent for abdominal breathing and breath support exercises. Baseline: new goal Goal status: INITIAL  3.  The patient will demonstrate independent understanding of vocal hygiene concepts. Baseline: new goal Goal status: INITIAL  4.  The patient will maintain relaxed phonation for paragraph length recitation with 80% accuracy.  Baseline: new goal Goal status: INITIAL   ASSESSMENT:  CLINICAL IMPRESSION: Patient is a 76 y.o. male who was seen today for voice evaluation d/t concern for moderate hoarseness that is described as loss of voice, raspy, strained, and weak.    OBJECTIVE IMPAIRMENTS include voice disorder. These impairments are limiting patient from effectively communicating at home and in community. Patient will benefit from skilled SLP services to address above impairments and improve overall function.  REHAB POTENTIAL: Good  PLAN: SLP FREQUENCY: 1-2x/week  SLP DURATION: 8 weeks  PLANNED INTERVENTIONS: SLP instruction and feedback and Patient/family education    Avacyn Kloosterman B. Rutherford Nail, M.S., CCC-SLP, Mining engineer Certified Brain Injury [place]  [facility] [phone] Ascom [phone] Fax [phone]

## 2022-06-24 DIAGNOSIS — M542 Cervicalgia: Secondary | ICD-10-CM | POA: Diagnosis not present

## 2022-06-25 DIAGNOSIS — L298 Other pruritus: Secondary | ICD-10-CM | POA: Diagnosis not present

## 2022-06-28 ENCOUNTER — Ambulatory Visit: Payer: Medicare HMO | Admitting: Speech Pathology

## 2022-06-28 DIAGNOSIS — R490 Dysphonia: Secondary | ICD-10-CM

## 2022-06-28 NOTE — Addendum Note (Signed)
Addended by: Stormy Fabian B on: 06/28/2022 12:19 PM   Modules accepted: Orders

## 2022-06-29 NOTE — Therapy (Signed)
OUTPATIENT SPEECH LANGUAGE PATHOLOGY TREATMENT NOTE   Patient Name: Keith Williamson MRN: [id number] DOB:12/28/1945, 76 y.o., male Today's Date: 06/29/2022  PCP: Tracie Harrier, MD REFERRING PROVIDER: Carloyn Manner, MD  END OF SESSION:   End of Session - 06/29/22 1655     Visit Number 2    Number of Visits 17    Date for SLP Re-Evaluation 08/18/22    Authorization Type [employer] HMO    Progress Note Due on Visit 10    SLP Start Time 1500    SLP Stop Time  1600    SLP Time Calculation (min) 60 min    Activity Tolerance Patient tolerated treatment well             Past Medical History:  Diagnosis Date   Arthritis    BPH (benign prostatic hypertrophy)    CAD (coronary artery disease)    Diverticulosis    Enlarged prostate    GERD (gastroesophageal reflux disease)    Headache(784.0)    frequent sinus headache   Hurthle cell tumor    Hyperlipidemia    Hypertension    Hypothyroidism    MI (myocardial infarction) ([place]) 2014   inferior; s/p PCI with stent placement to RCA   OA (osteoarthritis)    Retina hole    Senile nuclear sclerosis    Thyroid cancer ([place])    iodine tx   Valvular heart disease    Past Surgical History:  Procedure Laterality Date   ANGIOPLASTY / STENTING FEMORAL     BACK SURGERY     COLONOSCOPY WITH PROPOFOL N/A 05/07/2016   Procedure: COLONOSCOPY WITH PROPOFOL;  Surgeon: Lollie Sails, MD;  Location: [facility] ENDOSCOPY;  Service: Endoscopy;  Laterality: N/A;   COLONOSCOPY WITH PROPOFOL N/A 05/11/2022   Procedure: COLONOSCOPY WITH PROPOFOL;  Surgeon: Lin Landsman, MD;  Location: [facility] ENDOSCOPY;  Service: Gastroenterology;  Laterality: N/A;   ESOPHAGOGASTRODUODENOSCOPY (EGD) WITH PROPOFOL N/A 05/07/2016   Procedure: ESOPHAGOGASTRODUODENOSCOPY (EGD) WITH PROPOFOL;  Surgeon: Lollie Sails, MD;  Location: [facility] ENDOSCOPY;  Service: Endoscopy;  Laterality: N/A;   ESOPHAGOGASTRODUODENOSCOPY (EGD) WITH PROPOFOL N/A 04/16/2022    Procedure: ESOPHAGOGASTRODUODENOSCOPY (EGD) WITH PROPOFOL;  Surgeon: Lin Landsman, MD;  Location: [facility] ENDOSCOPY;  Service: Gastroenterology;  Laterality: N/A;   GIVENS CAPSULE STUDY N/A 06/01/2022   Procedure: GIVENS CAPSULE STUDY;  Surgeon: Lin Landsman, MD;  Location: [facility] ENDOSCOPY;  Service: Gastroenterology;  Laterality: N/A;   GREEN LIGHT LASER TURP (TRANSURETHRAL RESECTION OF PROSTATE N/A 01/01/2021   Procedure: GREEN LIGHT LASER TURP (TRANSURETHRAL RESECTION OF PROSTATE;  Surgeon: Royston Cowper, MD;  Location: [facility] ORS;  Service: Urology;  Laterality: N/A;   JOINT REPLACEMENT     KNEE ARTHROSCOPY Left    Medial and lateral meniscectomy, partial   LUMBAR LAMINECTOMY  08/16/2012   L 3 4 & 5   LUMBAR MICRODISCECTOMY     L3-L4; with partial hemilaminectomy, partial facetectomy and foraminotomy   TOTAL THYROIDECTOMY     TURP VAPORIZATION     Patient Active Problem List   Diagnosis Date Noted   Normal esophagogastroduodenoscopy (EGD)    Polyp of transverse colon    Orthostatic hypotension 04/16/2022   Melena 04/16/2022   Dyslipidemia 04/16/2022   Hypothyroidism, unspecified 04/16/2022   BPH (benign prostatic hyperplasia) 04/16/2022   Paroxysmal atrial fibrillation ([place]) 04/16/2022   Pressure injury of skin 04/16/2022   Upper abdominal pain    Essential hypertension 08/06/2021   Hyperlipidemia 08/06/2021   Hypothyroid 08/06/2021   COVID-19  virus infection 08/06/2021   Syncope 05/31/2021   Right leg DVT ([place]) 05/31/2021   Spinal stenosis, lumbar 08/07/2012    ONSET DATE: 04/28/2022  REFERRING DIAG: R49.0 (ICD-10-CM) - Dysphonia  PERTINENT HISTORY: Pt is a 76 year old male with past medical history of reflux, hiatal hernia (EGD 04/15/2022), thyroidectomy, hearing loss, COVID (August 2022), thrush (04/28/2022)   DIAGNOSTIC FINDINGS: CT Soft Tissue neck: negative; Strobovideolaryngoscopy (04/28/2022) - thrush, treated with diflucan; Laryngeal anatomy: pachydermia  and supraglottic tension (medial); Supraglottic hyperfunction: mild lateral; Glottic closure: intermittently incomplete; Strobovideolaryngoscopy (05/28/2022) - Mild bilateral bowing of vocal cords and bilateral muscle tension dysphonia with touching of false vocal folds with phonation; Laryngeal anatomy: supraglottic tension (A-p), Supraglottic hyperfunction: mild lateral  THERAPY DIAG:  Dysphonia  Muscle tension dysphonia  Rationale for Evaluation and Treatment Rehabilitation  SUBJECTIVE: pt pleasant, has hearing aids in   Pt accompanied by: significant other  PAIN:  Are you having pain? No  PATIENT GOALS: to be able to project his voice for participation in meetings and church activities  OBJECTIVE:   TODAY'S TREATMENT: Skilled treatment session focused on vocal hygiene, abdominal breathing, flow phonation. SLP facilitated the session by providing verbal and written information in habitual throat clears, hydration and mucus management. In addition, SLP instructed pt in abdominal breathing by providing verbal, visual and tactile assistance. Pt was not able to perform x 10 attempts. Suggested pt attempt in supine at home. Finally SLP facilitated session by instructing pt in flow phonation (unvoiced articulated). Pt had difficulty hearing the voiceless productions as well as producing the mouth movements without evidence of significant facial and neck tension.   PATIENT EDUCATION: Education details: Insurance underwriter, abdominal breathing Person educated: Patient and Spouse Education method: Explanation, Demonstration, Tactile cues, Verbal cues, and Handouts Education comprehension: needs further education  GOALS: Goals reviewed with patient? Yes  SHORT TERM GOALS: Target date: 10 sessions   The patient will demonstrate abdominal breathing patterns and steady release of breath on exhalation to optimize efficiency of voicing and decrease laryngeal hyperfunction. Baseline:  sub-optimal Goal status: INITIAL   2.  The patient will increase hydration for an eventual goal of 6-8 glasses per day and limit caffeine intake (to maximum of 1-2, 8 oz cups/day), as measured by patient report. Baseline: 4 glasses Goal status: INITIAL   3.   The patient will eliminate phonotraumatic behaviors such as chronic throat clearing, by substituting non-traumatic methods to clear mucus. Baseline: habitual throat clearing Goal status: INITIAL   4.   The patient will utilize a forward tone focused/resonant voice to decrease vocal hyperfunction and improve voice quality and vocal projection.  Baseline: vocal fold hyperfunction Goal status: INITIAL  LONG TERM GOALS: Target date: 08/18/2022      The patient will eliminate phonotraumatic behaviors such as chronic throat clearing, by substituting non-traumatic methods to clear mucus. Baseline: new goal Goal status: INITIAL   2.  The patient will be independent for abdominal breathing and breath support exercises. Baseline: new goal Goal status: INITIAL   3.  The patient will demonstrate independent understanding of vocal hygiene concepts. Baseline: new goal Goal status: INITIAL   4.  The patient will maintain relaxed phonation for paragraph length recitation with 80% accuracy.  Baseline: new goal Goal status: INITIAL    ASSESSMENT:  CLINICAL IMPRESSION: Pt presents with moderate dysphonia that sounds strained, low intensity and reduced speech intelligibility (~ 75% at the conversation level). Unfortunately, pt's hearing loss and the habitual nature of his dysphonia inhibited  progress during today's session.   OBJECTIVE IMPAIRMENTS include voice disorder. These impairments are limiting patient from household responsibilities and effectively communicating at home and in community. Factors affecting potential to achieve goals and functional outcome are severity of impairments. Patient will benefit from skilled SLP services to  address above impairments and improve overall function.  REHAB POTENTIAL: Good  PLAN: SLP FREQUENCY: 2x/week  SLP DURATION: 12 weeks  PLANNED INTERVENTIONS: SLP instruction and feedback and Patient/family education   Psalm Arman B. Rutherford Nail, M.S., CCC-SLP, Mining engineer Certified Brain Injury [place]  [place] [facility] [phone] Ascom [phone] Fax [phone]

## 2022-06-30 ENCOUNTER — Ambulatory Visit: Payer: Medicare HMO | Admitting: Speech Pathology

## 2022-06-30 DIAGNOSIS — R490 Dysphonia: Secondary | ICD-10-CM | POA: Diagnosis not present

## 2022-07-01 NOTE — Therapy (Signed)
OUTPATIENT SPEECH LANGUAGE PATHOLOGY TREATMENT NOTE   Patient Name: Keith Williamson MRN: [id number] DOB:1946/12/02, 76 y.o., male Today's Date: 07/01/2022  PCP: Tracie Harrier, MD REFERRING PROVIDER: Carloyn Manner, MD  END OF SESSION:   End of Session - 07/01/22 1245     Visit Number 3    Number of Visits 17    Date for SLP Re-Evaluation 08/18/22    Authorization Type [employer] H[employer]    Progress Note Due on Visit 10    SLP Start Time 1500    SLP Stop Time  1600    SLP Time Calculation (min) 60 min    Activity Tolerance Patient tolerated treatment well             Past Medical History:  Diagnosis Date   Arthritis    BPH (benign prostatic hypertrophy)    CAD (coronary artery disease)    Diverticulosis    Enlarged prostate    GERD (gastroesophageal reflux disease)    Headache(784.0)    frequent sinus headache   Hurthle cell tumor    Hyperlipidemia    Hypertension    Hypothyroidism    MI (myocardial infarction) ([place]) 2014   inferior; s/p PCI with stent placement to RCA   OA (osteoarthritis)    Retina hole    Senile nuclear sclerosis    Thyroid cancer ([place])    iodine tx   Valvular heart disease    Past Surgical History:  Procedure Laterality Date   ANGIOPLASTY / STENTING FEMORAL     BACK SURGERY     COLONOSCOPY WITH PROPOFOL N/A 05/07/2016   Procedure: COLONOSCOPY WITH PROPOFOL;  Surgeon: Lollie Sails, MD;  Location: [facility] ENDOSCOPY;  Service: Endoscopy;  Laterality: N/A;   COLONOSCOPY WITH PROPOFOL N/A 05/11/2022   Procedure: COLONOSCOPY WITH PROPOFOL;  Surgeon: Lin Landsman, MD;  Location: [facility] ENDOSCOPY;  Service: Gastroenterology;  Laterality: N/A;   ESOPHAGOGASTRODUODENOSCOPY (EGD) WITH PROPOFOL N/A 05/07/2016   Procedure: ESOPHAGOGASTRODUODENOSCOPY (EGD) WITH PROPOFOL;  Surgeon: Lollie Sails, MD;  Location: [facility] ENDOSCOPY;  Service: Endoscopy;  Laterality: N/A;   ESOPHAGOGASTRODUODENOSCOPY (EGD) WITH PROPOFOL N/A 04/16/2022    Procedure: ESOPHAGOGASTRODUODENOSCOPY (EGD) WITH PROPOFOL;  Surgeon: Lin Landsman, MD;  Location: [facility] ENDOSCOPY;  Service: Gastroenterology;  Laterality: N/A;   GIVENS CAPSULE STUDY N/A 06/01/2022   Procedure: GIVENS CAPSULE STUDY;  Surgeon: Lin Landsman, MD;  Location: [facility] ENDOSCOPY;  Service: Gastroenterology;  Laterality: N/A;   GREEN LIGHT LASER TURP (TRANSURETHRAL RESECTION OF PROSTATE N/A 01/01/2021   Procedure: GREEN LIGHT LASER TURP (TRANSURETHRAL RESECTION OF PROSTATE;  Surgeon: Royston Cowper, MD;  Location: [facility] ORS;  Service: Urology;  Laterality: N/A;   JOINT REPLACEMENT     KNEE ARTHROSCOPY Left    Medial and lateral meniscectomy, partial   LUMBAR LAMINECTOMY  08/16/2012   L 3 4 & 5   LUMBAR MICRODISCECTOMY     L3-L4; with partial hemilaminectomy, partial facetectomy and foraminotomy   TOTAL THYROIDECTOMY     TURP VAPORIZATION     Patient Active Problem List   Diagnosis Date Noted   Normal esophagogastroduodenoscopy (EGD)    Polyp of transverse colon    Orthostatic hypotension 04/16/2022   Melena 04/16/2022   Dyslipidemia 04/16/2022   Hypothyroidism, unspecified 04/16/2022   BPH (benign prostatic hyperplasia) 04/16/2022   Paroxysmal atrial fibrillation ([place]) 04/16/2022   Pressure injury of skin 04/16/2022   Upper abdominal pain    Essential hypertension 08/06/2021   Hyperlipidemia 08/06/2021   Hypothyroid 08/06/2021   COVID-19  virus infection 08/06/2021   Syncope 05/31/2021   Right leg DVT ([place]) 05/31/2021   Spinal stenosis, lumbar 08/07/2012    ONSET DATE: 04/28/2022  REFERRING DIAG: R49.0 (ICD-10-CM) - Dysphonia  PERTINENT HISTORY: Pt is a 76 year old male with past medical history of reflux, hiatal hernia (EGD 04/15/2022), thyroidectomy, hearing loss, COVID (August 2022), thrush (04/28/2022)   DIAGNOSTIC FINDINGS: CT Soft Tissue neck: negative; Strobovideolaryngoscopy (04/28/2022) - thrush, treated with diflucan; Laryngeal anatomy: pachydermia  and supraglottic tension (medial); Supraglottic hyperfunction: mild lateral; Glottic closure: intermittently incomplete; Strobovideolaryngoscopy (05/28/2022) - Mild bilateral bowing of vocal cords and bilateral muscle tension dysphonia with touching of false vocal folds with phonation; Laryngeal anatomy: supraglottic tension (A-p), Supraglottic hyperfunction: mild lateral  THERAPY DIAG:  Dysphonia  Muscle tension dysphonia  Rationale for Evaluation and Treatment Rehabilitation  SUBJECTIVE: pt pleasant, has hearing aids in   Pt accompanied by: significant other  PAIN:  Are you having pain? No  PATIENT GOALS: to be able to project his voice for participation in meetings and church activities  OBJECTIVE:   TODAY'S TREATMENT: Skilled treatment session focused on vocal hygiene, abdominal breathing, resonant voice therapy. Pt was independent with abdominal breathing throughout the session. They also report buying pectin lozenges, increased water consumption as well as reduced throat clears. Throat clears were not observed in the session.   SLP further facilitated the session by instructing pt in resonant voice. Pt able to progress from isolated sounds to single syllable words and then bi-syllable words. Assistance was faded from maximal multimodal assist to rare min A.    PATIENT EDUCATION: Education details: Insurance underwriter, abdominal breathing Person educated: Patient and Spouse Education method: Explanation, Demonstration, Tactile cues, Verbal cues, and Handouts Education comprehension: needs further education  GOALS: Goals reviewed with patient? Yes  SHORT TERM GOALS: Target date: 10 sessions   The patient will demonstrate abdominal breathing patterns and steady release of breath on exhalation to optimize efficiency of voicing and decrease laryngeal hyperfunction. Baseline: sub-optimal Goal status: INITIAL   2.  The patient will increase hydration for an eventual goal of 6-8  glasses per day and limit caffeine intake (to maximum of 1-2, 8 oz cups/day), as measured by patient report. Baseline: 4 glasses Goal status: INITIAL   3.   The patient will eliminate phonotraumatic behaviors such as chronic throat clearing, by substituting non-traumatic methods to clear mucus. Baseline: habitual throat clearing Goal status: INITIAL   4.   The patient will utilize a forward tone focused/resonant voice to decrease vocal hyperfunction and improve voice quality and vocal projection.  Baseline: vocal fold hyperfunction Goal status: INITIAL  LONG TERM GOALS: Target date: 08/18/2022      The patient will eliminate phonotraumatic behaviors such as chronic throat clearing, by substituting non-traumatic methods to clear mucus. Baseline: new goal Goal status: INITIAL   2.  The patient will be independent for abdominal breathing and breath support exercises. Baseline: new goal Goal status: INITIAL   3.  The patient will demonstrate independent understanding of vocal hygiene concepts. Baseline: new goal Goal status: INITIAL   4.  The patient will maintain relaxed phonation for paragraph length recitation with 80% accuracy.  Baseline: new goal Goal status: INITIAL    ASSESSMENT:  CLINICAL IMPRESSION: Pt presents with moderate dysphonia that sounds strained, low intensity and reduced speech intelligibility (~ 75% at the conversation level). Pt demonstrated improved phonation after engaging in resonant voice therapy. This improved pt's prognosis for recovery.   OBJECTIVE IMPAIRMENTS include voice  disorder. These impairments are limiting patient from household responsibilities and effectively communicating at home and in community. Factors affecting potential to achieve goals and functional outcome are severity of impairments. Patient will benefit from skilled SLP services to address above impairments and improve overall function.  REHAB POTENTIAL: Good  PLAN: SLP FREQUENCY:  2x/week  SLP DURATION: 12 weeks  PLANNED INTERVENTIONS: SLP instruction and feedback and Patient/family education   Zackory Pudlo B. Rutherford Nail, M.S., CCC-SLP, Mining engineer Certified Brain Injury [place]  [place] Office [phone] Ascom [phone] Fax [phone]

## 2022-07-02 ENCOUNTER — Encounter: Payer: Self-pay | Admitting: Gastroenterology

## 2022-07-05 ENCOUNTER — Ambulatory Visit: Payer: Medicare HMO | Admitting: Speech Pathology

## 2022-07-05 DIAGNOSIS — R490 Dysphonia: Secondary | ICD-10-CM | POA: Diagnosis not present

## 2022-07-05 NOTE — Therapy (Signed)
OUTPATIENT SPEECH LANGUAGE PATHOLOGY TREATMENT NOTE   Patient Name: Keith Williamson MRN: [id number] DOB:05/29/1946, 76 y.o., male Today's Date: 07/05/2022  PCP: Tracie Harrier, MD REFERRING PROVIDER: Carloyn Manner, MD  END OF SESSION:   End of Session - 07/05/22 1114     Visit Number 4    Number of Visits 17    Date for SLP Re-Evaluation 08/18/22    Authorization Type [employer] HMO    Progress Note Due on Visit 10    SLP Start Time 1100    SLP Stop Time  1200    SLP Time Calculation (min) 60 min    Activity Tolerance Patient tolerated treatment well             Past Medical History:  Diagnosis Date   Arthritis    BPH (benign prostatic hypertrophy)    CAD (coronary artery disease)    Diverticulosis    Enlarged prostate    GERD (gastroesophageal reflux disease)    Headache(784.0)    frequent sinus headache   Hurthle cell tumor    Hyperlipidemia    Hypertension    Hypothyroidism    MI (myocardial infarction) ([place]) 2014   inferior; s/p PCI with stent placement to RCA   OA (osteoarthritis)    Retina hole    Senile nuclear sclerosis    Thyroid cancer ([place])    iodine tx   Valvular heart disease    Past Surgical History:  Procedure Laterality Date   ANGIOPLASTY / STENTING FEMORAL     BACK SURGERY     COLONOSCOPY WITH PROPOFOL N/A 05/07/2016   Procedure: COLONOSCOPY WITH PROPOFOL;  Surgeon: Lollie Sails, MD;  Location[facility] ENDOSCOPY;  Service: Endoscopy;  Laterality: N/A;   COLONOSCOPY WITH PROPOFOL N/A 05/11/2022   Procedure: COLONOSCOPY WITH PROPOFOL;  Surgeon: Lin Landsman, MD;  Location: [facility] ENDOSCOPY;  Service: Gastroenterology;  Laterality: N/A;   ESOPHAGOGASTRODUODENOSCOPY (EGD) WITH PROPOFOL N/A 05/07/2016   Procedure: ESOPHAGOGASTRODUODENOSCOPY (EGD) WITH PROPOFOL;  Surgeon: Lollie Sails, MD;  Location: [facility] ENDOSCOPY;  Service: Endoscopy;  Laterality: N/A;   ESOPHAGOGASTRODUODENOSCOPY (EGD) WITH PROPOFOL N/A 04/16/2022    Procedure: ESOPHAGOGASTRODUODENOSCOPY (EGD) WITH PROPOFOL;  Surgeon: Lin Landsman, MD;  Location: [facility] ENDOSCOPY;  Service: Gastroenterology;  Laterality: N/A;   GIVENS CAPSULE STUDY N/A 06/01/2022   Procedure: GIVENS CAPSULE STUDY;  Surgeon: Lin Landsman, MD;  Location: [facility] ENDOSCOPY;  Service: Gastroenterology;  Laterality: N/A;   GREEN LIGHT LASER TURP (TRANSURETHRAL RESECTION OF PROSTATE N/A 01/01/2021   Procedure: GREEN LIGHT LASER TURP (TRANSURETHRAL RESECTION OF PROSTATE;  Surgeon: Royston Cowper, MD;  Location: [facility] ORS;  Service: Urology;  Laterality: N/A;   JOINT REPLACEMENT     KNEE ARTHROSCOPY Left    Medial and lateral meniscectomy, partial   LUMBAR LAMINECTOMY  08/16/2012   L 3 4 & 5   LUMBAR MICRODISCECTOMY     L3-L4; with partial hemilaminectomy, partial facetectomy and foraminotomy   TOTAL THYROIDECTOMY     TURP VAPORIZATION     Patient Active Problem List   Diagnosis Date Noted   Normal esophagogastroduodenoscopy (EGD)    Polyp of transverse colon    Orthostatic hypotension 04/16/2022   Melena 04/16/2022   Dyslipidemia 04/16/2022   Hypothyroidism, unspecified 04/16/2022   BPH (benign prostatic hyperplasia) 04/16/2022   Paroxysmal atrial fibrillation ([place]) 04/16/2022   Pressure injury of skin 04/16/2022   Upper abdominal pain    Essential hypertension 08/06/2021   Hyperlipidemia 08/06/2021   Hypothyroid 08/06/2021   COVID-19  virus infection 08/06/2021   Syncope 05/31/2021   Right leg DVT ([place]) 05/31/2021   Spinal stenosis, lumbar 08/07/2012    ONSET DATE: 04/28/2022  REFERRING DIAG: R49.0 (ICD-10-CM) - Dysphonia  PERTINENT HISTORY: Pt is a 77 year old male with past medical history of reflux, hiatal hernia (EGD 04/15/2022), thyroidectomy, hearing loss, COVID (August 2022), thrush (04/28/2022)   DIAGNOSTIC FINDINGS: CT Soft Tissue neck: negative; Strobovideolaryngoscopy (04/28/2022) - thrush, treated with diflucan; Laryngeal anatomy: pachydermia  and supraglottic tension (medial); Supraglottic hyperfunction: mild lateral; Glottic closure: intermittently incomplete; Strobovideolaryngoscopy (05/28/2022) - Mild bilateral bowing of vocal cords and bilateral muscle tension dysphonia with touching of false vocal folds with phonation; Laryngeal anatomy: supraglottic tension (A-p), Supraglottic hyperfunction: mild lateral  THERAPY DIAG:  Dysphonia  Muscle tension dysphonia  Rationale for Evaluation and Treatment Rehabilitation  SUBJECTIVE: "I have a sinus infection"  Pt accompanied by: significant other  PAIN:  Are you having pain? No  PATIENT GOALS: to be able to project his voice for participation in meetings and church activities  OBJECTIVE:   TODAY'S TREATMENT: Skilled treatment session focused on vocal hygiene, abdominal breathing, resonant voice therapy. Pt was independent with abdominal breathing throughout the session. They also report buying pectin lozenges, increased water consumption as well as reduced throat clears. Throat clears were not observed in the session.   SLP further facilitated the session by instructing pt in resonant voice. Pt able to single words to simple phrases. Pt required moderate faded to minimal cues to use abdominal breathing but pt demonstrated much improved vocal quality in 75% of utterances.    PATIENT EDUCATION: Education details: Insurance underwriter, abdominal breathing Person educated: Patient and Spouse Education method: Explanation, Demonstration, Tactile cues, Verbal cues, and Handouts Education comprehension: needs further education  GOALS: Goals reviewed with patient? Yes  SHORT TERM GOALS: Target date: 10 sessions   The patient will demonstrate abdominal breathing patterns and steady release of breath on exhalation to optimize efficiency of voicing and decrease laryngeal hyperfunction. Baseline: sub-optimal Goal status: INITIAL   2.  The patient will increase hydration for an eventual  goal of 6-8 glasses per day and limit caffeine intake (to maximum of 1-2, 8 oz cups/day), as measured by patient report. Baseline: 4 glasses Goal status: INITIAL   3.   The patient will eliminate phonotraumatic behaviors such as chronic throat clearing, by substituting non-traumatic methods to clear mucus. Baseline: habitual throat clearing Goal status: INITIAL   4.   The patient will utilize a forward tone focused/resonant voice to decrease vocal hyperfunction and improve voice quality and vocal projection.  Baseline: vocal fold hyperfunction Goal status: INITIAL  LONG TERM GOALS: Target date: 08/18/2022      The patient will eliminate phonotraumatic behaviors such as chronic throat clearing, by substituting non-traumatic methods to clear mucus. Baseline: new goal Goal status: INITIAL   2.  The patient will be independent for abdominal breathing and breath support exercises. Baseline: new goal Goal status: INITIAL   3.  The patient will demonstrate independent understanding of vocal hygiene concepts. Baseline: new goal Goal status: INITIAL   4.  The patient will maintain relaxed phonation for paragraph length recitation with 80% accuracy.  Baseline: new goal Goal status: INITIAL    ASSESSMENT:  CLINICAL IMPRESSION: Pt presents with post-COVID hoarseness that is c/b low vocal intensity, pharyngeal resonance and strained. Pt attempted to project his voice during church. Pt's wife reports that "it wasn't good." Education provided that to prevent poor vocal behaviors, pt must  use a microphone when projecting his voice for the foreseeable future. Pt continues to demonstrate improved vocal quality when engaging in resonant voice exercises.   OBJECTIVE IMPAIRMENTS include voice disorder. These impairments are limiting patient from household responsibilities and effectively communicating at home and in community. Factors affecting potential to achieve goals and functional outcome are  severity of impairments. Patient will benefit from skilled SLP services to address above impairments and improve overall function.  REHAB POTENTIAL: Good  PLAN: SLP FREQUENCY: 2x/week  SLP DURATION: 12 weeks  PLANNED INTERVENTIONS: SLP instruction and feedback and Patient/family education   Loraine Bhullar B. Rutherford Nail, M.S., CCC-SLP, Mining engineer Certified Brain Injury [place]  [place] Office [phone] Ascom [phone] Fax [phone]

## 2022-07-08 ENCOUNTER — Ambulatory Visit: Payer: Medicare HMO | Admitting: Speech Pathology

## 2022-07-08 DIAGNOSIS — R490 Dysphonia: Secondary | ICD-10-CM | POA: Diagnosis not present

## 2022-07-08 NOTE — Therapy (Signed)
OUTPATIENT SPEECH LANGUAGE PATHOLOGY TREATMENT NOTE   Patient Name: Keith Williamson MRN: [id number] DOB:1946-04-09, 76 y.o., male Today's Date: 07/08/2022  PCP: Tracie Harrier, MD REFERRING PROVIDER: Carloyn Manner, MD  END OF SESSION:   End of Session - 07/08/22 0954     Visit Number 5    Number of Visits 17    Date for SLP Re-Evaluation 08/18/22    Authorization Type [employer] HMO    Progress Note Due on Visit 10    SLP Start Time 0915    SLP Stop Time  1000    SLP Time Calculation (min) 45 min    Activity Tolerance Patient tolerated treatment well             Past Medical History:  Diagnosis Date   Arthritis    BPH (benign prostatic hypertrophy)    CAD (coronary artery disease)    Diverticulosis    Enlarged prostate    GERD (gastroesophageal reflux disease)    Headache(784.0)    frequent sinus headache   Hurthle cell tumor    Hyperlipidemia    Hypertension    Hypothyroidism    MI (myocardial infarction) ([place]) 2014   inferior; s/p PCI with stent placement to RCA   OA (osteoarthritis)    Retina hole    Senile nuclear sclerosis    Thyroid cancer ([place])    iodine tx   Valvular heart disease    Past Surgical History:  Procedure Laterality Date   ANGIOPLASTY / STENTING FEMORAL     BACK SURGERY     COLONOSCOPY WITH PROPOFOL N/A 05/07/2016   Procedure: COLONOSCOPY WITH PROPOFOL;  Surgeon: Lollie Sails, MD;  Location: [facility] ENDOSCOPY;  Service: Endoscopy;  Laterality: N/A;   COLONOSCOPY WITH PROPOFOL N/A 05/11/2022   Procedure: COLONOSCOPY WITH PROPOFOL;  Surgeon: Lin Landsman, MD;  Location: [facility] ENDOSCOPY;  Service: Gastroenterology;  Laterality: N/A;   ESOPHAGOGASTRODUODENOSCOPY (EGD) WITH PROPOFOL N/A 05/07/2016   Procedure: ESOPHAGOGASTRODUODENOSCOPY (EGD) WITH PROPOFOL;  Surgeon: Lollie Sails, MD;  Location: [facility] ENDOSCOPY;  Service: Endoscopy;  Laterality: N/A;   ESOPHAGOGASTRODUODENOSCOPY (EGD) WITH PROPOFOL N/A 04/16/2022    Procedure: ESOPHAGOGASTRODUODENOSCOPY (EGD) WITH PROPOFOL;  Surgeon: Lin Landsman, MD;  Location: [facility] ENDOSCOPY;  Service: Gastroenterology;  Laterality: N/A;   GIVENS CAPSULE STUDY N/A 06/01/2022   Procedure: GIVENS CAPSULE STUDY;  Surgeon: Lin Landsman, MD;  Location: [facility] ENDOSCOPY;  Service: Gastroenterology;  Laterality: N/A;   GREEN LIGHT LASER TURP (TRANSURETHRAL RESECTION OF PROSTATE N/A 01/01/2021   Procedure: GREEN LIGHT LASER TURP (TRANSURETHRAL RESECTION OF PROSTATE;  Surgeon: Royston Cowper, MD;  Location: [facility] ORS;  Service: Urology;  Laterality: N/A;   JOINT REPLACEMENT     KNEE ARTHROSCOPY Left    Medial and lateral meniscectomy, partial   LUMBAR LAMINECTOMY  08/16/2012   L 3 4 & 5   LUMBAR MICRODISCECTOMY     L3-L4; with partial hemilaminectomy, partial facetectomy and foraminotomy   TOTAL THYROIDECTOMY     TURP VAPORIZATION     Patient Active Problem List   Diagnosis Date Noted   Normal esophagogastroduodenoscopy (EGD)    Polyp of transverse colon    Orthostatic hypotension 04/16/2022   Melena 04/16/2022   Dyslipidemia 04/16/2022   Hypothyroidism, unspecified 04/16/2022   BPH (benign prostatic hyperplasia) 04/16/2022   Paroxysmal atrial fibrillation ([place]) 04/16/2022   Pressure injury of skin 04/16/2022   Upper abdominal pain    Essential hypertension 08/06/2021   Hyperlipidemia 08/06/2021   Hypothyroid 08/06/2021   COVID-19  virus infection 08/06/2021   Syncope 05/31/2021   Right leg DVT ([place]) 05/31/2021   Spinal stenosis, lumbar 08/07/2012    ONSET DATE: 04/28/2022  REFERRING DIAG: R49.0 (ICD-10-CM) - Dysphonia  PERTINENT HISTORY: Pt is a 76 year old male with past medical history of reflux, hiatal hernia (EGD 04/15/2022), thyroidectomy, hearing loss, COVID (August 2022), thrush (04/28/2022)   DIAGNOSTIC FINDINGS: CT Soft Tissue neck: negative; Strobovideolaryngoscopy (04/28/2022) - thrush, treated with diflucan; Laryngeal anatomy: pachydermia  and supraglottic tension (medial); Supraglottic hyperfunction: mild lateral; Glottic closure: intermittently incomplete; Strobovideolaryngoscopy (05/28/2022) - Mild bilateral bowing of vocal cords and bilateral muscle tension dysphonia with touching of false vocal folds with phonation; Laryngeal anatomy: supraglottic tension (A-p), Supraglottic hyperfunction: mild lateral  THERAPY DIAG:  Dysphonia  Muscle tension dysphonia  Rationale for Evaluation and Treatment Rehabilitation  SUBJECTIVE: pt and his wife report having his hearing aids "calibrated" Pt accompanied by: significant other  PAIN:  Are you having pain? No  PATIENT GOALS: to be able to project his voice for participation in meetings and church activities  OBJECTIVE:   TODAY'S TREATMENT: Skilled treatment session focused on vocal hygiene, abdominal breathing, resonant voice therapy. Pt was independent with abdominal breathing throughout the session.   Pt with sub-optimal vocal intensity (60 dB). When prompted to increase vocal intensity, pt took his hearing aids out. Pt wears his hearing aids all day and it appears that pt's sub-optimal vocal intensity started when pt began wearing his hearing aids full-time. To pt, when speaking at an optimal vocal intensity it sounds very loud. In an effort to develop an appropriate perception of vocal intensity, SLP demonstrated vocal quality of this therapist and pt's wife using audio feedback. Using audio feedback pt able to achieve ~ 75 dB when reading verses of scripture progressing to connected verses of scripture. Education provided on improved pitch when improved vocal instensity.    PATIENT EDUCATION: Education details: increased vocal intensity Person educated: Patient and Spouse Education method: Explanation, Demonstration, Tactile cues, Verbal cues, and Handouts Education comprehension: needs further education  GOALS: Goals reviewed with patient? Yes  SHORT TERM GOALS: Target  date: 10 sessions   The patient will demonstrate abdominal breathing patterns and steady release of breath on exhalation to optimize efficiency of voicing and decrease laryngeal hyperfunction. Baseline: sub-optimal Goal status: INITIAL   2.  The patient will increase hydration for an eventual goal of 6-8 glasses per day and limit caffeine intake (to maximum of 1-2, 8 oz cups/day), as measured by patient report. Baseline: 4 glasses Goal status: INITIAL   3.   The patient will eliminate phonotraumatic behaviors such as chronic throat clearing, by substituting non-traumatic methods to clear mucus. Baseline: habitual throat clearing Goal status: INITIAL   4.   The patient will utilize a forward tone focused/resonant voice to decrease vocal hyperfunction and improve voice quality and vocal projection.  Baseline: vocal fold hyperfunction Goal status: INITIAL  LONG TERM GOALS: Target date: 08/18/2022      The patient will eliminate phonotraumatic behaviors such as chronic throat clearing, by substituting non-traumatic methods to clear mucus. Baseline: new goal Goal status: INITIAL   2.  The patient will be independent for abdominal breathing and breath support exercises. Baseline: new goal Goal status: INITIAL   3.  The patient will demonstrate independent understanding of vocal hygiene concepts. Baseline: new goal Goal status: INITIAL   4.  The patient will maintain relaxed phonation for paragraph length recitation with 80% accuracy.  Baseline: new goal Goal  status: INITIAL    ASSESSMENT:  CLINICAL IMPRESSION: Pt's wife reports church members were not able to hear pt during last evenings prayer meeting. During the initial portion of session, pt's spech intelligibility was reduced to 75% at the sentence level d/t decreased vocal intensity. Pt demonstrated good stimulability for increasing vocal intensity during today's session.   OBJECTIVE IMPAIRMENTS include voice disorder. These  impairments are limiting patient from household responsibilities and effectively communicating at home and in community. Factors affecting potential to achieve goals and functional outcome are severity of impairments. Patient will benefit from skilled SLP services to address above impairments and improve overall function.  REHAB POTENTIAL: Good  PLAN: SLP FREQUENCY: 2x/week  SLP DURATION: 12 weeks  PLANNED INTERVENTIONS: SLP instruction and feedback and Patient/family education   Davison Ohms B. Rutherford Nail, M.S., CCC-SLP, Mining engineer Certified Brain Injury [place]  [place] Office [phone] Ascom [phone] Fax [phone]

## 2022-07-12 ENCOUNTER — Ambulatory Visit: Payer: Medicare HMO | Admitting: Speech Pathology

## 2022-07-12 DIAGNOSIS — R490 Dysphonia: Secondary | ICD-10-CM

## 2022-07-12 NOTE — Therapy (Signed)
OUTPATIENT SPEECH LANGUAGE PATHOLOGY TREATMENT NOTE   Patient Name: Keith Williamson MRN: [id number] DOB:1946/01/27, 76 y.o., male Today's Date: 07/12/2022  PCP: Tracie Harrier, MD REFERRING PROVIDER: Carloyn Manner, MD  END OF SESSION:   End of Session - 07/12/22 1503     Visit Number 6    Number of Visits 17    Date for SLP Re-Evaluation 08/18/22    Authorization Type [employer] HMO    Progress Note Due on Visit 10    SLP Start Time 1500    SLP Stop Time  1545    SLP Time Calculation (min) 45 min    Activity Tolerance Patient tolerated treatment well             Past Medical History:  Diagnosis Date   Arthritis    BPH (benign prostatic hypertrophy)    CAD (coronary artery disease)    Diverticulosis    Enlarged prostate    GERD (gastroesophageal reflux disease)    Headache(784.0)    frequent sinus headache   Hurthle cell tumor    Hyperlipidemia    Hypertension    Hypothyroidism    MI (myocardial infarction) ([place]) 2014   inferior; s/p PCI with stent placement to RCA   OA (osteoarthritis)    Retina hole    Senile nuclear sclerosis    Thyroid cancer ([place])    iodine tx   Valvular heart disease    Past Surgical History:  Procedure Laterality Date   ANGIOPLASTY / STENTING FEMORAL     BACK SURGERY     COLONOSCOPY WITH PROPOFOL N/A 05/07/2016   Procedure: COLONOSCOPY WITH PROPOFOL;  Surgeon: Lollie Sails, MD;  Location[facility] ENDOSCOPY;  Service: Endoscopy;  Laterality: N/A;   COLONOSCOPY WITH PROPOFOL N/A 05/11/2022   Procedure: COLONOSCOPY WITH PROPOFOL;  Surgeon: Lin Landsman, MD;  Location: [facility] ENDOSCOPY;  Service: Gastroenterology;  Laterality: N/A;   ESOPHAGOGASTRODUODENOSCOPY (EGD) WITH PROPOFOL N/A 05/07/2016   Procedure: ESOPHAGOGASTRODUODENOSCOPY (EGD) WITH PROPOFOL;  Surgeon: Lollie Sails, MD;  Location: [facility] ENDOSCOPY;  Service: Endoscopy;  Laterality: N/A;   ESOPHAGOGASTRODUODENOSCOPY (EGD) WITH PROPOFOL N/A 04/16/2022    Procedure: ESOPHAGOGASTRODUODENOSCOPY (EGD) WITH PROPOFOL;  Surgeon: Lin Landsman, MD;  Location: [facility] ENDOSCOPY;  Service: Gastroenterology;  Laterality: N/A;   GIVENS CAPSULE STUDY N/A 06/01/2022   Procedure: GIVENS CAPSULE STUDY;  Surgeon: Lin Landsman, MD;  Location: [facility] ENDOSCOPY;  Service: Gastroenterology;  Laterality: N/A;   GREEN LIGHT LASER TURP (TRANSURETHRAL RESECTION OF PROSTATE N/A 01/01/2021   Procedure: GREEN LIGHT LASER TURP (TRANSURETHRAL RESECTION OF PROSTATE;  Surgeon: Royston Cowper, MD;  Location: [facility] ORS;  Service: Urology;  Laterality: N/A;   JOINT REPLACEMENT     KNEE ARTHROSCOPY Left    Medial and lateral meniscectomy, partial   LUMBAR LAMINECTOMY  08/16/2012   L 3 4 & 5   LUMBAR MICRODISCECTOMY     L3-L4; with partial hemilaminectomy, partial facetectomy and foraminotomy   TOTAL THYROIDECTOMY     TURP VAPORIZATION     Patient Active Problem List   Diagnosis Date Noted   Normal esophagogastroduodenoscopy (EGD)    Polyp of transverse colon    Orthostatic hypotension 04/16/2022   Melena 04/16/2022   Dyslipidemia 04/16/2022   Hypothyroidism, unspecified 04/16/2022   BPH (benign prostatic hyperplasia) 04/16/2022   Paroxysmal atrial fibrillation ([place]) 04/16/2022   Pressure injury of skin 04/16/2022   Upper abdominal pain    Essential hypertension 08/06/2021   Hyperlipidemia 08/06/2021   Hypothyroid 08/06/2021   COVID-19  virus infection 08/06/2021   Syncope 05/31/2021   Right leg DVT ([place]) 05/31/2021   Spinal stenosis, lumbar 08/07/2012    ONSET DATE: 04/28/2022  REFERRING DIAG: R49.0 (ICD-10-CM) - Dysphonia  PERTINENT HISTORY: Pt is a 76 year old male with past medical history of reflux, hiatal hernia (EGD 04/15/2022), thyroidectomy, hearing loss, COVID (August 2022), thrush (04/28/2022)   DIAGNOSTIC FINDINGS: CT Soft Tissue neck: negative; Strobovideolaryngoscopy (04/28/2022) - thrush, treated with diflucan; Laryngeal anatomy: pachydermia  and supraglottic tension (medial); Supraglottic hyperfunction: mild lateral; Glottic closure: intermittently incomplete; Strobovideolaryngoscopy (05/28/2022) - Mild bilateral bowing of vocal cords and bilateral muscle tension dysphonia with touching of false vocal folds with phonation; Laryngeal anatomy: supraglottic tension (A-p), Supraglottic hyperfunction: mild lateral  THERAPY DIAG:  Dysphonia  Muscle tension dysphonia  Rationale for Evaluation and Treatment Rehabilitation  SUBJECTIVE: "I have some scripture for your son"  Pt accompanied by: significant other  PAIN:  Are you having pain? No  PATIENT GOALS: to be able to project his voice for participation in meetings and church activities  OBJECTIVE:   TODAY'S TREATMENT: Skilled treatment session focused on vocal hygiene, abdominal breathing, resonant voice therapy.   Pt was Mod I in initial conversation for use of vocal intensity (~ 77 dB), relaxed voicing and rare Min A for abdominal breathing.  SLP further facilitated session by engaging pt in complex conversation with unfamiliar topic. Pt used a loud, good quality during 45 minute conversation.    PATIENT EDUCATION: Education details: increased vocal intensity Person educated: Patient and Spouse Education method: Explanation, Demonstration, Tactile cues, Verbal cues, and Handouts Education comprehension: needs further education  GOALS: Goals reviewed with patient? Yes  SHORT TERM GOALS: Target date: 10 sessions   The patient will demonstrate abdominal breathing patterns and steady release of breath on exhalation to optimize efficiency of voicing and decrease laryngeal hyperfunction. Baseline: sub-optimal Goal status: INITIAL   2.  The patient will increase hydration for an eventual goal of 6-8 glasses per day and limit caffeine intake (to maximum of 1-2, 8 oz cups/day), as measured by patient report. Baseline: 4 glasses Goal status: INITIAL   3.   The patient will  eliminate phonotraumatic behaviors such as chronic throat clearing, by substituting non-traumatic methods to clear mucus. Baseline: habitual throat clearing Goal status: INITIAL   4.   The patient will utilize a forward tone focused/resonant voice to decrease vocal hyperfunction and improve voice quality and vocal projection.  Baseline: vocal fold hyperfunction Goal status: INITIAL  LONG TERM GOALS: Target date: 08/18/2022      The patient will eliminate phonotraumatic behaviors such as chronic throat clearing, by substituting non-traumatic methods to clear mucus. Baseline: new goal Goal status: INITIAL   2.  The patient will be independent for abdominal breathing and breath support exercises. Baseline: new goal Goal status: INITIAL   3.  The patient will demonstrate independent understanding of vocal hygiene concepts. Baseline: new goal Goal status: INITIAL   4.  The patient will maintain relaxed phonation for paragraph length recitation with 80% accuracy.  Baseline: new goal Goal status: INITIAL    ASSESSMENT:  CLINICAL IMPRESSION: Pt continues to present with improved dysphonia. He and his wife report completing HEP. They further report that pt has been talking on the phone without have to repeat himself.   OBJECTIVE IMPAIRMENTS include voice disorder. These impairments are limiting patient from household responsibilities and effectively communicating at home and in community. Factors affecting potential to achieve goals and functional outcome are severity  of impairments. Patient will benefit from skilled SLP services to address above impairments and improve overall function.  REHAB POTENTIAL: Good  PLAN: SLP FREQUENCY: 2x/week  SLP DURATION: 12 weeks  PLANNED INTERVENTIONS: SLP instruction and feedback and Patient/family education   Dhiren Azimi B. Rutherford Nail, M.S., CCC-SLP, Mining engineer Certified Brain Injury [place]  [place] Office [phone] Ascom [phone] Fax [phone]

## 2022-07-15 ENCOUNTER — Ambulatory Visit: Payer: Medicare HMO | Attending: Otolaryngology | Admitting: Speech Pathology

## 2022-07-15 DIAGNOSIS — K219 Gastro-esophageal reflux disease without esophagitis: Secondary | ICD-10-CM | POA: Diagnosis not present

## 2022-07-15 DIAGNOSIS — R490 Dysphonia: Secondary | ICD-10-CM | POA: Diagnosis not present

## 2022-07-15 DIAGNOSIS — I10 Essential (primary) hypertension: Secondary | ICD-10-CM | POA: Diagnosis not present

## 2022-07-15 DIAGNOSIS — I2510 Atherosclerotic heart disease of native coronary artery without angina pectoris: Secondary | ICD-10-CM | POA: Diagnosis not present

## 2022-07-15 DIAGNOSIS — R140 Abdominal distension (gaseous): Secondary | ICD-10-CM | POA: Diagnosis not present

## 2022-07-15 DIAGNOSIS — R198 Other specified symptoms and signs involving the digestive system and abdomen: Secondary | ICD-10-CM | POA: Diagnosis not present

## 2022-07-15 DIAGNOSIS — R7309 Other abnormal glucose: Secondary | ICD-10-CM | POA: Diagnosis not present

## 2022-07-15 DIAGNOSIS — K5900 Constipation, unspecified: Secondary | ICD-10-CM | POA: Diagnosis not present

## 2022-07-15 DIAGNOSIS — I700 Atherosclerosis of aorta: Secondary | ICD-10-CM | POA: Diagnosis not present

## 2022-07-15 DIAGNOSIS — M542 Cervicalgia: Secondary | ICD-10-CM | POA: Diagnosis not present

## 2022-07-15 DIAGNOSIS — R634 Abnormal weight loss: Secondary | ICD-10-CM | POA: Diagnosis not present

## 2022-07-16 NOTE — Therapy (Signed)
OUTPATIENT SPEECH LANGUAGE PATHOLOGY TREATMENT NOTE   Patient Name: Keith Williamson MRN: [id number] DOB:[birth year], 76 y.o., male Today's Date: 07/16/2022  PCP: Tracie Harrier, MD REFERRING PROVIDER: Carloyn Manner, MD  END OF SESSION:   End of Session - 07/16/22 1412     Visit Number 7    Number of Visits 17    Date for SLP Re-Evaluation 08/18/22    Authorization Type [employer] HMO    Progress Note Due on Visit 10    SLP Start Time 37    SLP Stop Time  1400    SLP Time Calculation (min) 60 min    Activity Tolerance Patient tolerated treatment well             Past Medical History:  Diagnosis Date   Arthritis    BPH (benign prostatic hypertrophy)    CAD (coronary artery disease)    Diverticulosis    Enlarged prostate    GERD (gastroesophageal reflux disease)    Headache(784.0)    frequent sinus headache   Hurthle cell tumor    Hyperlipidemia    Hypertension    Hypothyroidism    MI (myocardial infarction) ([place]) 2014   inferior; s/p PCI with stent placement to RCA   OA (osteoarthritis)    Retina hole    Senile nuclear sclerosis    Thyroid cancer ([place])    iodine tx   Valvular heart disease    Past Surgical History:  Procedure Laterality Date   ANGIOPLASTY / STENTING FEMORAL     BACK SURGERY     COLONOSCOPY WITH PROPOFOL N/A 05/07/2016   Procedure: COLONOSCOPY WITH PROPOFOL;  Surgeon: Lollie Sails, MD;  Location: [facility] ENDOSCOPY;  Service: Endoscopy;  Laterality: N/A;   COLONOSCOPY WITH PROPOFOL N/A 05/11/2022   Procedure: COLONOSCOPY WITH PROPOFOL;  Surgeon: Lin Landsman, MD;  Location: [facility] ENDOSCOPY;  Service: Gastroenterology;  Laterality: N/A;   ESOPHAGOGASTRODUODENOSCOPY (EGD) WITH PROPOFOL N/A 05/07/2016   Procedure: ESOPHAGOGASTRODUODENOSCOPY (EGD) WITH PROPOFOL;  Surgeon: Lollie Sails, MD;  Location: [facility] ENDOSCOPY;  Service: Endoscopy;  Laterality: N/A;   ESOPHAGOGASTRODUODENOSCOPY (EGD) WITH PROPOFOL N/A 04/16/2022    Procedure: ESOPHAGOGASTRODUODENOSCOPY (EGD) WITH PROPOFOL;  Surgeon: Lin Landsman, MD;  Location: [facility] ENDOSCOPY;  Service: Gastroenterology;  Laterality: N/A;   GIVENS CAPSULE STUDY N/A 06/01/2022   Procedure: GIVENS CAPSULE STUDY;  Surgeon: Lin Landsman, MD;  Location: [facility] ENDOSCOPY;  Service: Gastroenterology;  Laterality: N/A;   GREEN LIGHT LASER TURP (TRANSURETHRAL RESECTION OF PROSTATE N/A 01/01/2021   Procedure: GREEN LIGHT LASER TURP (TRANSURETHRAL RESECTION OF PROSTATE;  Surgeon: Royston Cowper, MD;  Location: [facility] ORS;  Service: Urology;  Laterality: N/A;   JOINT REPLACEMENT     KNEE ARTHROSCOPY Left    Medial and lateral meniscectomy, partial   LUMBAR LAMINECTOMY  08/16/2012   L 3 4 & 5   LUMBAR MICRODISCECTOMY     L3-L4; with partial hemilaminectomy, partial facetectomy and foraminotomy   TOTAL THYROIDECTOMY     TURP VAPORIZATION     Patient Active Problem List   Diagnosis Date Noted   Normal esophagogastroduodenoscopy (EGD)    Polyp of transverse colon    Orthostatic hypotension 04/16/2022   Melena 04/16/2022   Dyslipidemia 04/16/2022   Hypothyroidism, unspecified 04/16/2022   BPH (benign prostatic hyperplasia) 04/16/2022   Paroxysmal atrial fibrillation ([place]) 04/16/2022   Pressure injury of skin 04/16/2022   Upper abdominal pain    Essential hypertension 08/06/2021   Hyperlipidemia 08/06/2021   Hypothyroid 08/06/2021   COVID-19  virus infection 08/06/2021   Syncope 05/31/2021   Right leg DVT ([place]) 05/31/2021   Spinal stenosis, lumbar 08/07/2012    ONSET DATE: 04/28/2022  REFERRING DIAG: R49.0 (ICD-10-CM) - Dysphonia  PERTINENT HISTORY: Pt is a 76 year old male with past medical history of reflux, hiatal hernia (EGD 04/15/2022), thyroidectomy, hearing loss, COVID (August 2022), thrush (04/28/2022)   DIAGNOSTIC FINDINGS: CT Soft Tissue neck: negative; Strobovideolaryngoscopy (04/28/2022) - thrush, treated with diflucan; Laryngeal anatomy: pachydermia  and supraglottic tension (medial); Supraglottic hyperfunction: mild lateral; Glottic closure: intermittently incomplete; Strobovideolaryngoscopy (05/28/2022) - Mild bilateral bowing of vocal cords and bilateral muscle tension dysphonia with touching of false vocal folds with phonation; Laryngeal anatomy: supraglottic tension (A-p), Supraglottic hyperfunction: mild lateral  THERAPY DIAG:  Dysphonia  Muscle tension dysphonia  Rationale for Evaluation and Treatment Rehabilitation  SUBJECTIVE: "when he is praying at church, people still can't hear him"  Pt accompanied by: significant other  PAIN:  Are you having pain? No  PATIENT GOALS: to be able to project his voice for participation in meetings and church activities  OBJECTIVE:   TODAY'S TREATMENT: Skilled treatment session focused on vocal hygiene, abdominal breathing, resonant voice therapy.   During today's session, pt was able to read aloud for 45 minutes without any evidence of dysphonia. His wife reports that he has to be continuously cued outside of the therapy room. Multiple strategies introduced to aid on creating improved vocal quality a habit. Pt attributes much of his struggle with getting "use to talking softer once I got my hearing aids." Recommend pt pray aloud multiple times per day with his wife, read their Bible study with his wife sitting at a distance from him. Also instructed pt to create some form of tactile or visual reminder for referance to speak using a good quality loud voice.    PATIENT EDUCATION: Education details: increased vocal intensity Person educated: Patient and Spouse Education method: Explanation, Demonstration, Tactile cues, Verbal cues, and Handouts Education comprehension: needs further education  GOALS: Goals reviewed with patient? Yes  SHORT TERM GOALS: Target date: 10 sessions   The patient will demonstrate abdominal breathing patterns and steady release of breath on exhalation to  optimize efficiency of voicing and decrease laryngeal hyperfunction. Baseline: sub-optimal Goal status: INITIAL   2.  The patient will increase hydration for an eventual goal of 6-8 glasses per day and limit caffeine intake (to maximum of 1-2, 8 oz cups/day), as measured by patient report. Baseline: 4 glasses Goal status: INITIAL   3.   The patient will eliminate phonotraumatic behaviors such as chronic throat clearing, by substituting non-traumatic methods to clear mucus. Baseline: habitual throat clearing Goal status: INITIAL   4.   The patient will utilize a forward tone focused/resonant voice to decrease vocal hyperfunction and improve voice quality and vocal projection.  Baseline: vocal fold hyperfunction Goal status: INITIAL  LONG TERM GOALS: Target date: 08/18/2022      The patient will eliminate phonotraumatic behaviors such as chronic throat clearing, by substituting non-traumatic methods to clear mucus. Baseline: new goal Goal status: INITIAL   2.  The patient will be independent for abdominal breathing and breath support exercises. Baseline: new goal Goal status: INITIAL   3.  The patient will demonstrate independent understanding of vocal hygiene concepts. Baseline: new goal Goal status: INITIAL   4.  The patient will maintain relaxed phonation for paragraph length recitation with 80% accuracy.  Baseline: new goal Goal status: INITIAL    ASSESSMENT:  CLINICAL IMPRESSION:  Pt continues to present with improved dysphonia. He and his wife report completing HEP. Pt is struggling with carryover of skills into daily activities.   OBJECTIVE IMPAIRMENTS include voice disorder. These impairments are limiting patient from household responsibilities and effectively communicating at home and in community. Factors affecting potential to achieve goals and functional outcome are severity of impairments. Patient will benefit from skilled SLP services to address above impairments and  improve overall function.  REHAB POTENTIAL: Good  PLAN: SLP FREQUENCY: 2x/week  SLP DURATION: 12 weeks  PLANNED INTERVENTIONS: SLP instruction and feedback and Patient/family education   Elden Brucato B. Rutherford Nail, M.S., CCC-SLP, Mining engineer Certified Brain Injury [place]  [place] Office [phone] Ascom [phone] Fax [phone]

## 2022-07-19 ENCOUNTER — Ambulatory Visit: Payer: Medicare HMO | Admitting: Speech Pathology

## 2022-07-19 DIAGNOSIS — D485 Neoplasm of uncertain behavior of skin: Secondary | ICD-10-CM | POA: Diagnosis not present

## 2022-07-19 DIAGNOSIS — R490 Dysphonia: Secondary | ICD-10-CM

## 2022-07-19 DIAGNOSIS — L309 Dermatitis, unspecified: Secondary | ICD-10-CM | POA: Diagnosis not present

## 2022-07-19 NOTE — Therapy (Signed)
OUTPATIENT SPEECH LANGUAGE PATHOLOGY TREATMENT NOTE   Patient Name: Keith Williamson MRN: [id number] DOB:02-26-1946, 76 y.o., male Today's Date: 07/19/2022  PCP: Tracie Harrier, MD REFERRING PROVIDER: Carloyn Manner, MD  END OF SESSION:   End of Session - 07/19/22 1404     Visit Number 8    Number of Visits 17    Date for SLP Re-Evaluation 08/18/22    Authorization Type [employer] HMO    Progress Note Due on Visit 10    SLP Start Time 57    SLP Stop Time  1445    SLP Time Calculation (min) 45 min    Activity Tolerance Patient tolerated treatment well             Past Medical History:  Diagnosis Date   Arthritis    BPH (benign prostatic hypertrophy)    CAD (coronary artery disease)    Diverticulosis    Enlarged prostate    GERD (gastroesophageal reflux disease)    Headache(784.0)    frequent sinus headache   Hurthle cell tumor    Hyperlipidemia    Hypertension    Hypothyroidism    MI (myocardial infarction) ([place]) 2014   inferior; s/p PCI with stent placement to RCA   OA (osteoarthritis)    Retina hole    Senile nuclear sclerosis    Thyroid cancer ([place])    iodine tx   Valvular heart disease    Past Surgical History:  Procedure Laterality Date   ANGIOPLASTY / STENTING FEMORAL     BACK SURGERY     COLONOSCOPY WITH PROPOFOL N/A 05/07/2016   Procedure: COLONOSCOPY WITH PROPOFOL;  Surgeon: Lollie Sails, MD;  Location: [facility] ENDOSCOPY;  Service: Endoscopy;  Laterality: N/A;   COLONOSCOPY WITH PROPOFOL N/A 05/11/2022   Procedure: COLONOSCOPY WITH PROPOFOL;  Surgeon: Lin Landsman, MD;  Location: [facility] ENDOSCOPY;  Service: Gastroenterology;  Laterality: N/A;   ESOPHAGOGASTRODUODENOSCOPY (EGD) WITH PROPOFOL N/A 05/07/2016   Procedure: ESOPHAGOGASTRODUODENOSCOPY (EGD) WITH PROPOFOL;  Surgeon: Lollie Sails, MD;  Location[facility] ENDOSCOPY;  Service: Endoscopy;  Laterality: N/A;   ESOPHAGOGASTRODUODENOSCOPY (EGD) WITH PROPOFOL N/A 04/16/2022    Procedure: ESOPHAGOGASTRODUODENOSCOPY (EGD) WITH PROPOFOL;  Surgeon: Lin Landsman, MD;  Location: [facility] ENDOSCOPY;  Service: Gastroenterology;  Laterality: N/A;   GIVENS CAPSULE STUDY N/A 06/01/2022   Procedure: GIVENS CAPSULE STUDY;  Surgeon: Lin Landsman, MD;  Location: [facility] ENDOSCOPY;  Service: Gastroenterology;  Laterality: N/A;   GREEN LIGHT LASER TURP (TRANSURETHRAL RESECTION OF PROSTATE N/A 01/01/2021   Procedure: GREEN LIGHT LASER TURP (TRANSURETHRAL RESECTION OF PROSTATE;  Surgeon: Royston Cowper, MD;  Location: [facility] ORS;  Service: Urology;  Laterality: N/A;   JOINT REPLACEMENT     KNEE ARTHROSCOPY Left    Medial and lateral meniscectomy, partial   LUMBAR LAMINECTOMY  08/16/2012   L 3 4 & 5   LUMBAR MICRODISCECTOMY     L3-L4; with partial hemilaminectomy, partial facetectomy and foraminotomy   TOTAL THYROIDECTOMY     TURP VAPORIZATION     Patient Active Problem List   Diagnosis Date Noted   Normal esophagogastroduodenoscopy (EGD)    Polyp of transverse colon    Orthostatic hypotension 04/16/2022   Melena 04/16/2022   Dyslipidemia 04/16/2022   Hypothyroidism, unspecified 04/16/2022   BPH (benign prostatic hyperplasia) 04/16/2022   Paroxysmal atrial fibrillation ([place]) 04/16/2022   Pressure injury of skin 04/16/2022   Upper abdominal pain    Essential hypertension 08/06/2021   Hyperlipidemia 08/06/2021   Hypothyroid 08/06/2021   COVID-19  virus infection 08/06/2021   Syncope 05/31/2021   Right leg DVT ([place]) 05/31/2021   Spinal stenosis, lumbar 08/07/2012    ONSET DATE: 04/28/2022  REFERRING DIAG: R49.0 (ICD-10-CM) - Dysphonia  PERTINENT HISTORY: Pt is a 76 year old male with past medical history of reflux, hiatal hernia (EGD 04/15/2022), thyroidectomy, hearing loss, COVID (August 2022), thrush (04/28/2022)   DIAGNOSTIC FINDINGS: CT Soft Tissue neck: negative; Strobovideolaryngoscopy (04/28/2022) - thrush, treated with diflucan; Laryngeal anatomy: pachydermia  and supraglottic tension (medial); Supraglottic hyperfunction: mild lateral; Glottic closure: intermittently incomplete; Strobovideolaryngoscopy (05/28/2022) - Mild bilateral bowing of vocal cords and bilateral muscle tension dysphonia with touching of false vocal folds with phonation; Laryngeal anatomy: supraglottic tension (A-p), Supraglottic hyperfunction: mild lateral  THERAPY DIAG:  Dysphonia  Muscle tension dysphonia  Rationale for Evaluation and Treatment Rehabilitation  SUBJECTIVE: Pt could be heard having conversation before SLP reached reception area d/t increased vocal intensity  Pt accompanied by: significant other  PAIN:  Are you having pain? No  PATIENT GOALS: to be able to project his voice for participation in meetings and church activities  OBJECTIVE:   TODAY'S TREATMENT: Skilled treatment session focused on vocal hygiene, abdominal breathing, resonant voice therapy.   SLP engaged pt in conversation regarding town council as well as various meetings that pt has throughout the week. Pt with markedly improved vocal quality and intensity. He was able to sustain throughout 45 minutes complex conversation. He and his wife also can tell a difference now that pt has been using the steroid nasal rinse.    PATIENT EDUCATION: Education details: increased vocal intensity Person educated: Patient and Spouse Education method: Explanation, Demonstration, Tactile cues, Verbal cues, and Handouts Education comprehension: needs further education  GOALS: Goals reviewed with patient? Yes  SHORT TERM GOALS: Target date: 10 sessions   The patient will demonstrate abdominal breathing patterns and steady release of breath on exhalation to optimize efficiency of voicing and decrease laryngeal hyperfunction. Baseline: sub-optimal Goal status: INITIAL   2.  The patient will increase hydration for an eventual goal of 6-8 glasses per day and limit caffeine intake (to maximum of 1-2, 8 oz  cups/day), as measured by patient report. Baseline: 4 glasses Goal status: INITIAL   3.   The patient will eliminate phonotraumatic behaviors such as chronic throat clearing, by substituting non-traumatic methods to clear mucus. Baseline: habitual throat clearing Goal status: INITIAL   4.   The patient will utilize a forward tone focused/resonant voice to decrease vocal hyperfunction and improve voice quality and vocal projection.  Baseline: vocal fold hyperfunction Goal status: INITIAL  LONG TERM GOALS: Target date: 08/18/2022      The patient will eliminate phonotraumatic behaviors such as chronic throat clearing, by substituting non-traumatic methods to clear mucus. Baseline: new goal Goal status: INITIAL   2.  The patient will be independent for abdominal breathing and breath support exercises. Baseline: new goal Goal status: INITIAL   3.  The patient will demonstrate independent understanding of vocal hygiene concepts. Baseline: new goal Goal status: INITIAL   4.  The patient will maintain relaxed phonation for paragraph length recitation with 80% accuracy.  Baseline: new goal Goal status: INITIAL    ASSESSMENT:  CLINICAL IMPRESSION: Pt continues to present with improved dysphonia. Pt presented with incredible vocal quality during complex conversation without any cues during today's session. She wife reports that pt did great at church and when talking on the phone to various people.   OBJECTIVE IMPAIRMENTS include voice disorder. These  impairments are limiting patient from household responsibilities and effectively communicating at home and in community. Factors affecting potential to achieve goals and functional outcome are severity of impairments. Patient will benefit from skilled SLP services to address above impairments and improve overall function.  REHAB POTENTIAL: Good  PLAN: SLP FREQUENCY: 2x/week  SLP DURATION: 12 weeks  PLANNED INTERVENTIONS: SLP  instruction and feedback and Patient/family education   Alfie Alderfer B. Rutherford Nail, M.S., CCC-SLP, Mining engineer Certified Brain Injury [place]  [place] Office [phone] Ascom [phone] Fax [phone]

## 2022-07-20 DIAGNOSIS — Z0000 Encounter for general adult medical examination without abnormal findings: Secondary | ICD-10-CM | POA: Diagnosis not present

## 2022-07-20 DIAGNOSIS — I2510 Atherosclerotic heart disease of native coronary artery without angina pectoris: Secondary | ICD-10-CM | POA: Diagnosis not present

## 2022-07-20 DIAGNOSIS — E890 Postprocedural hypothyroidism: Secondary | ICD-10-CM | POA: Diagnosis not present

## 2022-07-20 DIAGNOSIS — C73 Malignant neoplasm of thyroid gland: Secondary | ICD-10-CM | POA: Diagnosis not present

## 2022-07-20 DIAGNOSIS — M109 Gout, unspecified: Secondary | ICD-10-CM | POA: Diagnosis not present

## 2022-07-20 DIAGNOSIS — I10 Essential (primary) hypertension: Secondary | ICD-10-CM | POA: Diagnosis not present

## 2022-07-21 ENCOUNTER — Ambulatory Visit: Payer: Medicare HMO | Admitting: Speech Pathology

## 2022-07-26 ENCOUNTER — Ambulatory Visit: Payer: Medicare HMO | Admitting: Speech Pathology

## 2022-07-26 DIAGNOSIS — R490 Dysphonia: Secondary | ICD-10-CM | POA: Diagnosis not present

## 2022-07-27 DIAGNOSIS — E890 Postprocedural hypothyroidism: Secondary | ICD-10-CM | POA: Diagnosis not present

## 2022-07-27 DIAGNOSIS — Z85850 Personal history of malignant neoplasm of thyroid: Secondary | ICD-10-CM | POA: Diagnosis not present

## 2022-07-27 NOTE — Therapy (Signed)
OUTPATIENT SPEECH LANGUAGE PATHOLOGY TREATMENT NOTE   Patient Name: Keith Williamson MRN: [id number] DOB:[birth year], 76 y.o., male Today's Date: 07/27/2022  PCP: Tracie Harrier, MD REFERRING PROVIDER: Carloyn Manner, MD  END OF SESSION:   End of Session - 07/27/22 1338     Visit Number 9    Number of Visits 17    Date for SLP Re-Evaluation 08/18/22    Authorization Type [employer] HMO    Progress Note Due on Visit 10    SLP Start Time 1450    SLP Stop Time  5397    SLP Time Calculation (min) 45 min    Activity Tolerance Patient tolerated treatment well             Past Medical History:  Diagnosis Date   Arthritis    BPH (benign prostatic hypertrophy)    CAD (coronary artery disease)    Diverticulosis    Enlarged prostate    GERD (gastroesophageal reflux disease)    Headache(784.0)    frequent sinus headache   Hurthle cell tumor    Hyperlipidemia    Hypertension    Hypothyroidism    MI (myocardial infarction) ([place]) 2014   inferior; s/p PCI with stent placement to RCA   OA (osteoarthritis)    Retina hole    Senile nuclear sclerosis    Thyroid cancer ([place])    iodine tx   Valvular heart disease    Past Surgical History:  Procedure Laterality Date   ANGIOPLASTY / STENTING FEMORAL     BACK SURGERY     COLONOSCOPY WITH PROPOFOL N/A 05/07/2016   Procedure: COLONOSCOPY WITH PROPOFOL;  Surgeon: Lollie Sails, MD;  Location: [facility] ENDOSCOPY;  Service: Endoscopy;  Laterality: N/A;   COLONOSCOPY WITH PROPOFOL N/A 05/11/2022   Procedure: COLONOSCOPY WITH PROPOFOL;  Surgeon: Lin Landsman, MD;  Location: [facility] ENDOSCOPY;  Service: Gastroenterology;  Laterality: N/A;   ESOPHAGOGASTRODUODENOSCOPY (EGD) WITH PROPOFOL N/A 05/07/2016   Procedure: ESOPHAGOGASTRODUODENOSCOPY (EGD) WITH PROPOFOL;  Surgeon: Lollie Sails, MD;  Location: [facility] ENDOSCOPY;  Service: Endoscopy;  Laterality: N/A;   ESOPHAGOGASTRODUODENOSCOPY (EGD) WITH PROPOFOL N/A 04/16/2022    Procedure: ESOPHAGOGASTRODUODENOSCOPY (EGD) WITH PROPOFOL;  Surgeon: Lin Landsman, MD;  Location: [facility] ENDOSCOPY;  Service: Gastroenterology;  Laterality: N/A;   GIVENS CAPSULE STUDY N/A 06/01/2022   Procedure: GIVENS CAPSULE STUDY;  Surgeon: Lin Landsman, MD;  Location: [facility] ENDOSCOPY;  Service: Gastroenterology;  Laterality: N/A;   GREEN LIGHT LASER TURP (TRANSURETHRAL RESECTION OF PROSTATE N/A 01/01/2021   Procedure: GREEN LIGHT LASER TURP (TRANSURETHRAL RESECTION OF PROSTATE;  Surgeon: Royston Cowper, MD;  Location: [facility] ORS;  Service: Urology;  Laterality: N/A;   JOINT REPLACEMENT     KNEE ARTHROSCOPY Left    Medial and lateral meniscectomy, partial   LUMBAR LAMINECTOMY  08/16/2012   L 3 4 & 5   LUMBAR MICRODISCECTOMY     L3-L4; with partial hemilaminectomy, partial facetectomy and foraminotomy   TOTAL THYROIDECTOMY     TURP VAPORIZATION     Patient Active Problem List   Diagnosis Date Noted   Normal esophagogastroduodenoscopy (EGD)    Polyp of transverse colon    Orthostatic hypotension 04/16/2022   Melena 04/16/2022   Dyslipidemia 04/16/2022   Hypothyroidism, unspecified 04/16/2022   BPH (benign prostatic hyperplasia) 04/16/2022   Paroxysmal atrial fibrillation ([place]) 04/16/2022   Pressure injury of skin 04/16/2022   Upper abdominal pain    Essential hypertension 08/06/2021   Hyperlipidemia 08/06/2021   Hypothyroid 08/06/2021   COVID-19  virus infection 08/06/2021   Syncope 05/31/2021   Right leg DVT ([place]) 05/31/2021   Spinal stenosis, lumbar 08/07/2012    ONSET DATE: 04/28/2022  REFERRING DIAG: R49.0 (ICD-10-CM) - Dysphonia  PERTINENT HISTORY: Pt is a 76 year old male with past medical history of reflux, hiatal hernia (EGD 04/15/2022), thyroidectomy, hearing loss, COVID (August 2022), thrush (04/28/2022)   DIAGNOSTIC FINDINGS: CT Soft Tissue neck: negative; Strobovideolaryngoscopy (04/28/2022) - thrush, treated with diflucan; Laryngeal anatomy: pachydermia  and supraglottic tension (medial); Supraglottic hyperfunction: mild lateral; Glottic closure: intermittently incomplete; Strobovideolaryngoscopy (05/28/2022) - Mild bilateral bowing of vocal cords and bilateral muscle tension dysphonia with touching of false vocal folds with phonation; Laryngeal anatomy: supraglottic tension (A-p), Supraglottic hyperfunction: mild lateral  THERAPY DIAG:  Dysphonia  Muscle tension dysphonia  Rationale for Evaluation and Treatment Rehabilitation  SUBJECTIVE: Pt's wife stated, "I could him in the other room and it really sounded like him."  Pt accompanied by: significant other  PAIN:  Are you having pain? No  PATIENT GOALS: to be able to project his voice for participation in meetings and church activities  OBJECTIVE:   TODAY'S TREATMENT: Skilled treatment session focused on vocal hygiene, abdominal breathing, resonant voice therapy.   SLP engaged pt in conversation regarding church service as well as several activities that his grandchildren are engaging with. Pt with markedly improved vocal quality and intensity. He was able to sustain throughout 45 minutes complex conversation. His wife provides that pt's voice is "really coming back."   PATIENT EDUCATION: Education details: increased vocal intensity Person educated: Patient and Spouse Education method: Explanation, Demonstration, Tactile cues, Verbal cues, and Handouts Education comprehension: needs further education  GOALS: Goals reviewed with patient? Yes  SHORT TERM GOALS: Target date: 10 sessions   The patient will demonstrate abdominal breathing patterns and steady release of breath on exhalation to optimize efficiency of voicing and decrease laryngeal hyperfunction. Baseline: sub-optimal Goal status: INITIAL   2.  The patient will increase hydration for an eventual goal of 6-8 glasses per day and limit caffeine intake (to maximum of 1-2, 8 oz cups/day), as measured by patient  report. Baseline: 4 glasses Goal status: INITIAL   3.   The patient will eliminate phonotraumatic behaviors such as chronic throat clearing, by substituting non-traumatic methods to clear mucus. Baseline: habitual throat clearing Goal status: INITIAL   4.   The patient will utilize a forward tone focused/resonant voice to decrease vocal hyperfunction and improve voice quality and vocal projection.  Baseline: vocal fold hyperfunction Goal status: INITIAL  LONG TERM GOALS: Target date: 08/18/2022      The patient will eliminate phonotraumatic behaviors such as chronic throat clearing, by substituting non-traumatic methods to clear mucus. Baseline: new goal Goal status: INITIAL   2.  The patient will be independent for abdominal breathing and breath support exercises. Baseline: new goal Goal status: INITIAL   3.  The patient will demonstrate independent understanding of vocal hygiene concepts. Baseline: new goal Goal status: INITIAL   4.  The patient will maintain relaxed phonation for paragraph length recitation with 80% accuracy.  Baseline: new goal Goal status: INITIAL    ASSESSMENT:  CLINICAL IMPRESSION: Pt continues to present with improved dysphonia. As such, pt's ST frequency will be reduced to 1xweek for monitoring of carryover abilities.   OBJECTIVE IMPAIRMENTS include voice disorder. These impairments are limiting patient from household responsibilities and effectively communicating at home and in community. Factors affecting potential to achieve goals and functional outcome are severity of  impairments. Patient will benefit from skilled SLP services to address above impairments and improve overall function.  REHAB POTENTIAL: Good  PLAN: SLP FREQUENCY: 1-2x/week  SLP DURATION: 12 weeks  PLANNED INTERVENTIONS: SLP instruction and feedback and Patient/family education   Kirat Mezquita B. Rutherford Nail, M.S., CCC-SLP, Mining engineer Certified Brain Injury  [place]  [place] Office [phone] Ascom [phone] Fax [phone]

## 2022-07-28 ENCOUNTER — Ambulatory Visit: Payer: Medicare HMO | Admitting: Speech Pathology

## 2022-08-02 ENCOUNTER — Ambulatory Visit: Payer: Medicare HMO | Admitting: Speech Pathology

## 2022-08-02 DIAGNOSIS — R490 Dysphonia: Secondary | ICD-10-CM

## 2022-08-02 NOTE — Therapy (Signed)
OUTPATIENT SPEECH LANGUAGE PATHOLOGY TREATMENT NOTE 10th SESSION PROGRESS NOTE   Patient Name: Keith Williamson MRN: [id number] DOB:[birth year], 76 y.o., male Today's Date: 08/02/2022  PCP: Tracie Harrier, MD REFERRING PROVIDER: Carloyn Manner, MD  END OF SESSION:   End of Session - 08/02/22 1141     Visit Number 10    Number of Visits 17    Date for SLP Re-Evaluation 08/18/22    Authorization Type [employer] HMO    Progress Note Due on Visit 10    SLP Start Time 90    SLP Stop Time  1145    SLP Time Calculation (min) 45 min    Activity Tolerance Patient tolerated treatment well             Past Medical History:  Diagnosis Date   Arthritis    BPH (benign prostatic hypertrophy)    CAD (coronary artery disease)    Diverticulosis    Enlarged prostate    GERD (gastroesophageal reflux disease)    Headache(784.0)    frequent sinus headache   Hurthle cell tumor    Hyperlipidemia    Hypertension    Hypothyroidism    MI (myocardial infarction) ([place]) 2014   inferior; s/p PCI with stent placement to RCA   OA (osteoarthritis)    Retina hole    Senile nuclear sclerosis    Thyroid cancer ([place])    iodine tx   Valvular heart disease    Past Surgical History:  Procedure Laterality Date   ANGIOPLASTY / STENTING FEMORAL     BACK SURGERY     COLONOSCOPY WITH PROPOFOL N/A 05/07/2016   Procedure: COLONOSCOPY WITH PROPOFOL;  Surgeon: Lollie Sails, MD;  Location: [facility] ENDOSCOPY;  Service: Endoscopy;  Laterality: N/A;   COLONOSCOPY WITH PROPOFOL N/A 05/11/2022   Procedure: COLONOSCOPY WITH PROPOFOL;  Surgeon: Lin Landsman, MD;  Location: [facility] ENDOSCOPY;  Service: Gastroenterology;  Laterality: N/A;   ESOPHAGOGASTRODUODENOSCOPY (EGD) WITH PROPOFOL N/A 05/07/2016   Procedure: ESOPHAGOGASTRODUODENOSCOPY (EGD) WITH PROPOFOL;  Surgeon: Lollie Sails, MD;  Location: [facility] ENDOSCOPY;  Service: Endoscopy;  Laterality: N/A;   ESOPHAGOGASTRODUODENOSCOPY (EGD) WITH  PROPOFOL N/A 04/16/2022   Procedure: ESOPHAGOGASTRODUODENOSCOPY (EGD) WITH PROPOFOL;  Surgeon: Lin Landsman, MD;  Location: [facility] ENDOSCOPY;  Service: Gastroenterology;  Laterality: N/A;   GIVENS CAPSULE STUDY N/A 06/01/2022   Procedure: GIVENS CAPSULE STUDY;  Surgeon: Lin Landsman, MD;  Location: [facility] ENDOSCOPY;  Service: Gastroenterology;  Laterality: N/A;   GREEN LIGHT LASER TURP (TRANSURETHRAL RESECTION OF PROSTATE N/A 01/01/2021   Procedure: GREEN LIGHT LASER TURP (TRANSURETHRAL RESECTION OF PROSTATE;  Surgeon: Royston Cowper, MD;  Location: [facility] ORS;  Service: Urology;  Laterality: N/A;   JOINT REPLACEMENT     KNEE ARTHROSCOPY Left    Medial and lateral meniscectomy, partial   LUMBAR LAMINECTOMY  08/16/2012   L 3 4 & 5   LUMBAR MICRODISCECTOMY     L3-L4; with partial hemilaminectomy, partial facetectomy and foraminotomy   TOTAL THYROIDECTOMY     TURP VAPORIZATION     Patient Active Problem List   Diagnosis Date Noted   Normal esophagogastroduodenoscopy (EGD)    Polyp of transverse colon    Orthostatic hypotension 04/16/2022   Melena 04/16/2022   Dyslipidemia 04/16/2022   Hypothyroidism, unspecified 04/16/2022   BPH (benign prostatic hyperplasia) 04/16/2022   Paroxysmal atrial fibrillation ([place]) 04/16/2022   Pressure injury of skin 04/16/2022   Upper abdominal pain    Essential hypertension 08/06/2021   Hyperlipidemia 08/06/2021   Hypothyroid  08/06/2021   COVID-19 virus infection 08/06/2021   Syncope 05/31/2021   Right leg DVT ([place]) 05/31/2021   Spinal stenosis, lumbar 08/07/2012    ONSET DATE: 04/28/2022  REFERRING DIAG: R49.0 (ICD-10-CM) - Dysphonia  PERTINENT HISTORY: Pt is a 76 year old male with past medical history of reflux, hiatal hernia (EGD 04/15/2022), thyroidectomy, hearing loss, COVID (August 2022), thrush (04/28/2022)   DIAGNOSTIC FINDINGS: CT Soft Tissue neck: negative; Strobovideolaryngoscopy (04/28/2022) - thrush, treated with diflucan;  Laryngeal anatomy: pachydermia and supraglottic tension (medial); Supraglottic hyperfunction: mild lateral; Glottic closure: intermittently incomplete; Strobovideolaryngoscopy (05/28/2022) - Mild bilateral bowing of vocal cords and bilateral muscle tension dysphonia with touching of false vocal folds with phonation; Laryngeal anatomy: supraglottic tension (A-p), Supraglottic hyperfunction: mild lateral  THERAPY DIAG:  Dysphonia  Muscle tension dysphonia  Rationale for Evaluation and Treatment Rehabilitation  SUBJECTIVE: "I am tired" pt reports that he had already been to dermatology this morning  Pt accompanied by: significant other  PAIN:  Are you having pain? No  PATIENT GOALS: to be able to project his voice for participation in meetings and church activities  OBJECTIVE:   TODAY'S TREATMENT: Skilled treatment session focused on vocal hygiene, abdominal breathing, resonant voice therapy.   SLP engaged pt in conversation regarding church service on previous day.  Pt presents with vocal intensity that declines at the end of his sentences. Suspect this is related to pt's fatigue.   Pt's wife reports that out of 7 days, she might remind him to use improved vocal quality on 2 of those days. Requested wife to assess if pt is tired when requiring reminds during this week.   Also reviewed recommendations for using PPI by GI and notation of of pachydermia. Pt's wife will investigate previous recommendation further.    PATIENT EDUCATION: Education details: increased vocal intensity Person educated: Patient and Spouse Education method: Explanation, Demonstration, Tactile cues, Verbal cues, and Handouts Education comprehension: needs further education  GOALS: Goals reviewed with patient? Yes  SHORT TERM GOALS: Target date: 10 sessions   The patient will demonstrate abdominal breathing patterns and steady release of breath on exhalation to optimize efficiency of voicing and decrease  laryngeal hyperfunction. Baseline: sub-optimal Goal status: MET  2.  The patient will increase hydration for an eventual goal of 6-8 glasses per day and limit caffeine intake (to maximum of 1-2, 8 oz cups/day), as measured by patient report. Baseline: 4 glasses Goal status: MET   3.   The patient will eliminate phonotraumatic behaviors such as chronic throat clearing, by substituting non-traumatic methods to clear mucus. Baseline: habitual throat clearing Goal status: MET   4.   The patient will utilize a forward tone focused/resonant voice to decrease vocal hyperfunction and improve voice quality and vocal projection.  Baseline: vocal fold hyperfunction Goal status: MET  LONG TERM GOALS: Target date: 08/18/2022      The patient will eliminate phonotraumatic behaviors such as chronic throat clearing, by substituting non-traumatic methods to clear mucus. Baseline: new goal Goal status: MET   2.  The patient will be independent for abdominal breathing and breath support exercises. Baseline: new goal Goal status: MET   3.  The patient will demonstrate independent understanding of vocal hygiene concepts. Baseline: new goal Goal status: MET   4.  The patient will maintain relaxed phonation for paragraph length recitation with 80% accuracy.  Baseline: new goal Goal status: approaching goal level    ASSESSMENT:  CLINICAL IMPRESSION: Pt continues to present with improved dysphonia. As  such is has met all STGs and is approaching mastery of LTG.   OBJECTIVE IMPAIRMENTS include voice disorder. These impairments are limiting patient from household responsibilities and effectively communicating at home and in community. Factors affecting potential to achieve goals and functional outcome are severity of impairments. Patient will benefit from skilled SLP services to address above impairments and improve overall function.  REHAB POTENTIAL: Good  PLAN: SLP FREQUENCY: 1-2x/week  SLP  DURATION: 12 weeks  PLANNED INTERVENTIONS: SLP instruction and feedback and Patient/family education   Asier Desroches B. Rutherford Nail, M.S., CCC-SLP, Mining engineer Certified Brain Injury [place]  [place] Office [phone] Ascom [phone] Fax [phone]

## 2022-08-04 ENCOUNTER — Encounter: Payer: Medicare HMO | Admitting: Speech Pathology

## 2022-08-09 ENCOUNTER — Ambulatory Visit: Payer: Medicare HMO | Admitting: Speech Pathology

## 2022-08-11 ENCOUNTER — Encounter: Payer: Medicare HMO | Admitting: Speech Pathology

## 2022-08-18 ENCOUNTER — Encounter: Payer: Medicare HMO | Admitting: Speech Pathology

## 2022-08-23 ENCOUNTER — Ambulatory Visit: Payer: Medicare HMO | Attending: Otolaryngology | Admitting: Speech Pathology

## 2022-08-23 DIAGNOSIS — R490 Dysphonia: Secondary | ICD-10-CM | POA: Diagnosis not present

## 2022-08-23 NOTE — Therapy (Signed)
OUTPATIENT SPEECH LANGUAGE PATHOLOGY TREATMENT NOTE DISCHARGE SUMMARY Re-certification   Patient Name: Keith Williamson MRN: [id number] DOB:1946-08-26, 76 y.o., male Today's Date: 08/23/2022  PCP: Tracie Harrier, MD REFERRING PROVIDER: Carloyn Manner, MD  END OF SESSION:   End of Session - 08/23/22 1015     Visit Number 11    Number of Visits 17    Date for SLP Re-Evaluation 08/30/22    Authorization Type [employer] HMO    Progress Note Due on Visit 10    SLP Start Time 1000    SLP Stop Time  1100    SLP Time Calculation (min) 60 min    Activity Tolerance Patient tolerated treatment well             Past Medical History:  Diagnosis Date   Arthritis    BPH (benign prostatic hypertrophy)    CAD (coronary artery disease)    Diverticulosis    Enlarged prostate    GERD (gastroesophageal reflux disease)    Headache(784.0)    frequent sinus headache   Hurthle cell tumor    Hyperlipidemia    Hypertension    Hypothyroidism    MI (myocardial infarction) ([place]) 2014   inferior; s/p PCI with stent placement to RCA   OA (osteoarthritis)    Retina hole    Senile nuclear sclerosis    Thyroid cancer ([place])    iodine tx   Valvular heart disease    Past Surgical History:  Procedure Laterality Date   ANGIOPLASTY / STENTING FEMORAL     BACK SURGERY     COLONOSCOPY WITH PROPOFOL N/A 05/07/2016   Procedure: COLONOSCOPY WITH PROPOFOL;  Surgeon: Lollie Sails, MD;  Location[facility] ENDOSCOPY;  Service: Endoscopy;  Laterality: N/A;   COLONOSCOPY WITH PROPOFOL N/A 05/11/2022   Procedure: COLONOSCOPY WITH PROPOFOL;  Surgeon: Lin Landsman, MD;  Location: [facility] ENDOSCOPY;  Service: Gastroenterology;  Laterality: N/A;   ESOPHAGOGASTRODUODENOSCOPY (EGD) WITH PROPOFOL N/A 05/07/2016   Procedure: ESOPHAGOGASTRODUODENOSCOPY (EGD) WITH PROPOFOL;  Surgeon: Lollie Sails, MD;  Location: [facility] ENDOSCOPY;  Service: Endoscopy;  Laterality: N/A;   ESOPHAGOGASTRODUODENOSCOPY  (EGD) WITH PROPOFOL N/A 04/16/2022   Procedure: ESOPHAGOGASTRODUODENOSCOPY (EGD) WITH PROPOFOL;  Surgeon: Lin Landsman, MD;  Location: [facility] ENDOSCOPY;  Service: Gastroenterology;  Laterality: N/A;   GIVENS CAPSULE STUDY N/A 06/01/2022   Procedure: GIVENS CAPSULE STUDY;  Surgeon: Lin Landsman, MD;  Location: [facility] ENDOSCOPY;  Service: Gastroenterology;  Laterality: N/A;   GREEN LIGHT LASER TURP (TRANSURETHRAL RESECTION OF PROSTATE N/A 01/01/2021   Procedure: GREEN LIGHT LASER TURP (TRANSURETHRAL RESECTION OF PROSTATE;  Surgeon: Royston Cowper, MD;  Location: [facility] ORS;  Service: Urology;  Laterality: N/A;   JOINT REPLACEMENT     KNEE ARTHROSCOPY Left    Medial and lateral meniscectomy, partial   LUMBAR LAMINECTOMY  08/16/2012   L 3 4 & 5   LUMBAR MICRODISCECTOMY     L3-L4; with partial hemilaminectomy, partial facetectomy and foraminotomy   TOTAL THYROIDECTOMY     TURP VAPORIZATION     Patient Active Problem List   Diagnosis Date Noted   Normal esophagogastroduodenoscopy (EGD)    Polyp of transverse colon    Orthostatic hypotension 04/16/2022   Melena 04/16/2022   Dyslipidemia 04/16/2022   Hypothyroidism, unspecified 04/16/2022   BPH (benign prostatic hyperplasia) 04/16/2022   Paroxysmal atrial fibrillation ([place]) 04/16/2022   Pressure injury of skin 04/16/2022   Upper abdominal pain    Essential hypertension 08/06/2021   Hyperlipidemia 08/06/2021   Hypothyroid 08/06/2021  COVID-19 virus infection 08/06/2021   Syncope 05/31/2021   Right leg DVT ([place]) 05/31/2021   Spinal stenosis, lumbar 08/07/2012    ONSET DATE: 04/28/2022  REFERRING DIAG: R49.0 (ICD-10-CM) - Dysphonia  PERTINENT HISTORY: Pt is a 76 year old male with past medical history of reflux, hiatal hernia (EGD 04/15/2022), thyroidectomy, hearing loss, COVID (August 2022), thrush (04/28/2022)   DIAGNOSTIC FINDINGS: CT Soft Tissue neck: negative; Strobovideolaryngoscopy (04/28/2022) - thrush, treated with  diflucan; Laryngeal anatomy: pachydermia and supraglottic tension (medial); Supraglottic hyperfunction: mild lateral; Glottic closure: intermittently incomplete; Strobovideolaryngoscopy (05/28/2022) - Mild bilateral bowing of vocal cords and bilateral muscle tension dysphonia with touching of false vocal folds with phonation; Laryngeal anatomy: supraglottic tension (A-p), Supraglottic hyperfunction: mild lateral  THERAPY DIAG:  Dysphonia  Muscle tension dysphonia  Rationale for Evaluation and Treatment Rehabilitation  SUBJECTIVE: pt spoke during the opening to his church's revival meeting and his voice sounded great, all could hear him speak  Pt accompanied by: significant other  PAIN:  Are you having pain? No  PATIENT GOALS: to be able to project his voice for participation in meetings and church activities  OBJECTIVE:   TODAY'S TREATMENT: Skilled treatment session focused on vocal hygiene, abdominal breathing, resonant voice therapy.   SLP engaged pt in conversation regarding church service on previous day.  Pt presents with vocal intensity that declines at the end of his sentences. Suspect this is related to pt's fatigue.   Pt's wife reports that out of 7 days, she might remind him to use improved vocal quality on 2 of those days. Requested wife to assess if pt is tired when requiring reminds during this week.   Also reviewed recommendations for using PPI by GI and notation of of pachydermia. Pt's wife will investigate previous recommendation further.   PATIENT REPORTED OUTCOME MEASURES (PROM): VHI Score: The Voice Handicap Index is comprised of a series of questions to assess the patient's perception of their voice. It is designed to evaluate the emotional, physical and functional components of the voice problem.   08/23/2022 Improved over 06/23/2022 Functional: 1 Physical: 2 Emotional: 0 Total: 3 (Normal mean 8.75, SD =14.97)   06/23/2022 - time of evaluation Functional:  45 Physical: 38 Emotional: 39 Total: 122 (Normal mean 8.75, SD =14.97) z score = 7.6 - severe = 3.00+     PATIENT EDUCATION: Education details: forever HEP Person educated: Patient and Spouse Education method: Explanation, Media planner, Corporate treasurer cues, Verbal cues, and Handouts Education comprehension: needs further education  GOALS: Goals reviewed with patient? Yes  SHORT TERM GOALS: Target date: 10 sessions   The patient will demonstrate abdominal breathing patterns and steady release of breath on exhalation to optimize efficiency of voicing and decrease laryngeal hyperfunction. Baseline: sub-optimal Goal status: MET  2.  The patient will increase hydration for an eventual goal of 6-8 glasses per day and limit caffeine intake (to maximum of 1-2, 8 oz cups/day), as measured by patient report. Baseline: 4 glasses Goal status: MET   3.   The patient will eliminate phonotraumatic behaviors such as chronic throat clearing, by substituting non-traumatic methods to clear mucus. Baseline: habitual throat clearing Goal status: MET   4.   The patient will utilize a forward tone focused/resonant voice to decrease vocal hyperfunction and improve voice quality and vocal projection.  Baseline: vocal fold hyperfunction Goal status: MET  LONG TERM GOALS: Target date: 08/18/2022      The patient will eliminate phonotraumatic behaviors such as chronic throat clearing, by substituting non-traumatic  methods to clear mucus. Baseline: new goal Goal status: MET   2.  The patient will be independent for abdominal breathing and breath support exercises. Baseline: new goal Goal status: MET   3.  The patient will demonstrate independent understanding of vocal hygiene concepts. Baseline: new goal Goal status: MET   4.  The patient will maintain relaxed phonation for paragraph length recitation with 80% accuracy.  Baseline: new goal Goal status: MET    ASSESSMENT:  CLINICAL IMPRESSION: Pt  presents with greatly improved vocal abilities. Currently his pitch during conversational speech is 143 HZ and his loudness is 78dB. As such, he has met all of his LTGs with all education being completed.    Aurelia Gras B. Rutherford Nail, M.S., CCC-SLP, Mining engineer Certified Brain Injury [place]  [place] Office [phone] Ascom [phone] Fax [phone]

## 2022-08-26 ENCOUNTER — Encounter: Payer: Medicare HMO | Admitting: Speech Pathology

## 2022-09-02 ENCOUNTER — Encounter: Payer: Medicare HMO | Admitting: Speech Pathology

## 2022-09-06 ENCOUNTER — Encounter: Payer: Medicare HMO | Admitting: Speech Pathology

## 2022-09-08 ENCOUNTER — Encounter: Payer: Medicare HMO | Admitting: Speech Pathology

## 2022-09-13 ENCOUNTER — Encounter: Payer: Medicare HMO | Admitting: Speech Pathology

## 2022-09-15 ENCOUNTER — Encounter: Payer: Medicare HMO | Admitting: Speech Pathology

## 2022-09-20 ENCOUNTER — Encounter: Payer: Medicare HMO | Admitting: Speech Pathology

## 2022-09-22 ENCOUNTER — Encounter: Payer: Medicare HMO | Admitting: Speech Pathology

## 2022-10-06 DIAGNOSIS — I480 Paroxysmal atrial fibrillation: Secondary | ICD-10-CM | POA: Diagnosis not present

## 2022-10-06 DIAGNOSIS — E782 Mixed hyperlipidemia: Secondary | ICD-10-CM | POA: Diagnosis not present

## 2022-10-06 DIAGNOSIS — I2510 Atherosclerotic heart disease of native coronary artery without angina pectoris: Secondary | ICD-10-CM | POA: Diagnosis not present

## 2022-10-06 DIAGNOSIS — I119 Hypertensive heart disease without heart failure: Secondary | ICD-10-CM | POA: Diagnosis not present

## 2022-10-06 DIAGNOSIS — I10 Essential (primary) hypertension: Secondary | ICD-10-CM | POA: Diagnosis not present

## 2022-10-25 DIAGNOSIS — S32461A Displaced associated transverse-posterior fracture of right acetabulum, initial encounter for closed fracture: Secondary | ICD-10-CM | POA: Diagnosis not present

## 2022-10-25 DIAGNOSIS — M25852 Other specified joint disorders, left hip: Secondary | ICD-10-CM | POA: Diagnosis not present

## 2022-10-25 DIAGNOSIS — Z96641 Presence of right artificial hip joint: Secondary | ICD-10-CM | POA: Diagnosis not present

## 2022-10-25 DIAGNOSIS — M25851 Other specified joint disorders, right hip: Secondary | ICD-10-CM | POA: Diagnosis not present

## 2022-10-25 DIAGNOSIS — M85851 Other specified disorders of bone density and structure, right thigh: Secondary | ICD-10-CM | POA: Diagnosis not present

## 2022-11-10 DIAGNOSIS — B338 Other specified viral diseases: Secondary | ICD-10-CM | POA: Diagnosis not present

## 2022-11-10 DIAGNOSIS — Z03818 Encounter for observation for suspected exposure to other biological agents ruled out: Secondary | ICD-10-CM | POA: Diagnosis not present

## 2022-11-10 DIAGNOSIS — J0100 Acute maxillary sinusitis, unspecified: Secondary | ICD-10-CM | POA: Diagnosis not present

## 2022-11-25 DIAGNOSIS — Z85850 Personal history of malignant neoplasm of thyroid: Secondary | ICD-10-CM | POA: Diagnosis not present

## 2022-11-25 DIAGNOSIS — Z8739 Personal history of other diseases of the musculoskeletal system and connective tissue: Secondary | ICD-10-CM | POA: Diagnosis not present

## 2022-11-25 DIAGNOSIS — I10 Essential (primary) hypertension: Secondary | ICD-10-CM | POA: Diagnosis not present

## 2022-11-25 DIAGNOSIS — K2100 Gastro-esophageal reflux disease with esophagitis, without bleeding: Secondary | ICD-10-CM | POA: Diagnosis not present

## 2022-11-25 DIAGNOSIS — E039 Hypothyroidism, unspecified: Secondary | ICD-10-CM | POA: Diagnosis not present

## 2022-11-25 DIAGNOSIS — I480 Paroxysmal atrial fibrillation: Secondary | ICD-10-CM | POA: Diagnosis not present

## 2022-11-25 DIAGNOSIS — I2510 Atherosclerotic heart disease of native coronary artery without angina pectoris: Secondary | ICD-10-CM | POA: Diagnosis not present

## 2022-12-02 DIAGNOSIS — I10 Essential (primary) hypertension: Secondary | ICD-10-CM | POA: Diagnosis not present

## 2022-12-02 DIAGNOSIS — I480 Paroxysmal atrial fibrillation: Secondary | ICD-10-CM | POA: Diagnosis not present

## 2022-12-02 DIAGNOSIS — Z85850 Personal history of malignant neoplasm of thyroid: Secondary | ICD-10-CM | POA: Diagnosis not present

## 2022-12-02 DIAGNOSIS — I2510 Atherosclerotic heart disease of native coronary artery without angina pectoris: Secondary | ICD-10-CM | POA: Diagnosis not present

## 2022-12-23 ENCOUNTER — Ambulatory Visit: Payer: Medicare HMO | Admitting: Urology

## 2022-12-23 ENCOUNTER — Encounter: Payer: Self-pay | Admitting: Urology

## 2022-12-23 VITALS — BP 168/101 | HR 73 | Ht 74.0 in | Wt 226.8 lb

## 2022-12-23 DIAGNOSIS — N401 Enlarged prostate with lower urinary tract symptoms: Secondary | ICD-10-CM | POA: Diagnosis not present

## 2022-12-23 DIAGNOSIS — Z125 Encounter for screening for malignant neoplasm of prostate: Secondary | ICD-10-CM

## 2022-12-23 DIAGNOSIS — C61 Malignant neoplasm of prostate: Secondary | ICD-10-CM | POA: Diagnosis not present

## 2022-12-23 DIAGNOSIS — N138 Other obstructive and reflux uropathy: Secondary | ICD-10-CM | POA: Diagnosis not present

## 2022-12-23 MED ORDER — DUTASTERIDE 0.5 MG PO CAPS: 0.5000 mg | ORAL_CAPSULE | Freq: Every day | ORAL | 3 refills | Status: DC

## 2022-12-23 NOTE — Patient Instructions (Signed)

## 2022-12-23 NOTE — Progress Notes (Signed)
12/23/22 10:24 AM   Keith Williamson 1946-10-23 [id number]  CC: BPH, history of elevated PSA  HPI: 77 year old male here with his wife today, she provides most of the history as patient is quite hard of hearing.  He previously was followed by Dr. Eliberto Ivory, and is transferring his care here after his retirement.  I reviewed the outside notes from Dr. Eliberto Ivory.  He had a rising PSA as high as 14, which prompted a prostate MRI in December 2021, this showed a 120 g gland with no worrisome lesions.  He underwent a greenlight laser PVP with Dr. Eliberto Ivory for obstructive urinary symptoms in January 2022, and he reports he has been overall urinating well since that time.  He remains on dutasteride, and PSA has decreased on that medication, most recently 1.9(corrected for finasteride 3.8) in January 2023.  He denies any urinary symptoms, gross hematuria, or UTIs.  He has a family history of lethal prostate cancer, and they are adamant about continuing PSA screening.   PMH: Past Medical History:  Diagnosis Date   Arthritis    BPH (benign prostatic hypertrophy)    CAD (coronary artery disease)    Diverticulosis    Enlarged prostate    GERD (gastroesophageal reflux disease)    Headache(784.0)    frequent sinus headache   Hurthle cell tumor    Hyperlipidemia    Hypertension    Hypothyroidism    MI (myocardial infarction) ([place]) 2014   inferior; s/p PCI with stent placement to RCA   OA (osteoarthritis)    Retina hole    Senile nuclear sclerosis    Thyroid cancer ([place])    iodine tx   Valvular heart disease     Surgical History: Past Surgical History:  Procedure Laterality Date   ANGIOPLASTY / STENTING FEMORAL     BACK SURGERY     COLONOSCOPY WITH PROPOFOL N/A 05/07/2016   Procedure: COLONOSCOPY WITH PROPOFOL;  Surgeon: Lollie Sails, MD;  Location: [facility] ENDOSCOPY;  Service: Endoscopy;  Laterality: N/A;   COLONOSCOPY WITH PROPOFOL N/A 05/11/2022   Procedure: COLONOSCOPY WITH PROPOFOL;  Surgeon:  Lin Landsman, MD;  Location: [facility] ENDOSCOPY;  Service: Gastroenterology;  Laterality: N/A;   ESOPHAGOGASTRODUODENOSCOPY (EGD) WITH PROPOFOL N/A 05/07/2016   Procedure: ESOPHAGOGASTRODUODENOSCOPY (EGD) WITH PROPOFOL;  Surgeon: Lollie Sails, MD;  Location: [facility] ENDOSCOPY;  Service: Endoscopy;  Laterality: N/A;   ESOPHAGOGASTRODUODENOSCOPY (EGD) WITH PROPOFOL N/A 04/16/2022   Procedure: ESOPHAGOGASTRODUODENOSCOPY (EGD) WITH PROPOFOL;  Surgeon: Lin Landsman, MD;  Location: [facility] ENDOSCOPY;  Service: Gastroenterology;  Laterality: N/A;   GIVENS CAPSULE STUDY N/A 06/01/2022   Procedure: GIVENS CAPSULE STUDY;  Surgeon: Lin Landsman, MD;  Location: [facility] ENDOSCOPY;  Service: Gastroenterology;  Laterality: N/A;   GREEN LIGHT LASER TURP (TRANSURETHRAL RESECTION OF PROSTATE N/A 01/01/2021   Procedure: GREEN LIGHT LASER TURP (TRANSURETHRAL RESECTION OF PROSTATE;  Surgeon: Royston Cowper, MD;  Location: [facility] ORS;  Service: Urology;  Laterality: N/A;   JOINT REPLACEMENT     KNEE ARTHROSCOPY Left    Medial and lateral meniscectomy, partial   LUMBAR LAMINECTOMY  08/16/2012   L 3 4 & 5   LUMBAR MICRODISCECTOMY     L3-L4; with partial hemilaminectomy, partial facetectomy and foraminotomy   TOTAL THYROIDECTOMY     TURP VAPORIZATION        Family History: No family history on file.  Social History:  reports that he quit smoking about 41 years ago. His smoking use included cigarettes. He has never used smokeless  tobacco. He reports that he does not drink alcohol and does not use drugs.  Physical Exam: BP (!) 168/101 (BP Location: Left Arm, Patient Position: Sitting, Cuff Size: Large)   Pulse 73   Ht '6\' 2"'$  (1.88 m)   Wt 226 lb 12.8 oz (102.9 kg)   BMI 29.12 kg/m    Constitutional:  Alert and oriented, No acute distress.  Hard of hearing Cardiovascular: No clubbing, cyanosis, or edema. Respiratory: Normal respiratory effort, no increased work of breathing. GI: Abdomen is soft,  nontender, nondistended, no abdominal masses   Laboratory Data: PSA history reviewed, see HPI  Pertinent Imaging: I have personally viewed and interpreted the prostate MRI from December 2021 showing a 120 g gland, no suspicious lesions, as well as the CT abdomen and pelvis from January 2023 with no urologic abnormalities, prostate not clearly visualized in the setting of prosthesis.  Assessment & Plan:   77 year old male with history of elevated PSA as high as 14, negative prostate MRI in December 2021 with 120 g gland, greenlight laser PVP with Dr. Eliberto Ivory in January 2022, no urinary complaints today, remains on dutasteride.  Most recent PSA in January 2023 1.9, corrected for dutasteride 3.8.  Well within the normal range for his age, especially with his history of a significantly large prostate with benign MRI.  We reviewed the AUA guidelines that do not recommend routine screening in men over age 2, however they are adamant they would like him to continue to get yearly PSAs.  Very low suspicion for prostate cancer, and with his age and comorbidities would hesitate to pursue any aggressive intervention even if PSA begins to rise.  -Continue dutasteride, refilled -PSA today, call with results -RTC 1 year PVR, PSA prior  Nickolas Madrid, MD 12/23/2022  Hand [address], [place], [place] [address] [phone]

## 2022-12-24 ENCOUNTER — Telehealth: Payer: Self-pay

## 2022-12-24 LAB — PSA: Prostate Specific Ag, Serum: 2.6 ng/mL (ref 0.0–4.0)

## 2022-12-24 NOTE — Telephone Encounter (Signed)
Called pt informed him of the information below. Pt voiced understanding.

## 2022-12-24 NOTE — Telephone Encounter (Signed)
-----  Message from Billey Co, MD sent at 12/24/2022 10:51 AM EST ----- Good news, PSA normal.  Keep follow-up as scheduled in 1 year, they would like to continue PSA screening and he can get a PSA prior  Nickolas Madrid, MD 12/24/2022

## 2023-04-13 DIAGNOSIS — I10 Essential (primary) hypertension: Secondary | ICD-10-CM | POA: Diagnosis not present

## 2023-04-13 DIAGNOSIS — I480 Paroxysmal atrial fibrillation: Secondary | ICD-10-CM | POA: Diagnosis not present

## 2023-04-13 DIAGNOSIS — I700 Atherosclerosis of aorta: Secondary | ICD-10-CM | POA: Diagnosis not present

## 2023-04-13 DIAGNOSIS — I2510 Atherosclerotic heart disease of native coronary artery without angina pectoris: Secondary | ICD-10-CM | POA: Diagnosis not present

## 2023-04-13 DIAGNOSIS — E782 Mixed hyperlipidemia: Secondary | ICD-10-CM | POA: Diagnosis not present

## 2023-04-13 DIAGNOSIS — I119 Hypertensive heart disease without heart failure: Secondary | ICD-10-CM | POA: Diagnosis not present

## 2023-04-13 DIAGNOSIS — Z955 Presence of coronary angioplasty implant and graft: Secondary | ICD-10-CM | POA: Diagnosis not present

## 2023-06-10 DIAGNOSIS — Z96641 Presence of right artificial hip joint: Secondary | ICD-10-CM | POA: Diagnosis not present

## 2023-06-10 DIAGNOSIS — S32461A Displaced associated transverse-posterior fracture of right acetabulum, initial encounter for closed fracture: Secondary | ICD-10-CM | POA: Diagnosis not present

## 2023-06-10 DIAGNOSIS — M1651 Unilateral post-traumatic osteoarthritis, right hip: Secondary | ICD-10-CM | POA: Diagnosis not present

## 2023-06-10 DIAGNOSIS — S32461D Displaced associated transverse-posterior fracture of right acetabulum, subsequent encounter for fracture with routine healing: Secondary | ICD-10-CM | POA: Diagnosis not present

## 2023-06-30 DIAGNOSIS — R2689 Other abnormalities of gait and mobility: Secondary | ICD-10-CM | POA: Diagnosis not present

## 2023-06-30 DIAGNOSIS — M1651 Unilateral post-traumatic osteoarthritis, right hip: Secondary | ICD-10-CM | POA: Diagnosis not present

## 2023-06-30 DIAGNOSIS — S32461A Displaced associated transverse-posterior fracture of right acetabulum, initial encounter for closed fracture: Secondary | ICD-10-CM | POA: Diagnosis not present

## 2023-06-30 DIAGNOSIS — M25551 Pain in right hip: Secondary | ICD-10-CM | POA: Diagnosis not present

## 2023-06-30 DIAGNOSIS — Z96641 Presence of right artificial hip joint: Secondary | ICD-10-CM | POA: Diagnosis not present

## 2023-07-05 DIAGNOSIS — S32461A Displaced associated transverse-posterior fracture of right acetabulum, initial encounter for closed fracture: Secondary | ICD-10-CM | POA: Diagnosis not present

## 2023-07-05 DIAGNOSIS — M1651 Unilateral post-traumatic osteoarthritis, right hip: Secondary | ICD-10-CM | POA: Diagnosis not present

## 2023-07-05 DIAGNOSIS — Z96641 Presence of right artificial hip joint: Secondary | ICD-10-CM | POA: Diagnosis not present

## 2023-07-05 DIAGNOSIS — M25551 Pain in right hip: Secondary | ICD-10-CM | POA: Diagnosis not present

## 2023-07-05 DIAGNOSIS — R2689 Other abnormalities of gait and mobility: Secondary | ICD-10-CM | POA: Diagnosis not present

## 2023-07-08 DIAGNOSIS — R2689 Other abnormalities of gait and mobility: Secondary | ICD-10-CM | POA: Diagnosis not present

## 2023-07-08 DIAGNOSIS — M25551 Pain in right hip: Secondary | ICD-10-CM | POA: Diagnosis not present

## 2023-07-08 DIAGNOSIS — M1651 Unilateral post-traumatic osteoarthritis, right hip: Secondary | ICD-10-CM | POA: Diagnosis not present

## 2023-07-08 DIAGNOSIS — S32461A Displaced associated transverse-posterior fracture of right acetabulum, initial encounter for closed fracture: Secondary | ICD-10-CM | POA: Diagnosis not present

## 2023-07-08 DIAGNOSIS — Z96641 Presence of right artificial hip joint: Secondary | ICD-10-CM | POA: Diagnosis not present

## 2023-07-12 DIAGNOSIS — M25551 Pain in right hip: Secondary | ICD-10-CM | POA: Diagnosis not present

## 2023-07-12 DIAGNOSIS — Z96641 Presence of right artificial hip joint: Secondary | ICD-10-CM | POA: Diagnosis not present

## 2023-07-12 DIAGNOSIS — R2689 Other abnormalities of gait and mobility: Secondary | ICD-10-CM | POA: Diagnosis not present

## 2023-07-12 DIAGNOSIS — S32461A Displaced associated transverse-posterior fracture of right acetabulum, initial encounter for closed fracture: Secondary | ICD-10-CM | POA: Diagnosis not present

## 2023-07-12 DIAGNOSIS — M1651 Unilateral post-traumatic osteoarthritis, right hip: Secondary | ICD-10-CM | POA: Diagnosis not present

## 2023-07-19 DIAGNOSIS — R2689 Other abnormalities of gait and mobility: Secondary | ICD-10-CM | POA: Diagnosis not present

## 2023-07-19 DIAGNOSIS — Z96641 Presence of right artificial hip joint: Secondary | ICD-10-CM | POA: Diagnosis not present

## 2023-07-19 DIAGNOSIS — M25551 Pain in right hip: Secondary | ICD-10-CM | POA: Diagnosis not present

## 2023-07-19 DIAGNOSIS — S32461A Displaced associated transverse-posterior fracture of right acetabulum, initial encounter for closed fracture: Secondary | ICD-10-CM | POA: Diagnosis not present

## 2023-07-19 DIAGNOSIS — M1651 Unilateral post-traumatic osteoarthritis, right hip: Secondary | ICD-10-CM | POA: Diagnosis not present

## 2023-07-22 DIAGNOSIS — M25551 Pain in right hip: Secondary | ICD-10-CM | POA: Diagnosis not present

## 2023-07-22 DIAGNOSIS — M1651 Unilateral post-traumatic osteoarthritis, right hip: Secondary | ICD-10-CM | POA: Diagnosis not present

## 2023-07-22 DIAGNOSIS — Z96641 Presence of right artificial hip joint: Secondary | ICD-10-CM | POA: Diagnosis not present

## 2023-07-22 DIAGNOSIS — R2689 Other abnormalities of gait and mobility: Secondary | ICD-10-CM | POA: Diagnosis not present

## 2023-07-22 DIAGNOSIS — S32461A Displaced associated transverse-posterior fracture of right acetabulum, initial encounter for closed fracture: Secondary | ICD-10-CM | POA: Diagnosis not present

## 2023-07-29 DIAGNOSIS — S32461A Displaced associated transverse-posterior fracture of right acetabulum, initial encounter for closed fracture: Secondary | ICD-10-CM | POA: Diagnosis not present

## 2023-07-29 DIAGNOSIS — Z96641 Presence of right artificial hip joint: Secondary | ICD-10-CM | POA: Diagnosis not present

## 2023-07-29 DIAGNOSIS — M25551 Pain in right hip: Secondary | ICD-10-CM | POA: Diagnosis not present

## 2023-07-29 DIAGNOSIS — M1651 Unilateral post-traumatic osteoarthritis, right hip: Secondary | ICD-10-CM | POA: Diagnosis not present

## 2023-07-29 DIAGNOSIS — R2689 Other abnormalities of gait and mobility: Secondary | ICD-10-CM | POA: Diagnosis not present

## 2023-08-10 DIAGNOSIS — S32461A Displaced associated transverse-posterior fracture of right acetabulum, initial encounter for closed fracture: Secondary | ICD-10-CM | POA: Diagnosis not present

## 2023-08-10 DIAGNOSIS — R2689 Other abnormalities of gait and mobility: Secondary | ICD-10-CM | POA: Diagnosis not present

## 2023-08-10 DIAGNOSIS — M25551 Pain in right hip: Secondary | ICD-10-CM | POA: Diagnosis not present

## 2023-08-10 DIAGNOSIS — M1651 Unilateral post-traumatic osteoarthritis, right hip: Secondary | ICD-10-CM | POA: Diagnosis not present

## 2023-08-10 DIAGNOSIS — Z96641 Presence of right artificial hip joint: Secondary | ICD-10-CM | POA: Diagnosis not present

## 2023-08-19 DIAGNOSIS — M25551 Pain in right hip: Secondary | ICD-10-CM | POA: Diagnosis not present

## 2023-08-19 DIAGNOSIS — S32461A Displaced associated transverse-posterior fracture of right acetabulum, initial encounter for closed fracture: Secondary | ICD-10-CM | POA: Diagnosis not present

## 2023-08-19 DIAGNOSIS — R2689 Other abnormalities of gait and mobility: Secondary | ICD-10-CM | POA: Diagnosis not present

## 2023-08-19 DIAGNOSIS — M1651 Unilateral post-traumatic osteoarthritis, right hip: Secondary | ICD-10-CM | POA: Diagnosis not present

## 2023-08-19 DIAGNOSIS — Z96641 Presence of right artificial hip joint: Secondary | ICD-10-CM | POA: Diagnosis not present

## 2023-08-24 DIAGNOSIS — Z85850 Personal history of malignant neoplasm of thyroid: Secondary | ICD-10-CM | POA: Diagnosis not present

## 2023-08-24 DIAGNOSIS — E890 Postprocedural hypothyroidism: Secondary | ICD-10-CM | POA: Diagnosis not present

## 2023-08-26 DIAGNOSIS — M1651 Unilateral post-traumatic osteoarthritis, right hip: Secondary | ICD-10-CM | POA: Diagnosis not present

## 2023-08-26 DIAGNOSIS — Z96641 Presence of right artificial hip joint: Secondary | ICD-10-CM | POA: Diagnosis not present

## 2023-08-26 DIAGNOSIS — M25551 Pain in right hip: Secondary | ICD-10-CM | POA: Diagnosis not present

## 2023-08-26 DIAGNOSIS — S32461A Displaced associated transverse-posterior fracture of right acetabulum, initial encounter for closed fracture: Secondary | ICD-10-CM | POA: Diagnosis not present

## 2023-08-26 DIAGNOSIS — R2689 Other abnormalities of gait and mobility: Secondary | ICD-10-CM | POA: Diagnosis not present

## 2023-08-29 DIAGNOSIS — E890 Postprocedural hypothyroidism: Secondary | ICD-10-CM | POA: Diagnosis not present

## 2023-08-29 DIAGNOSIS — Z85850 Personal history of malignant neoplasm of thyroid: Secondary | ICD-10-CM | POA: Diagnosis not present

## 2023-09-02 DIAGNOSIS — Z96641 Presence of right artificial hip joint: Secondary | ICD-10-CM | POA: Diagnosis not present

## 2023-09-02 DIAGNOSIS — M1651 Unilateral post-traumatic osteoarthritis, right hip: Secondary | ICD-10-CM | POA: Diagnosis not present

## 2023-09-02 DIAGNOSIS — R2689 Other abnormalities of gait and mobility: Secondary | ICD-10-CM | POA: Diagnosis not present

## 2023-09-02 DIAGNOSIS — M25551 Pain in right hip: Secondary | ICD-10-CM | POA: Diagnosis not present

## 2023-09-02 DIAGNOSIS — S32461A Displaced associated transverse-posterior fracture of right acetabulum, initial encounter for closed fracture: Secondary | ICD-10-CM | POA: Diagnosis not present

## 2023-09-07 DIAGNOSIS — L728 Other follicular cysts of the skin and subcutaneous tissue: Secondary | ICD-10-CM | POA: Diagnosis not present

## 2023-09-13 DIAGNOSIS — M898X5 Other specified disorders of bone, thigh: Secondary | ICD-10-CM | POA: Diagnosis not present

## 2023-09-13 DIAGNOSIS — Z96641 Presence of right artificial hip joint: Secondary | ICD-10-CM | POA: Diagnosis not present

## 2023-09-13 DIAGNOSIS — M25551 Pain in right hip: Secondary | ICD-10-CM | POA: Diagnosis not present

## 2023-09-13 DIAGNOSIS — M76891 Other specified enthesopathies of right lower limb, excluding foot: Secondary | ICD-10-CM | POA: Diagnosis not present

## 2023-09-16 DIAGNOSIS — R2689 Other abnormalities of gait and mobility: Secondary | ICD-10-CM | POA: Diagnosis not present

## 2023-09-16 DIAGNOSIS — M1651 Unilateral post-traumatic osteoarthritis, right hip: Secondary | ICD-10-CM | POA: Diagnosis not present

## 2023-09-16 DIAGNOSIS — Z96641 Presence of right artificial hip joint: Secondary | ICD-10-CM | POA: Diagnosis not present

## 2023-09-16 DIAGNOSIS — S32461A Displaced associated transverse-posterior fracture of right acetabulum, initial encounter for closed fracture: Secondary | ICD-10-CM | POA: Diagnosis not present

## 2023-09-16 DIAGNOSIS — M25551 Pain in right hip: Secondary | ICD-10-CM | POA: Diagnosis not present

## 2023-09-21 DIAGNOSIS — M1651 Unilateral post-traumatic osteoarthritis, right hip: Secondary | ICD-10-CM | POA: Diagnosis not present

## 2023-09-21 DIAGNOSIS — S32461A Displaced associated transverse-posterior fracture of right acetabulum, initial encounter for closed fracture: Secondary | ICD-10-CM | POA: Diagnosis not present

## 2023-09-21 DIAGNOSIS — Z96641 Presence of right artificial hip joint: Secondary | ICD-10-CM | POA: Diagnosis not present

## 2023-09-21 DIAGNOSIS — R2689 Other abnormalities of gait and mobility: Secondary | ICD-10-CM | POA: Diagnosis not present

## 2023-09-21 DIAGNOSIS — M25551 Pain in right hip: Secondary | ICD-10-CM | POA: Diagnosis not present

## 2023-09-23 DIAGNOSIS — R2689 Other abnormalities of gait and mobility: Secondary | ICD-10-CM | POA: Diagnosis not present

## 2023-09-23 DIAGNOSIS — Z96641 Presence of right artificial hip joint: Secondary | ICD-10-CM | POA: Diagnosis not present

## 2023-09-23 DIAGNOSIS — M25551 Pain in right hip: Secondary | ICD-10-CM | POA: Diagnosis not present

## 2023-09-23 DIAGNOSIS — S32461A Displaced associated transverse-posterior fracture of right acetabulum, initial encounter for closed fracture: Secondary | ICD-10-CM | POA: Diagnosis not present

## 2023-09-23 DIAGNOSIS — M1651 Unilateral post-traumatic osteoarthritis, right hip: Secondary | ICD-10-CM | POA: Diagnosis not present

## 2023-09-28 DIAGNOSIS — M25551 Pain in right hip: Secondary | ICD-10-CM | POA: Diagnosis not present

## 2023-09-28 DIAGNOSIS — R2689 Other abnormalities of gait and mobility: Secondary | ICD-10-CM | POA: Diagnosis not present

## 2023-09-28 DIAGNOSIS — S32461A Displaced associated transverse-posterior fracture of right acetabulum, initial encounter for closed fracture: Secondary | ICD-10-CM | POA: Diagnosis not present

## 2023-09-28 DIAGNOSIS — Z96641 Presence of right artificial hip joint: Secondary | ICD-10-CM | POA: Diagnosis not present

## 2023-09-28 DIAGNOSIS — M1651 Unilateral post-traumatic osteoarthritis, right hip: Secondary | ICD-10-CM | POA: Diagnosis not present

## 2023-09-29 DIAGNOSIS — E782 Mixed hyperlipidemia: Secondary | ICD-10-CM | POA: Diagnosis not present

## 2023-09-29 DIAGNOSIS — Z6830 Body mass index (BMI) 30.0-30.9, adult: Secondary | ICD-10-CM | POA: Diagnosis not present

## 2023-09-29 DIAGNOSIS — R7309 Other abnormal glucose: Secondary | ICD-10-CM | POA: Diagnosis not present

## 2023-09-29 DIAGNOSIS — I38 Endocarditis, valve unspecified: Secondary | ICD-10-CM | POA: Diagnosis not present

## 2023-09-29 DIAGNOSIS — I2510 Atherosclerotic heart disease of native coronary artery without angina pectoris: Secondary | ICD-10-CM | POA: Diagnosis not present

## 2023-09-29 DIAGNOSIS — E039 Hypothyroidism, unspecified: Secondary | ICD-10-CM | POA: Diagnosis not present

## 2023-10-06 DIAGNOSIS — E118 Type 2 diabetes mellitus with unspecified complications: Secondary | ICD-10-CM | POA: Diagnosis not present

## 2023-10-06 DIAGNOSIS — I10 Essential (primary) hypertension: Secondary | ICD-10-CM | POA: Diagnosis not present

## 2023-10-06 DIAGNOSIS — I480 Paroxysmal atrial fibrillation: Secondary | ICD-10-CM | POA: Diagnosis not present

## 2023-10-06 DIAGNOSIS — R7309 Other abnormal glucose: Secondary | ICD-10-CM | POA: Diagnosis not present

## 2023-10-12 DIAGNOSIS — M1651 Unilateral post-traumatic osteoarthritis, right hip: Secondary | ICD-10-CM | POA: Diagnosis not present

## 2023-10-12 DIAGNOSIS — M25551 Pain in right hip: Secondary | ICD-10-CM | POA: Diagnosis not present

## 2023-10-12 DIAGNOSIS — S32461A Displaced associated transverse-posterior fracture of right acetabulum, initial encounter for closed fracture: Secondary | ICD-10-CM | POA: Diagnosis not present

## 2023-10-12 DIAGNOSIS — R2689 Other abnormalities of gait and mobility: Secondary | ICD-10-CM | POA: Diagnosis not present

## 2023-10-12 DIAGNOSIS — Z96641 Presence of right artificial hip joint: Secondary | ICD-10-CM | POA: Diagnosis not present

## 2023-10-19 DIAGNOSIS — M1651 Unilateral post-traumatic osteoarthritis, right hip: Secondary | ICD-10-CM | POA: Diagnosis not present

## 2023-10-19 DIAGNOSIS — S32461A Displaced associated transverse-posterior fracture of right acetabulum, initial encounter for closed fracture: Secondary | ICD-10-CM | POA: Diagnosis not present

## 2023-10-19 DIAGNOSIS — M25551 Pain in right hip: Secondary | ICD-10-CM | POA: Diagnosis not present

## 2023-10-19 DIAGNOSIS — Z96641 Presence of right artificial hip joint: Secondary | ICD-10-CM | POA: Diagnosis not present

## 2023-10-19 DIAGNOSIS — R2689 Other abnormalities of gait and mobility: Secondary | ICD-10-CM | POA: Diagnosis not present

## 2023-10-24 DIAGNOSIS — S32461A Displaced associated transverse-posterior fracture of right acetabulum, initial encounter for closed fracture: Secondary | ICD-10-CM | POA: Diagnosis not present

## 2023-10-24 DIAGNOSIS — M47816 Spondylosis without myelopathy or radiculopathy, lumbar region: Secondary | ICD-10-CM | POA: Diagnosis not present

## 2023-10-24 DIAGNOSIS — Z96641 Presence of right artificial hip joint: Secondary | ICD-10-CM | POA: Diagnosis not present

## 2023-10-24 DIAGNOSIS — M85851 Other specified disorders of bone density and structure, right thigh: Secondary | ICD-10-CM | POA: Diagnosis not present

## 2023-10-24 DIAGNOSIS — E118 Type 2 diabetes mellitus with unspecified complications: Secondary | ICD-10-CM | POA: Diagnosis not present

## 2023-10-24 DIAGNOSIS — M1612 Unilateral primary osteoarthritis, left hip: Secondary | ICD-10-CM | POA: Diagnosis not present

## 2023-11-07 DIAGNOSIS — E782 Mixed hyperlipidemia: Secondary | ICD-10-CM | POA: Diagnosis not present

## 2023-11-07 DIAGNOSIS — I10 Essential (primary) hypertension: Secondary | ICD-10-CM | POA: Diagnosis not present

## 2023-11-07 DIAGNOSIS — I252 Old myocardial infarction: Secondary | ICD-10-CM | POA: Diagnosis not present

## 2023-11-07 DIAGNOSIS — I2510 Atherosclerotic heart disease of native coronary artery without angina pectoris: Secondary | ICD-10-CM | POA: Diagnosis not present

## 2023-11-07 DIAGNOSIS — I480 Paroxysmal atrial fibrillation: Secondary | ICD-10-CM | POA: Diagnosis not present

## 2023-11-07 DIAGNOSIS — R0602 Shortness of breath: Secondary | ICD-10-CM | POA: Diagnosis not present

## 2023-11-07 DIAGNOSIS — I119 Hypertensive heart disease without heart failure: Secondary | ICD-10-CM | POA: Diagnosis not present

## 2023-11-07 DIAGNOSIS — I38 Endocarditis, valve unspecified: Secondary | ICD-10-CM | POA: Diagnosis not present

## 2023-11-08 DIAGNOSIS — M1651 Unilateral post-traumatic osteoarthritis, right hip: Secondary | ICD-10-CM | POA: Diagnosis not present

## 2023-11-08 DIAGNOSIS — R2689 Other abnormalities of gait and mobility: Secondary | ICD-10-CM | POA: Diagnosis not present

## 2023-11-08 DIAGNOSIS — M25551 Pain in right hip: Secondary | ICD-10-CM | POA: Diagnosis not present

## 2023-11-08 DIAGNOSIS — Z96641 Presence of right artificial hip joint: Secondary | ICD-10-CM | POA: Diagnosis not present

## 2023-11-08 DIAGNOSIS — S32461A Displaced associated transverse-posterior fracture of right acetabulum, initial encounter for closed fracture: Secondary | ICD-10-CM | POA: Diagnosis not present

## 2023-11-17 DIAGNOSIS — R0602 Shortness of breath: Secondary | ICD-10-CM | POA: Diagnosis not present

## 2023-11-17 DIAGNOSIS — I252 Old myocardial infarction: Secondary | ICD-10-CM | POA: Diagnosis not present

## 2023-11-28 DIAGNOSIS — Z96641 Presence of right artificial hip joint: Secondary | ICD-10-CM | POA: Diagnosis not present

## 2023-11-28 DIAGNOSIS — M1651 Unilateral post-traumatic osteoarthritis, right hip: Secondary | ICD-10-CM | POA: Diagnosis not present

## 2023-11-28 DIAGNOSIS — R2689 Other abnormalities of gait and mobility: Secondary | ICD-10-CM | POA: Diagnosis not present

## 2023-11-28 DIAGNOSIS — M25551 Pain in right hip: Secondary | ICD-10-CM | POA: Diagnosis not present

## 2023-11-28 DIAGNOSIS — S32461A Displaced associated transverse-posterior fracture of right acetabulum, initial encounter for closed fracture: Secondary | ICD-10-CM | POA: Diagnosis not present

## 2023-11-30 DIAGNOSIS — M25551 Pain in right hip: Secondary | ICD-10-CM | POA: Diagnosis not present

## 2023-11-30 DIAGNOSIS — R2689 Other abnormalities of gait and mobility: Secondary | ICD-10-CM | POA: Diagnosis not present

## 2023-11-30 DIAGNOSIS — S32461A Displaced associated transverse-posterior fracture of right acetabulum, initial encounter for closed fracture: Secondary | ICD-10-CM | POA: Diagnosis not present

## 2023-11-30 DIAGNOSIS — M1651 Unilateral post-traumatic osteoarthritis, right hip: Secondary | ICD-10-CM | POA: Diagnosis not present

## 2023-11-30 DIAGNOSIS — Z96641 Presence of right artificial hip joint: Secondary | ICD-10-CM | POA: Diagnosis not present

## 2023-12-23 ENCOUNTER — Other Ambulatory Visit: Payer: Medicare Other

## 2023-12-23 DIAGNOSIS — C61 Malignant neoplasm of prostate: Secondary | ICD-10-CM

## 2023-12-24 LAB — PSA: Prostate Specific Ag, Serum: 2.5 ng/mL (ref 0.0–4.0)

## 2023-12-29 ENCOUNTER — Ambulatory Visit: Payer: Medicare Other | Admitting: Urology

## 2023-12-29 VITALS — BP 184/95 | HR 80 | Ht 74.0 in | Wt 230.0 lb

## 2023-12-29 DIAGNOSIS — N138 Other obstructive and reflux uropathy: Secondary | ICD-10-CM

## 2023-12-29 DIAGNOSIS — Z87898 Personal history of other specified conditions: Secondary | ICD-10-CM

## 2023-12-29 DIAGNOSIS — Z125 Encounter for screening for malignant neoplasm of prostate: Secondary | ICD-10-CM

## 2023-12-29 DIAGNOSIS — N401 Enlarged prostate with lower urinary tract symptoms: Secondary | ICD-10-CM

## 2023-12-29 MED ORDER — DUTASTERIDE 0.5 MG PO CAPS: 0.5000 mg | ORAL_CAPSULE | Freq: Every day | ORAL | 3 refills | Status: DC

## 2023-12-29 NOTE — Progress Notes (Signed)
   12/29/2023 11:28 AM   Keith Williamson 1946/07/13 [id number]  Reason for visit: Follow up BPH/LUTS, history of elevated PSA  HPI: 78 year old male who I originally met in January 2020 for to establish care after he previously was followed by Dr. Sheppard Penton.  He is here again with his wife today who helps provide much of the history as he is very hard of hearing.  He has a history of elevated PSA as high as 14 which prompted a prostate MRI in December 2021, showing a 120 g gland with no worrisome lesions.  He underwent a greenlight laser PVP with Dr. Daleen Squibb for obstructive symptoms in January 2022, and he has been urinating well since that time.  He remains on dutasteride, and PSA has decreased appropriately on that medication.  Most recent PSA 2.5 from January 2025 which is stable from 2.6 prior.  Corrected for finasteride equals 5, normal for age and with known history of BPH with history of benign MRI.  They are fairly adamant about continuing yearly PSA screening despite his age.  He denies any significant urinary symptoms or changes since our last visit.  He has some worsening frequency during the day, but consumes a fair amount of tea, soda, and coffee during the day.  We focused on behavioral strategies today.  Urinalysis in October 2024 was benign.  Continue finasteride, refilled Family strongly desires yearly PSA monitoring, reassurance provided RTC 1 year PVR, PSA prior  Sondra Come, MD  [facility] [address] [place], [place] [address] [phone]

## 2023-12-29 NOTE — Patient Instructions (Signed)
Decrease intake of tea, soda, diet drinks, as these can all worsen your urinary urgency.  I recommend drinking mostly water for a week, and adding these drinks back in 1 at a time to see which specific beverage bothers her bladder the most

## 2024-08-01 ENCOUNTER — Encounter: Payer: Self-pay | Admitting: Urology

## 2024-09-03 NOTE — Progress Notes (Signed)
 Keith Williamson is a 78 y.o. male seen in follow up for post-surgical hypothyroidism and history of thyroid  cancer. He was last seen on 02/27/2024. He is accompanied today by his wife. 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  12/29/06  Treatment with 173 mCi of I-131 at [facility] 01/05/07 Post-treatment WBS showed neck uptake 08/07/07 Neck US : 0.9 cm focus of tissue in neck 04/23/08 WBS neg 01/14/09  Thyrogen-stimulated WBS neg 04/23/11 Thyrogen-stimulated WBS neg 05/01/11 Thyrogen stimulated Tgb <0.01 (with TSH level 202) 10/2017 Thyrogen stimulated Tgb <0.01, anti-Tgb antibodies <1 11/14/2019  TSH 1.566 on levothyroxine  137 mcg daily 02/04/21 TSH 0.475, Tgb <0.1, anti-Tgb antibodies <1 on levothyroxine  137 mcg daily 08/14/21  TSH 0.219 10/26/21 TSH 7.809 11/13/21 TSH 0.676 01/25/22 TSH 0.062, Tgb <0.1, anti-Tgb antibodies <1 on levothyroxine  137 mcg daily 07/15/22  TSH 0.318 on levothyroxine  125 mcg six days per week and 62.5 mcg (one-half tab) one day per week  Since 07/2022, his TSH has remained in normal range on levothyroxine  125 mcg six days per week and 62.5 mcg (one-half tab) one day per week. He has no acute complaints. He denies heat intolerance, heart racing, heart palpitations, tremor, or unintentional weight loss. Denies neck discomfort. No dysphagia.  Past Medical History:  Diagnosis Date   Arthritis    BPH (benign prostatic hypertrophy)    COVID-19 08/14/2021   Diverticulosis 05/07/2016   Enlarged prostate    GERD (gastroesophageal reflux disease)    Hernia    Hx of myocardial infarction    Hx of thyroid  cancer 09/2006   Hyperlipidemia    Hypertension    Post-surgical hypothyroidism    Reflux esophagitis 05/07/2016   Retina hole 09/19/2013   Senile nuclear sclerosis 09/19/2013   Tubular adenoma of colon 05/07/2016   Valvular heart disease      Outpatient Medications Marked as Taking for the 09/03/24 encounter (Office Visit) with Solum, Anna Melissa, MD  Medication Sig Dispense Refill   allopurinoL  (ZYLOPRIM ) 100 MG tablet TAKE 1 TABLET BY MOUTH EVERY DAY 90 tablet 1   candesartan (ATACAND) 32 MG tablet TAKE 1 TABLET (32 MG TOTAL) BY MOUTH ONCE DAILY FOR 180 DAYS 90 tablet 1   clobetasoL (CORMAX) 0.05 % external solution Apply 1 Application topically once daily as needed     colchicine  (COLCRYS ) 0.6 mg tablet Take 1 tablet (0.6 mg total) by mouth as needed (for gout flare up)     docusate (COLACE) 100 MG capsule Take 100 mg by mouth as needed for Constipation     dutasteride  (AVODART ) 0.5 mg capsule Take 1 capsule (0.5 mg total) by mouth once daily     ELIQUIS  5 mg tablet TAKE 1 TABLET (5 MG TOTAL) BY MOUTH EVERY 12 (TWELVE) HOURS FOR 180 DAYS 180 tablet 1   ketoconazole (NIZORAL) 2 % shampoo Apply 1 Application topically once daily as needed     levothyroxine  (SYNTHROID ) 125 MCG tablet Take one tablet 6 days per week. Take one-half tablet one day per week, 88 tablet 3   meclizine  (ANTIVERT ) 12.5 mg tablet Take 12.5 mg by mouth 2 (two) times daily as needed for Dizziness     multivitamin tablet Take 1 tablet by mouth once daily     nitroGLYcerin  (NITROSTAT ) 0.4 MG SL tablet Place 1 tablet (0.4 mg total) under the tongue every 5 (five) minutes as needed for Chest pain May take up to 3  doses. 25 tablet 3   rosuvastatin  (CRESTOR ) 40 MG tablet TAKE 1 TABLET BY MOUTH EVERY DAY 90 tablet 3   tiZANidine (ZANAFLEX) 4 MG tablet Take 1 tablet (4 mg total) by mouth 3 (three) times daily as needed for up to 20 doses 20 tablet 0   traMADoL  (ULTRAM ) 50 mg tablet Take 1 tablet (50 mg total) by mouth every 8 (eight) hours as needed for Pain (Caution.  May cause drowsiness.  Reserve for bedtime.) for up to 20 doses 20 tablet 0    Physical Exam BP (!) 140/80   Pulse 75   Ht 188 cm (6' 2)   Wt (!) 111.1 kg (245 lb)   SpO2 98%    BMI 31.46 kg/m   GEN: well developed, well nourished male, in NAD. HEENT: No proptosis, EOMI, lid lag or stare.   NECK: supple, trachea midline. No neck TTP. No neck mass.  LYMPH: No anterior/posterior, supraclavicular or submandibular LAD.  NEURO: PERRL +hard of hearing. PSYC: alert and oriented, fair insight  Labs  Appointment on 08/27/2024  Component Date Value Ref Range Status   Thyroid  Stimulating Hormone (TSH) 08/27/2024 2.454  0.450-5.330 uIU/ml uIU/mL Final    Assessment Hypothyroidism, post-surgical History of thyroid  cancer  Plan * He continues to do well with no findings of recurrence of thyroid  cancer. It is reasonable to aim to keep his TSH level is in the lower half of the reference range or 0.45 - 2.6 uIU/ml. Hypothyroidism is adequately treated at this time. Continue levothyroxine  as prescribed. * No need for neck imaging at this time.  * He will return for follow up in 6 mths or sooner, if needed.

## 2024-10-15 NOTE — Progress Notes (Signed)
 Established Patient Visit   Chief Complaint: Coronary artery disease Date of Service: 10/15/2024 Date of Birth: Apr 30, 1946 PCP: Sadie Tamra Cal, MD  History of Present Illness: Keith Williamson is a 78 y.o.male patient who presented for coronary artery disease  Past medical history significant for coronary artery disease with inferior MI status post RCA PCI 2014, paroxysmal atrial fibrillation-1 episode in 2022, hypertension, hyperlipidemia, valvular heart disease.  Echocardiogram 11/2023 with normal biventricular systolic function, LVEF 55%, moderate LVH, moderate pulmonary regurgitation.  Today patient details that he is overall doing fine.  Does usual household activities with no complaints of chest pain/pressure, palpitation, dizziness or syncope.  Past Medical and Surgical History  Past Medical History Past Medical History:  Diagnosis Date   Arthritis    BPH (benign prostatic hypertrophy)    COVID-19 08/14/2021   Diverticulosis 05/07/2016   Enlarged prostate    GERD (gastroesophageal reflux disease)    Hernia    Hx of myocardial infarction    Hx of thyroid  cancer 09/2006   Hyperlipidemia    Hypertension    Post-surgical hypothyroidism    Reflux esophagitis 05/07/2016   Retina hole 09/19/2013   Senile nuclear sclerosis 09/19/2013   Tubular adenoma of colon 05/07/2016   Valvular heart disease     Past Surgical History He has a past surgical history that includes Angioplasty / stenting femoral; Thyroidectomy Total; TURP vaporization; L3-L4 microdiscectomy (Left, 07/14/2010); Knee arthroscopy (Left, 03/02/2011); Colonoscopy (05/07/2016); egd (05/07/2016); Back Surgery ; prostate hyperplaysia (08/31/2017); open reduction posterior/anterior acetabular fracture w/fixation (Right, 04/10/2021); Spine surgery; conversion previous hip surgery to total hip arthroplasty (Right, 10/15/2021); treatment prophylactic femur w/possible cement (Right, 10/15/2021); and unlisted  procedure pelvis/hip joint (Right, 10/15/2021).   Medications and Allergies  Current Medications  Current Outpatient Medications  Medication Sig Dispense Refill   allopurinoL  (ZYLOPRIM ) 100 MG tablet TAKE 1 TABLET BY MOUTH EVERY DAY 90 tablet 1   candesartan (ATACAND) 32 MG tablet TAKE 1 TABLET (32 MG TOTAL) BY MOUTH ONCE DAILY FOR 180 DAYS 90 tablet 1   colchicine  (COLCRYS ) 0.6 mg tablet Take 1 tablet (0.6 mg total) by mouth as needed (for gout flare up)     docusate (COLACE) 100 MG capsule Take 100 mg by mouth as needed for Constipation     dutasteride  (AVODART ) 0.5 mg capsule Take 1 capsule (0.5 mg total) by mouth once daily     ELIQUIS  5 mg tablet TAKE 1 TABLET (5 MG TOTAL) BY MOUTH EVERY 12 (TWELVE) HOURS FOR 180 DAYS 180 tablet 1   levothyroxine  (SYNTHROID ) 125 MCG tablet Take one tablet 6 days per week. Take one-half tablet one day per week, 88 tablet 3   meclizine  (ANTIVERT ) 12.5 mg tablet Take 12.5 mg by mouth 2 (two) times daily as needed for Dizziness     multivitamin tablet Take 1 tablet by mouth once daily     nitroGLYcerin  (NITROSTAT ) 0.4 MG SL tablet Place 1 tablet (0.4 mg total) under the tongue every 5 (five) minutes as needed for Chest pain May take up to 3 doses. 25 tablet 3   rosuvastatin  (CRESTOR ) 40 MG tablet TAKE 1 TABLET BY MOUTH EVERY DAY 90 tablet 3   clobetasoL (CORMAX) 0.05 % external solution Apply 1 Application topically once daily as needed (Patient not taking: Reported on 10/15/2024)     ketoconazole (NIZORAL) 2 % shampoo Apply 1 Application topically once daily as needed (Patient not taking: Reported on 10/15/2024)     tiZANidine (ZANAFLEX) 4 MG tablet Take 1 tablet (  4 mg total) by mouth 3 (three) times daily as needed for up to 20 doses (Patient not taking: Reported on 10/15/2024) 20 tablet 0   traMADoL  (ULTRAM ) 50 mg tablet Take 1 tablet (50 mg total) by mouth every 8 (eight) hours as needed for Pain (Caution.  May cause drowsiness.  Reserve for  bedtime.) for up to 20 doses (Patient not taking: Reported on 10/15/2024) 20 tablet 0   No current facility-administered medications for this visit.    Allergies: Ezetimibe, Flu vaccine 2011-12(3 yr+)(pf), Hctz/reserpine/hydralazine [hydralazine-reserpin-hcthiazid], Hydralazine hcl, Levsin [hyoscyamine sulfate], Norvasc [amlodipine], and Other  Social and Family History  Social History  reports that he quit smoking about 61 years ago. His smoking use included cigarettes. He has been exposed to tobacco smoke. He has never used smokeless tobacco. He reports that he does not drink alcohol  and does not use drugs.  Family History Family History  Problem Relation Name Age of Onset   Coronary Artery Disease (Blocked arteries around heart) Mother     High blood pressure (Hypertension) Mother     Myocardial Infarction (Heart attack) Mother     Prostate cancer Father     Cancer Father     Thyroid  cancer Sister     Leukemia Sister     Cancer Sister     High blood pressure (Hypertension) Sister     No Known Problems Sister     Colon cancer Sister     Colon polyps Sister     Thyroid  disease Brother     Cancer Brother     No Known Problems Brother     No Known Problems Brother     Macular degeneration Neg Hx     Cataracts Neg Hx     Glaucoma Neg Hx     Anesthesia problems Neg Hx      Review of Systems   Review of Systems: The patient denies chest pain, shortness of breath, orthopnea, paroxysmal nocturnal dyspnea, pedal edema, palpitations, heart racing, presyncope, syncope.   Physical Examination   Vitals:BP 136/80 (BP Location: Left upper arm, Patient Position: Sitting, BP Cuff Size: Large Adult)   Pulse 87   Ht 188 cm (6' 2)   Wt (!) 108.7 kg (239 lb 9.6 oz)   SpO2 98%   BMI 30.76 kg/m  Ht:188 cm (6' 2) Wt:(!) 108.7 kg (239 lb 9.6 oz) ADJ:Anib surface area is 2.38 meters squared. Body mass index is 30.76 kg/m.  HEENT: Pupils equally reactive to light and  accomodation  Neck: Supple, no significant JVD Lungs: clear to auscultation bilaterally; no wheezes, rales, rhonchi Heart: Regular rate and rhythm. No murmur Extremities: no pedal edema  Assessment and Plan   78 y.o. male with  CAD status post PCI RCA 2014 Paroxysmal atrial fibrillation Hypertension Hyperlipidemia Moderate pulmonary regurgitation  Stable from cardiac standpoint without any anginal symptoms.  Euvolemic on exam. Blood pressure well-controlled, continue candesartan. LDL at goal, continue rosuvastatin . Regular rhythm on exam today. Continue Eliquis  for anticoagulation. Healthy diet and regular activity  No orders of the defined types were placed in this encounter.   Return in about 6 months (around 04/14/2025).  Keith CHAITANYA ALLURI, MD  This dictation was prepared with dragon dictation. Any transcription errors that result from this process are unintentional.

## 2024-10-23 NOTE — Progress Notes (Signed)
 Sent refill for Rosuvastatin  per request of [facility] via fax

## 2024-12-10 ENCOUNTER — Other Ambulatory Visit: Payer: Self-pay

## 2024-12-10 ENCOUNTER — Emergency Department
Admission: EM | Admit: 2024-12-10 | Discharge: 2024-12-10 | Disposition: A | Discharge status: Home / Self Care | Attending: Emergency Medicine | Admitting: Emergency Medicine

## 2024-12-10 ENCOUNTER — Emergency Department

## 2024-12-10 DIAGNOSIS — J101 Influenza due to other identified influenza virus with other respiratory manifestations: Secondary | ICD-10-CM | POA: Diagnosis not present

## 2024-12-10 DIAGNOSIS — I10 Essential (primary) hypertension: Secondary | ICD-10-CM | POA: Diagnosis not present

## 2024-12-10 DIAGNOSIS — R42 Dizziness and giddiness: Secondary | ICD-10-CM | POA: Insufficient documentation

## 2024-12-10 DIAGNOSIS — Z8673 Personal history of transient ischemic attack (TIA), and cerebral infarction without residual deficits: Secondary | ICD-10-CM | POA: Insufficient documentation

## 2024-12-10 DIAGNOSIS — I4510 Unspecified right bundle-branch block: Secondary | ICD-10-CM | POA: Insufficient documentation

## 2024-12-10 DIAGNOSIS — R059 Cough, unspecified: Secondary | ICD-10-CM | POA: Diagnosis present

## 2024-12-10 DIAGNOSIS — I2510 Atherosclerotic heart disease of native coronary artery without angina pectoris: Secondary | ICD-10-CM | POA: Insufficient documentation

## 2024-12-10 DIAGNOSIS — R9082 White matter disease, unspecified: Secondary | ICD-10-CM | POA: Insufficient documentation

## 2024-12-10 DIAGNOSIS — I440 Atrioventricular block, first degree: Secondary | ICD-10-CM | POA: Insufficient documentation

## 2024-12-10 DIAGNOSIS — I482 Chronic atrial fibrillation, unspecified: Secondary | ICD-10-CM | POA: Insufficient documentation

## 2024-12-10 LAB — COMPREHENSIVE METABOLIC PANEL WITH GFR
ALT: 24 U/L (ref 0–44)
AST: 33 U/L (ref 15–41)
Albumin: 4.3 g/dL (ref 3.5–5.0)
Alkaline Phosphatase: 78 U/L (ref 38–126)
Anion gap: 12 (ref 5–15)
BUN: 24 mg/dL — ABNORMAL HIGH (ref 8–23)
CO2: 25 mmol/L (ref 22–32)
Calcium: 9.1 mg/dL (ref 8.9–10.3)
Chloride: 103 mmol/L (ref 98–111)
Creatinine, Ser: 1.48 mg/dL — ABNORMAL HIGH (ref 0.61–1.24)
GFR, Estimated: 48 mL/min — ABNORMAL LOW
Glucose, Bld: 96 mg/dL (ref 70–99)
Potassium: 3.9 mmol/L (ref 3.5–5.1)
Sodium: 140 mmol/L (ref 135–145)
Total Bilirubin: 0.9 mg/dL (ref 0.0–1.2)
Total Protein: 7.3 g/dL (ref 6.5–8.1)

## 2024-12-10 LAB — APTT: aPTT: 41 s — ABNORMAL HIGH (ref 24–36)

## 2024-12-10 LAB — RESP PANEL BY RT-PCR (RSV, FLU A&B, COVID)  RVPGX2
Influenza A by PCR: POSITIVE — AB
Influenza B by PCR: NEGATIVE
Resp Syncytial Virus by PCR: NEGATIVE
SARS Coronavirus 2 by RT PCR: NEGATIVE

## 2024-12-10 LAB — DIFFERENTIAL
Abs Immature Granulocytes: 0.02 K/uL (ref 0.00–0.07)
Basophils Absolute: 0.1 K/uL (ref 0.0–0.1)
Basophils Relative: 1 %
Eosinophils Absolute: 0.1 K/uL (ref 0.0–0.5)
Eosinophils Relative: 1 %
Immature Granulocytes: 0 %
Lymphocytes Relative: 12 %
Lymphs Abs: 0.8 K/uL (ref 0.7–4.0)
Monocytes Absolute: 1.1 K/uL — ABNORMAL HIGH (ref 0.1–1.0)
Monocytes Relative: 16 %
Neutro Abs: 4.7 K/uL (ref 1.7–7.7)
Neutrophils Relative %: 70 %

## 2024-12-10 LAB — CBC
HCT: 45.7 % (ref 39.0–52.0)
Hemoglobin: 14.8 g/dL (ref 13.0–17.0)
MCH: 29 pg (ref 26.0–34.0)
MCHC: 32.4 g/dL (ref 30.0–36.0)
MCV: 89.6 fL (ref 80.0–100.0)
Platelets: 184 K/uL (ref 150–400)
RBC: 5.1 MIL/uL (ref 4.22–5.81)
RDW: 14 % (ref 11.5–15.5)
WBC: 6.7 K/uL (ref 4.0–10.5)
nRBC: 0 % (ref 0.0–0.2)

## 2024-12-10 LAB — TROPONIN T, HIGH SENSITIVITY
Troponin T High Sensitivity: 50 ng/L — ABNORMAL HIGH (ref 0–19)
Troponin T High Sensitivity: 46 ng/L — ABNORMAL HIGH (ref 0–19)

## 2024-12-10 LAB — PROTIME-INR
INR: 1.5 — ABNORMAL HIGH (ref 0.8–1.2)
Prothrombin Time: 18.4 s — ABNORMAL HIGH (ref 11.4–15.2)

## 2024-12-10 LAB — ETHANOL: Alcohol, Ethyl (B): <15 mg/dL

## 2024-12-10 MED ORDER — OSELTAMIVIR PHOSPHATE 75 MG PO CAPS
75.0000 mg | ORAL_CAPSULE | Freq: Once | ORAL | Status: AC
  Administered 2024-12-10: 75 mg via ORAL
  Filled 2024-12-10: qty 1

## 2024-12-10 MED ORDER — OSELTAMIVIR PHOSPHATE 75 MG PO CAPS: 75.0000 mg | ORAL_CAPSULE | Freq: Two times a day (BID) | ORAL | 0 refills | Status: AC

## 2024-12-10 NOTE — Discharge Instructions (Addendum)
 Take the Tamiflu  as prescribed.  Follow-up with your primary care doctor.  Return to the ER for new, worsening, or persistent severe weakness, dizziness, shortness of breath, vomiting, inability to hold anything down, or any other new or worsening symptoms that are concerning.

## 2024-12-10 NOTE — ED Triage Notes (Signed)
 Pt comes with HTN ,dizziness and flu like symptoms that started on Saturday. Pt states blurry vision also and still having it.

## 2024-12-10 NOTE — Progress Notes (Signed)
 78 year old male presents to triage complaining of elevated blood pressure, headaches, dizziness, visual disturbance.  To ED for further evaluation and care.

## 2024-12-10 NOTE — ED Notes (Signed)
 Second call placed to lab regarding troponin.

## 2024-12-10 NOTE — ED Provider Notes (Signed)
 "  [facility] Provider Note    Event Date/Time   First MD Initiated Contact with Patient 12/10/24 1555     (approximate)   History   Hypertension and Dizziness   HPI  Keith Williamson is a 78 y.o. male with history of CAD, paroxysmal atrial fibrillation, hypertension, hyperlipidemia, and BPH who presents with multiple complaints over the last several days.  Primarily he has had cold-like symptoms with nasal congestion, rhinorrhea and cough over the last 2 days.  In addition he reports dizziness, described as vertigo, associated with bilateral blurred vision, occurring intermittently over the last week or more.  He states that this has been going on for a while.  His wife first noted him mentioning it 3 days ago and she given one of her meclizine  at that time which seemed to help.  The patient denies any double vision.  He has no eye pain.  He does report frontal headaches.  He denies any chest pain or difficulty breathing.  He has no vomiting or diarrhea.  I reviewed the past medical records.  The patient's most recent outpatient encounter was on 11/3 with cardiology for follow-up of his CAD.   Physical Exam   Triage Vital Signs: ED Triage Vitals  Encounter Vitals Group     BP 12/10/24 1339 (!) 127/98     Girls Systolic BP Percentile --      Girls Diastolic BP Percentile --      Boys Systolic BP Percentile --      Boys Diastolic BP Percentile --      Pulse Rate 12/10/24 1339 (!) 105     Resp 12/10/24 1339 18     Temp 12/10/24 1339 98 F (36.7 C)     Temp src --      SpO2 12/10/24 1339 96 %     Weight 12/10/24 1335 239 lb (108.4 kg)     Height 12/10/24 1335 6' 2 (1.88 m)     Head Circumference --      Peak Flow --      Pain Score 12/10/24 1334 7     Pain Loc --      Pain Education --      Exclude from Growth Chart --     Most recent vital signs: Vitals:   12/10/24 1339 12/10/24 1749  BP: (!) 127/98 (!) 153/96  Pulse: (!) 105 92  Resp: 18 17   Temp: 98 F (36.7 C)   SpO2: 96% 96%     General: Alert, relatively well-appearing, no distress.  CV:  Good peripheral perfusion.  Resp:  Normal effort.  Abd:  No distention.  Other:  EOMI.  PERRLA.  No photophobia.  No nystagmus.  No facial droop.  Motor intact in all extremities.  No ataxia.   ED Results / Procedures / Treatments   Labs (all labs ordered are listed, but only abnormal results are displayed) Labs Reviewed  RESP PANEL BY RT-PCR (RSV, FLU A&B, COVID)  RVPGX2 - Abnormal; Notable for the following components:      Result Value   Influenza A by PCR POSITIVE (*)    All other components within normal limits  PROTIME-INR - Abnormal; Notable for the following components:   Prothrombin Time 18.4 (*)    INR 1.5 (*)    All other components within normal limits  APTT - Abnormal; Notable for the following components:   aPTT 41 (*)    All other components within normal limits  DIFFERENTIAL - Abnormal; Notable for the following components:   Monocytes Absolute 1.1 (*)    All other components within normal limits  COMPREHENSIVE METABOLIC PANEL WITH GFR - Abnormal; Notable for the following components:   BUN 24 (*)    Creatinine, Ser 1.48 (*)    GFR, Estimated 48 (*)    All other components within normal limits  TROPONIN T, HIGH SENSITIVITY - Abnormal; Notable for the following components:   Troponin T High Sensitivity 50 (*)    All other components within normal limits  TROPONIN T, HIGH SENSITIVITY - Abnormal; Notable for the following components:   Troponin T High Sensitivity 46 (*)    All other components within normal limits  CBC  ETHANOL  CBG MONITORING, ED     EKG  ED ECG REPORT I, Waylon Cassis, the attending physician, personally viewed and interpreted this ECG.  Date: 12/10/2024 EKG Time: 1339 Rate: 100 Rhythm: normal sinus rhythm QRS Axis: normal Intervals: RBBB ST/T Wave abnormalities: normal Narrative Interpretation: no evidence of acute  ischemia    RADIOLOGY  CT head: I independently viewed and interpreted the images; there is no ICH.  Radiology report indicates no acute intracranial findings.  PROCEDURES:  Critical Care performed: No  Procedures   MEDICATIONS ORDERED IN ED: Medications  oseltamivir  (TAMIFLU ) capsule 75 mg (has no administration in time range)     IMPRESSION / MDM / ASSESSMENT AND PLAN / ED COURSE  I reviewed the triage vital signs and the nursing notes.  78 year old male with PMH as noted above presents with multiple complaints, but primarily URI-like symptoms over the last 2 days as well as dizziness, headache, and intermittent blurred vision occurring for up to a week or more although the patient is somewhat vague about the time course.  On exam patient is overall well-appearing.  He was borderline tachycardic at triage.  Other vital signs are normal.  Thorough neurologic exam is nonfocal.  Differential diagnosis includes, but is not limited to, viral URI, influenza, COVID, other viral syndrome, peripheral vertigo, tension headache, dehydration, electrolyte abnormality, other metabolic cause.  There is no evidence of cardiac etiology.  Given the nonfocal neurologic exam and the intermittent nature of the blurred vision, there is no evidence of acute CVA.  Lab workup so far is reassuring.  CMP shows mild creatinine elevation.  CBC is normal.  CT head shows no acute findings.  EKG is nonischemic.  I have added on a troponin as well as a respiratory panel to rule out flu.  Patient's presentation is most consistent with acute complicated illness / injury requiring diagnostic workup.  The patient is on the cardiac monitor to evaluate for evidence of arrhythmia and/or significant heart rate changes.  ----------------------------------------- 8:09 PM on 12/10/2024 -----------------------------------------  Initial troponin was minimally elevated at 50, and the repeat was 46.  The patient is not  having chest pain or ischemic symptoms and his EKG is reassuring.  Respiratory panel is positive for influenza A.  I think that this also explains the increased troponins.  On reassessment, the patient is comfortable appearing.  I did consider whether he may benefit from inpatient admission given the slightly increased troponin, but since it is trending downwards and he is not having any concerning symptoms, as well as with an otherwise reassuring workup and stable clinical status (not requiring oxygen) he is appropriate for discharge home.  The patient himself is eager to go home.  I have prescribed Tamiflu .  The patient's creatinine clearance  is 59 so he will get the full dose.  I counseled the patient and wife on the results of the workup and answered all of their questions.  I gave strict return precautions and they expressed understanding.   FINAL CLINICAL IMPRESSION(S) / ED DIAGNOSES   Final diagnoses:  Influenza A     Rx / DC Orders   ED Discharge Orders          Ordered    oseltamivir  (TAMIFLU ) 75 MG capsule  2 times daily        12/10/24 2008             Note:  This document was prepared using Dragon voice recognition software and may include unintentional dictation errors.    Jacolyn Pae, MD 12/10/24 2011  "

## 2024-12-21 ENCOUNTER — Other Ambulatory Visit: Payer: Self-pay

## 2024-12-21 DIAGNOSIS — N138 Other obstructive and reflux uropathy: Secondary | ICD-10-CM

## 2024-12-21 DIAGNOSIS — Z125 Encounter for screening for malignant neoplasm of prostate: Secondary | ICD-10-CM

## 2024-12-22 LAB — PSA: Prostate Specific Ag, Serum: 2.7 ng/mL (ref 0.0–4.0)

## 2025-01-02 ENCOUNTER — Ambulatory Visit: Payer: Self-pay | Admitting: Urology

## 2025-01-02 VITALS — BP 169/95 | HR 65 | Ht 75.0 in | Wt 229.0 lb

## 2025-01-02 DIAGNOSIS — N401 Enlarged prostate with lower urinary tract symptoms: Secondary | ICD-10-CM

## 2025-01-02 DIAGNOSIS — Z125 Encounter for screening for malignant neoplasm of prostate: Secondary | ICD-10-CM

## 2025-01-02 DIAGNOSIS — N138 Other obstructive and reflux uropathy: Secondary | ICD-10-CM

## 2025-01-02 DIAGNOSIS — R972 Elevated prostate specific antigen [PSA]: Secondary | ICD-10-CM | POA: Diagnosis not present

## 2025-01-02 LAB — BLADDER SCAN AMB NON-IMAGING

## 2025-01-02 MED ORDER — DUTASTERIDE 0.5 MG PO CAPS: 0.5000 mg | ORAL_CAPSULE | Freq: Every day | ORAL | 3 refills | Status: AC

## 2025-01-02 NOTE — Progress Notes (Signed)
" ° °  01/02/2025 11:39 AM   Keith Williamson 09-13-1946 [id number]  Reason for visit: Follow up BPH/LUTS, history of elevated PSA  HPI: 79 year old male who I originally met in January 2020 for to establish care after he previously was followed by Dr. Gala.  He is here again with his wife today who helps provide most of the history as he is very hard of hearing.  He has a history of elevated PSA as high as 14 which prompted a prostate MRI in December 2021, showing a 120 g gland with no worrisome lesions.  He underwent a greenlight laser PVP with Dr. Kassie for obstructive symptoms in January 2022, and he has been urinating well since that time.  He remains on dutasteride , and PSA has decreased appropriately on that medication.  Most recent PSA 2.7 from January 2025 which is stable from 2.5 prior.  Corrected for finasteride  equals 5.4, normal for age and with known history of BPH with history of benign MRI.  They are fairly adamant about continuing yearly PSA screening despite his age.  He denies any significant urinary symptoms or changes since our last visit.  PVR today is normal at 12ml.  He did have a culture documented UTI 12/17/2024 with symptoms of dysuria, this happened after he was treated for the flu.  This is the only UTI in the last year.  I think reasonable to defer further workup and evaluation with his stable urinary symptoms, normal emptying, and age and comorbidities  Continue finasteride , refilled Family strongly desires yearly PSA monitoring, reassurance provided RTC 1 year PVR, PSA prior  Keith JAYSON Burnet, MD  [facility] [address], [address] [place], [place] [address] [phone]   "

## 2025-01-02 NOTE — Patient Instructions (Signed)

## 2025-01-03 ENCOUNTER — Ambulatory Visit: Payer: Self-pay | Admitting: Urology

## 2026-01-02 ENCOUNTER — Ambulatory Visit: Admitting: Urology
